# Patient Record
Sex: Female | Born: 1975 | Race: White | Hispanic: No | Smoking: Current every day smoker
Health system: Southern US, Community
[De-identification: ages and names within clinical notes are randomized; demographics above are authoritative.]

## PROBLEM LIST (undated history)

## (undated) DIAGNOSIS — F1910 Other psychoactive substance abuse, uncomplicated: Secondary | ICD-10-CM

## (undated) DIAGNOSIS — F329 Major depressive disorder, single episode, unspecified: Secondary | ICD-10-CM

## (undated) DIAGNOSIS — F909 Attention-deficit hyperactivity disorder, unspecified type: Secondary | ICD-10-CM

## (undated) DIAGNOSIS — F419 Anxiety disorder, unspecified: Secondary | ICD-10-CM

## (undated) DIAGNOSIS — K3184 Gastroparesis: Secondary | ICD-10-CM

## (undated) DIAGNOSIS — F32A Depression, unspecified: Secondary | ICD-10-CM

## (undated) HISTORY — PX: CHOLECYSTECTOMY: SHX55

## (undated) HISTORY — PX: TUBAL LIGATION: SHX77

---

## 1998-04-02 ENCOUNTER — Emergency Department (HOSPITAL_COMMUNITY): Admission: EM | Admit: 1998-04-02 | Discharge: 1998-04-02 | Payer: Self-pay | Admitting: Emergency Medicine

## 1998-04-14 ENCOUNTER — Emergency Department (HOSPITAL_COMMUNITY): Admission: EM | Admit: 1998-04-14 | Discharge: 1998-04-14 | Payer: Self-pay | Admitting: *Deleted

## 1998-04-15 ENCOUNTER — Observation Stay (HOSPITAL_COMMUNITY): Admission: AD | Admit: 1998-04-15 | Discharge: 1998-04-16 | Payer: Self-pay | Admitting: *Deleted

## 1998-11-21 ENCOUNTER — Other Ambulatory Visit: Admission: RE | Admit: 1998-11-21 | Discharge: 1998-11-21 | Payer: Self-pay | Admitting: Obstetrics and Gynecology

## 1999-04-25 ENCOUNTER — Inpatient Hospital Stay (HOSPITAL_COMMUNITY): Admission: AD | Admit: 1999-04-25 | Discharge: 1999-04-25 | Payer: Self-pay | Admitting: Obstetrics and Gynecology

## 1999-05-13 ENCOUNTER — Inpatient Hospital Stay (HOSPITAL_COMMUNITY): Admission: AD | Admit: 1999-05-13 | Discharge: 1999-05-15 | Payer: Self-pay | Admitting: Obstetrics and Gynecology

## 1999-06-19 ENCOUNTER — Other Ambulatory Visit: Admission: RE | Admit: 1999-06-19 | Discharge: 1999-06-19 | Payer: Self-pay | Admitting: Obstetrics and Gynecology

## 2000-06-18 ENCOUNTER — Other Ambulatory Visit: Admission: RE | Admit: 2000-06-18 | Discharge: 2000-06-18 | Payer: Self-pay | Admitting: Obstetrics and Gynecology

## 2000-06-22 ENCOUNTER — Encounter: Payer: Self-pay | Admitting: Internal Medicine

## 2000-06-22 ENCOUNTER — Emergency Department (HOSPITAL_COMMUNITY): Admission: EM | Admit: 2000-06-22 | Discharge: 2000-06-22 | Payer: Self-pay | Admitting: Internal Medicine

## 2001-02-16 ENCOUNTER — Encounter: Payer: Self-pay | Admitting: Emergency Medicine

## 2001-02-16 ENCOUNTER — Emergency Department (HOSPITAL_COMMUNITY): Admission: EM | Admit: 2001-02-16 | Discharge: 2001-02-16 | Payer: Self-pay | Admitting: Emergency Medicine

## 2001-12-09 ENCOUNTER — Emergency Department (HOSPITAL_COMMUNITY): Admission: EM | Admit: 2001-12-09 | Discharge: 2001-12-09 | Payer: Self-pay | Admitting: Emergency Medicine

## 2002-02-21 ENCOUNTER — Emergency Department (HOSPITAL_COMMUNITY): Admission: EM | Admit: 2002-02-21 | Discharge: 2002-02-21 | Payer: Self-pay | Admitting: Emergency Medicine

## 2002-05-10 ENCOUNTER — Emergency Department (HOSPITAL_COMMUNITY): Admission: EM | Admit: 2002-05-10 | Discharge: 2002-05-10 | Payer: Self-pay | Admitting: Emergency Medicine

## 2002-06-21 ENCOUNTER — Emergency Department (HOSPITAL_COMMUNITY): Admission: EM | Admit: 2002-06-21 | Discharge: 2002-06-21 | Payer: Self-pay | Admitting: Emergency Medicine

## 2002-06-21 ENCOUNTER — Encounter: Payer: Self-pay | Admitting: Emergency Medicine

## 2002-11-09 ENCOUNTER — Other Ambulatory Visit: Admission: RE | Admit: 2002-11-09 | Discharge: 2002-11-09 | Payer: Self-pay | Admitting: Obstetrics and Gynecology

## 2003-03-08 ENCOUNTER — Inpatient Hospital Stay (HOSPITAL_COMMUNITY): Admission: AD | Admit: 2003-03-08 | Discharge: 2003-03-08 | Payer: Self-pay | Admitting: Obstetrics and Gynecology

## 2003-05-16 ENCOUNTER — Encounter: Payer: Self-pay | Admitting: *Deleted

## 2003-05-16 ENCOUNTER — Ambulatory Visit (HOSPITAL_COMMUNITY): Admission: RE | Admit: 2003-05-16 | Discharge: 2003-05-16 | Payer: Self-pay | Admitting: *Deleted

## 2003-05-23 ENCOUNTER — Inpatient Hospital Stay (HOSPITAL_COMMUNITY): Admission: AD | Admit: 2003-05-23 | Discharge: 2003-05-23 | Payer: Self-pay | Admitting: *Deleted

## 2003-05-28 ENCOUNTER — Inpatient Hospital Stay (HOSPITAL_COMMUNITY): Admission: AD | Admit: 2003-05-28 | Discharge: 2003-05-28 | Payer: Self-pay | Admitting: Obstetrics and Gynecology

## 2003-06-02 ENCOUNTER — Inpatient Hospital Stay (HOSPITAL_COMMUNITY): Admission: AD | Admit: 2003-06-02 | Discharge: 2003-06-04 | Payer: Self-pay | Admitting: Obstetrics and Gynecology

## 2003-06-03 ENCOUNTER — Encounter (INDEPENDENT_AMBULATORY_CARE_PROVIDER_SITE_OTHER): Payer: Self-pay | Admitting: *Deleted

## 2003-07-17 ENCOUNTER — Other Ambulatory Visit: Admission: RE | Admit: 2003-07-17 | Discharge: 2003-07-17 | Payer: Self-pay | Admitting: Obstetrics and Gynecology

## 2004-08-30 ENCOUNTER — Other Ambulatory Visit: Admission: RE | Admit: 2004-08-30 | Discharge: 2004-08-30 | Payer: Self-pay | Admitting: Obstetrics and Gynecology

## 2006-04-30 ENCOUNTER — Emergency Department (HOSPITAL_COMMUNITY): Admission: EM | Admit: 2006-04-30 | Discharge: 2006-04-30 | Payer: Self-pay | Admitting: Emergency Medicine

## 2009-01-10 ENCOUNTER — Emergency Department (HOSPITAL_COMMUNITY): Admission: EM | Admit: 2009-01-10 | Discharge: 2009-01-10 | Payer: Self-pay | Admitting: Emergency Medicine

## 2009-01-17 ENCOUNTER — Emergency Department (HOSPITAL_COMMUNITY): Admission: EM | Admit: 2009-01-17 | Discharge: 2009-01-17 | Payer: Self-pay | Admitting: Family Medicine

## 2010-01-15 ENCOUNTER — Emergency Department (HOSPITAL_COMMUNITY): Admission: EM | Admit: 2010-01-15 | Discharge: 2010-01-15 | Payer: Self-pay | Admitting: Family Medicine

## 2010-02-22 ENCOUNTER — Emergency Department (HOSPITAL_COMMUNITY): Admission: EM | Admit: 2010-02-22 | Discharge: 2010-02-22 | Payer: Self-pay | Admitting: Emergency Medicine

## 2010-02-24 ENCOUNTER — Emergency Department (HOSPITAL_COMMUNITY): Admission: EM | Admit: 2010-02-24 | Discharge: 2010-02-25 | Payer: Self-pay | Admitting: Emergency Medicine

## 2010-03-24 ENCOUNTER — Emergency Department (HOSPITAL_COMMUNITY): Admission: EM | Admit: 2010-03-24 | Discharge: 2010-03-24 | Payer: Self-pay | Admitting: Emergency Medicine

## 2010-03-30 ENCOUNTER — Emergency Department (HOSPITAL_COMMUNITY): Admission: EM | Admit: 2010-03-30 | Discharge: 2010-03-30 | Payer: Self-pay | Admitting: Emergency Medicine

## 2010-04-04 ENCOUNTER — Emergency Department (HOSPITAL_COMMUNITY): Admission: EM | Admit: 2010-04-04 | Discharge: 2010-04-04 | Payer: Self-pay | Admitting: Emergency Medicine

## 2010-05-03 ENCOUNTER — Emergency Department (HOSPITAL_COMMUNITY): Admission: EM | Admit: 2010-05-03 | Discharge: 2010-05-03 | Payer: Self-pay | Admitting: Emergency Medicine

## 2010-05-04 ENCOUNTER — Ambulatory Visit: Payer: Self-pay | Admitting: Psychiatry

## 2010-05-04 ENCOUNTER — Inpatient Hospital Stay (HOSPITAL_COMMUNITY): Admission: AD | Admit: 2010-05-04 | Discharge: 2010-05-08 | Payer: Self-pay | Admitting: Psychiatry

## 2010-07-04 ENCOUNTER — Inpatient Hospital Stay (HOSPITAL_COMMUNITY): Admission: EM | Admit: 2010-07-04 | Discharge: 2010-07-05 | Payer: Self-pay | Admitting: Emergency Medicine

## 2010-07-05 ENCOUNTER — Encounter (INDEPENDENT_AMBULATORY_CARE_PROVIDER_SITE_OTHER): Payer: Self-pay | Admitting: General Surgery

## 2010-08-06 ENCOUNTER — Emergency Department (HOSPITAL_COMMUNITY)
Admission: EM | Admit: 2010-08-06 | Discharge: 2010-08-06 | Payer: Self-pay | Source: Home / Self Care | Admitting: Emergency Medicine

## 2010-10-28 LAB — URINE MICROSCOPIC-ADD ON

## 2010-10-28 LAB — BASIC METABOLIC PANEL
BUN: 2 mg/dL — ABNORMAL LOW (ref 6–23)
CO2: 26 mEq/L (ref 19–32)
Calcium: 9.3 mg/dL (ref 8.4–10.5)
Creatinine, Ser: 0.84 mg/dL (ref 0.4–1.2)
GFR calc Af Amer: >60 mL/min (ref >60)

## 2010-10-28 LAB — URINALYSIS, ROUTINE W REFLEX MICROSCOPIC
Glucose, UA: NEGATIVE mg/dL
Hgb urine dipstick: NEGATIVE
Ketones, ur: NEGATIVE mg/dL
Nitrite: NEGATIVE
Specific Gravity, Urine: 1.025 (ref 1.005–1.030)

## 2010-10-28 LAB — RAPID URINE DRUG SCREEN, HOSP PERFORMED
Barbiturates: NOT DETECTED
Benzodiazepines: POSITIVE — AB
Tetrahydrocannabinol: POSITIVE — AB

## 2010-10-28 LAB — DIFFERENTIAL
Basophils Relative: 0 % (ref 0–1)
Eosinophils Absolute: 0.1 10*3/uL (ref 0.0–0.7)
Eosinophils Relative: 1 % (ref 0–5)
Lymphs Abs: 3.4 10*3/uL (ref 0.7–4.0)
Monocytes Absolute: 0.6 10*3/uL (ref 0.1–1.0)
Neutro Abs: 6.1 10*3/uL (ref 1.7–7.7)

## 2010-10-28 LAB — CBC
MCH: 30.9 pg (ref 26.0–34.0)
MCHC: 34.7 g/dL (ref 30.0–36.0)
RDW: 12.8 % (ref 11.5–15.5)

## 2010-10-28 LAB — ETHANOL: Alcohol, Ethyl (B): <5 mg/dL (ref 0–10)

## 2010-10-29 LAB — DIFFERENTIAL
Basophils Absolute: 0.1 10*3/uL (ref 0.0–0.1)
Basophils Relative: 1 % (ref 0–1)
Basophils Relative: 1 % (ref 0–1)
Lymphs Abs: 3.1 10*3/uL (ref 0.7–4.0)
Monocytes Absolute: 0.7 10*3/uL (ref 0.1–1.0)
Monocytes Absolute: 0.7 10*3/uL (ref 0.1–1.0)
Monocytes Relative: 6 % (ref 3–12)
Neutro Abs: 5.8 10*3/uL (ref 1.7–7.7)
Neutro Abs: 6.6 10*3/uL (ref 1.7–7.7)
Neutrophils Relative %: 63 % (ref 43–77)

## 2010-10-29 LAB — BASIC METABOLIC PANEL
BUN: 6 mg/dL (ref 6–23)
Calcium: 8.6 mg/dL (ref 8.4–10.5)
Creatinine, Ser: 0.77 mg/dL (ref 0.4–1.2)
GFR calc Af Amer: >60 mL/min (ref >60)
GFR calc non Af Amer: >60 mL/min (ref >60)
Potassium: 4.3 mEq/L (ref 3.5–5.1)

## 2010-10-29 LAB — CBC
HCT: 34.2 % — ABNORMAL LOW (ref 36.0–46.0)
HCT: 38 % (ref 36.0–46.0)
MCH: 31 pg (ref 26.0–34.0)
MCH: 31.2 pg (ref 26.0–34.0)
MCHC: 33 g/dL (ref 30.0–36.0)
MCV: 94 fL (ref 78.0–100.0)
Platelets: 249 10*3/uL (ref 150–400)
RDW: 13.4 % (ref 11.5–15.5)
WBC: 10.6 10*3/uL — ABNORMAL HIGH (ref 4.0–10.5)

## 2010-10-29 LAB — COMPREHENSIVE METABOLIC PANEL
ALT: 33 U/L (ref 0–35)
AST: 24 U/L (ref 0–37)
Alkaline Phosphatase: 65 U/L (ref 39–117)
Chloride: 98 mEq/L (ref 96–112)
GFR calc Af Amer: >60 mL/min (ref >60)
Sodium: 137 mEq/L (ref 135–145)

## 2010-10-29 LAB — URINALYSIS, ROUTINE W REFLEX MICROSCOPIC
Protein, ur: NEGATIVE mg/dL
Specific Gravity, Urine: 1.01 (ref 1.005–1.030)
pH: 5.5 (ref 5.0–8.0)

## 2010-10-29 LAB — LIPASE, BLOOD: Lipase: 25 U/L (ref 11–59)

## 2010-10-29 LAB — URINE MICROSCOPIC-ADD ON

## 2010-10-31 LAB — POCT I-STAT, CHEM 8
BUN: 5 mg/dL — ABNORMAL LOW (ref 6–23)
Calcium, Ion: 1.22 mmol/L (ref 1.12–1.32)
Creatinine, Ser: 0.9 mg/dL (ref 0.4–1.2)
TCO2: 29 mmol/L (ref 0–100)

## 2010-10-31 LAB — RAPID URINE DRUG SCREEN, HOSP PERFORMED
Barbiturates: NOT DETECTED
Benzodiazepines: POSITIVE — AB
Cocaine: POSITIVE — AB

## 2010-11-01 LAB — COMPREHENSIVE METABOLIC PANEL
ALT: 21 U/L (ref 0–35)
AST: 21 U/L (ref 0–37)
Albumin: 3.6 g/dL (ref 3.5–5.2)
Alkaline Phosphatase: 67 U/L (ref 39–117)
Calcium: 8.7 mg/dL (ref 8.4–10.5)
GFR calc Af Amer: >60 mL/min (ref >60)
Glucose, Bld: 101 mg/dL — ABNORMAL HIGH (ref 70–99)
Potassium: 3.9 mEq/L (ref 3.5–5.1)
Sodium: 138 mEq/L (ref 135–145)
Total Protein: 6.5 g/dL (ref 6.0–8.3)

## 2010-11-01 LAB — CBC
HCT: 36.2 % (ref 36.0–46.0)
RDW: 13.4 % (ref 11.5–15.5)
WBC: 7.9 10*3/uL (ref 4.0–10.5)

## 2010-11-01 LAB — URINALYSIS, ROUTINE W REFLEX MICROSCOPIC
Bilirubin Urine: NEGATIVE
Glucose, UA: NEGATIVE mg/dL
Ketones, ur: NEGATIVE mg/dL
Specific Gravity, Urine: 1.016 (ref 1.005–1.030)
pH: 5.5 (ref 5.0–8.0)

## 2010-11-01 LAB — URINE MICROSCOPIC-ADD ON

## 2010-11-01 LAB — DIFFERENTIAL
Basophils Relative: 1 % (ref 0–1)
Eosinophils Absolute: 0.1 10*3/uL (ref 0.0–0.7)
Eosinophils Relative: 2 % (ref 0–5)
Lymphs Abs: 3.2 10*3/uL (ref 0.7–4.0)
Monocytes Absolute: 0.5 10*3/uL (ref 0.1–1.0)
Monocytes Relative: 7 % (ref 3–12)

## 2010-11-03 LAB — DIFFERENTIAL
Basophils Absolute: 0 10*3/uL (ref 0.0–0.1)
Basophils Relative: 0 % (ref 0–1)
Eosinophils Absolute: 0.1 10*3/uL (ref 0.0–0.7)
Eosinophils Relative: 1 % (ref 0–5)
Monocytes Absolute: 0.8 10*3/uL (ref 0.1–1.0)

## 2010-11-03 LAB — URINALYSIS, ROUTINE W REFLEX MICROSCOPIC
Bilirubin Urine: NEGATIVE
Bilirubin Urine: NEGATIVE
Glucose, UA: NEGATIVE mg/dL
Hgb urine dipstick: NEGATIVE
Ketones, ur: NEGATIVE mg/dL
Ketones, ur: NEGATIVE mg/dL
Nitrite: NEGATIVE
Nitrite: NEGATIVE
Specific Gravity, Urine: 1.012 (ref 1.005–1.030)
Specific Gravity, Urine: 1.021 (ref 1.005–1.030)
Urobilinogen, UA: 0.2 mg/dL (ref 0.0–1.0)
pH: 7 (ref 5.0–8.0)
pH: 7 (ref 5.0–8.0)

## 2010-11-03 LAB — URINE MICROSCOPIC-ADD ON

## 2010-11-03 LAB — COMPREHENSIVE METABOLIC PANEL
ALT: 71 U/L — ABNORMAL HIGH (ref 0–35)
AST: 43 U/L — ABNORMAL HIGH (ref 0–37)
Albumin: 3.7 g/dL (ref 3.5–5.2)
Alkaline Phosphatase: 59 U/L (ref 39–117)
CO2: 32 mEq/L (ref 19–32)
Chloride: 99 mEq/L (ref 96–112)
GFR calc Af Amer: >60 mL/min (ref >60)
GFR calc non Af Amer: >60 mL/min (ref >60)
Potassium: 3.1 mEq/L — ABNORMAL LOW (ref 3.5–5.1)
Total Bilirubin: 0.7 mg/dL (ref 0.3–1.2)

## 2010-11-03 LAB — POCT PREGNANCY, URINE
Preg Test, Ur: NEGATIVE
Preg Test, Ur: NEGATIVE

## 2010-11-03 LAB — CBC
Hemoglobin: 14.6 g/dL (ref 12.0–15.0)
Platelets: 269 10*3/uL (ref 150–400)
RBC: 4.57 MIL/uL (ref 3.87–5.11)
WBC: 11.6 10*3/uL — ABNORMAL HIGH (ref 4.0–10.5)

## 2010-11-08 ENCOUNTER — Emergency Department (HOSPITAL_COMMUNITY)
Admission: EM | Admit: 2010-11-08 | Discharge: 2010-11-09 | Disposition: A | Discharge status: Home / Self Care | Payer: Self-pay | Attending: Emergency Medicine | Admitting: Emergency Medicine

## 2010-11-08 DIAGNOSIS — F319 Bipolar disorder, unspecified: Secondary | ICD-10-CM | POA: Insufficient documentation

## 2010-11-08 DIAGNOSIS — L989 Disorder of the skin and subcutaneous tissue, unspecified: Secondary | ICD-10-CM | POA: Insufficient documentation

## 2010-11-08 DIAGNOSIS — L02419 Cutaneous abscess of limb, unspecified: Secondary | ICD-10-CM | POA: Insufficient documentation

## 2010-11-08 DIAGNOSIS — L03119 Cellulitis of unspecified part of limb: Secondary | ICD-10-CM | POA: Insufficient documentation

## 2010-11-08 DIAGNOSIS — F411 Generalized anxiety disorder: Secondary | ICD-10-CM | POA: Insufficient documentation

## 2010-11-08 DIAGNOSIS — E785 Hyperlipidemia, unspecified: Secondary | ICD-10-CM | POA: Insufficient documentation

## 2010-11-11 LAB — WOUND CULTURE

## 2010-11-12 ENCOUNTER — Emergency Department (HOSPITAL_COMMUNITY): Payer: Self-pay

## 2010-11-12 ENCOUNTER — Emergency Department (HOSPITAL_COMMUNITY)
Admission: EM | Admit: 2010-11-12 | Discharge: 2010-11-12 | Disposition: A | Discharge status: Home / Self Care | Payer: Self-pay | Attending: Emergency Medicine | Admitting: Emergency Medicine

## 2010-11-12 DIAGNOSIS — L02419 Cutaneous abscess of limb, unspecified: Secondary | ICD-10-CM | POA: Insufficient documentation

## 2010-11-12 DIAGNOSIS — M79609 Pain in unspecified limb: Secondary | ICD-10-CM | POA: Insufficient documentation

## 2010-11-12 DIAGNOSIS — Z79899 Other long term (current) drug therapy: Secondary | ICD-10-CM | POA: Insufficient documentation

## 2010-11-12 DIAGNOSIS — E785 Hyperlipidemia, unspecified: Secondary | ICD-10-CM | POA: Insufficient documentation

## 2010-11-12 DIAGNOSIS — T6391XA Toxic effect of contact with unspecified venomous animal, accidental (unintentional), initial encounter: Secondary | ICD-10-CM | POA: Insufficient documentation

## 2010-11-12 DIAGNOSIS — T63391A Toxic effect of venom of other spider, accidental (unintentional), initial encounter: Secondary | ICD-10-CM | POA: Insufficient documentation

## 2010-11-12 DIAGNOSIS — F319 Bipolar disorder, unspecified: Secondary | ICD-10-CM | POA: Insufficient documentation

## 2010-11-20 ENCOUNTER — Emergency Department (HOSPITAL_COMMUNITY)
Admission: EM | Admit: 2010-11-20 | Discharge: 2010-11-21 | Disposition: A | Discharge status: Home / Self Care | Payer: No Typology Code available for payment source | Attending: Emergency Medicine | Admitting: Emergency Medicine

## 2010-11-20 DIAGNOSIS — R51 Headache: Secondary | ICD-10-CM | POA: Insufficient documentation

## 2010-11-20 DIAGNOSIS — M549 Dorsalgia, unspecified: Secondary | ICD-10-CM | POA: Insufficient documentation

## 2010-11-20 DIAGNOSIS — F988 Other specified behavioral and emotional disorders with onset usually occurring in childhood and adolescence: Secondary | ICD-10-CM | POA: Insufficient documentation

## 2010-12-18 ENCOUNTER — Emergency Department (HOSPITAL_COMMUNITY)
Admission: EM | Admit: 2010-12-18 | Discharge: 2010-12-18 | Disposition: A | Discharge status: Home / Self Care | Payer: No Typology Code available for payment source | Attending: Emergency Medicine | Admitting: Emergency Medicine

## 2010-12-18 DIAGNOSIS — F988 Other specified behavioral and emotional disorders with onset usually occurring in childhood and adolescence: Secondary | ICD-10-CM | POA: Insufficient documentation

## 2010-12-18 DIAGNOSIS — IMO0002 Reserved for concepts with insufficient information to code with codable children: Secondary | ICD-10-CM | POA: Insufficient documentation

## 2010-12-18 DIAGNOSIS — M7989 Other specified soft tissue disorders: Secondary | ICD-10-CM | POA: Insufficient documentation

## 2010-12-18 DIAGNOSIS — Z79899 Other long term (current) drug therapy: Secondary | ICD-10-CM | POA: Insufficient documentation

## 2010-12-18 DIAGNOSIS — M79609 Pain in unspecified limb: Secondary | ICD-10-CM | POA: Insufficient documentation

## 2011-01-03 NOTE — Op Note (Signed)
   NAME:  Amanda Carrillo, VARANO                     ACCOUNT NO.:  [id number]   MEDICAL RECORD NO.:  [id number]                   PATIENT TYPE:  INP   LOCATION:  9115                                 FACILITY:  [facility]   PHYSICIAN:  Duke Salvia. Marcelle Overlie, M.D.            DATE OF BIRTH:  [birth year]   DATE OF PROCEDURE:  06/03/2003  DATE OF DISCHARGE:                                 OPERATIVE REPORT   PREOPERATIVE DIAGNOSIS:  Requests permanent sterilization.   POSTOPERATIVE DIAGNOSIS:  Requests permanent sterilization.   PROCEDURE:  Modified [place] postpartum tubal ligation.   SURGEON:  Duke Salvia. Marcelle Overlie, M.D.   ANESTHESIA:  Epidural plus local.   COMPLICATIONS:  None.   ESTIMATED BLOOD LOSS:  Less than 5 mL.   PROCEDURE AND FINDINGS:  The patient taken to the operating room.  After an  adequate level of epidural anesthesia was obtained, the periumbilical area  was prepped and draped.  This was supplemented with 0.5% Marcaine plain into  the subumbilical area.  A small incision was made between two Allis clamps,  carried down through fascia and peritoneum individually.  Army[employer]y  retractors were then positioned.  Babcock clamps used to isolate the right  tube, which was traced to the fimbriated end, re-grasped in the midsegment.  Marcaine 0.5% 4-5 mL was then dripped across the tube.  A knuckle of tube  was then resected after tying with 0 plain ties.  Cut ends were cauterized  with the Bovie and a 2-0 silk suture placed around the proximal segment.  This was hemostatic.  The exact same was repeated on the opposite side after  carefully identifying the tube.  These areas were hemostatic.  Prior to  closure, the sponge, needle, and instrument counts were reported as correct  x2.  The fascia was closed with a 2-0 Dexon running suture and a 4-0 Vicryl  Rapide on the subcuticular.  She tolerated this well and went to the  recovery room in good condition.                  Richard M. Marcelle Overlie, M.D.    RMH/MEDQ  D:  06/03/2003  T:  06/03/2003  Job:  [id number]

## 2011-02-21 ENCOUNTER — Emergency Department (HOSPITAL_COMMUNITY)
Admission: EM | Admit: 2011-02-21 | Discharge: 2011-02-22 | Disposition: A | Discharge status: Home / Self Care | Payer: Self-pay | Attending: Emergency Medicine | Admitting: Emergency Medicine

## 2011-02-21 DIAGNOSIS — R109 Unspecified abdominal pain: Secondary | ICD-10-CM | POA: Insufficient documentation

## 2011-02-21 DIAGNOSIS — R112 Nausea with vomiting, unspecified: Secondary | ICD-10-CM | POA: Insufficient documentation

## 2011-02-21 LAB — DIFFERENTIAL
Eosinophils Absolute: 0 10*3/uL (ref 0.0–0.7)
Lymphs Abs: 2.1 10*3/uL (ref 0.7–4.0)
Neutro Abs: 11.6 10*3/uL — ABNORMAL HIGH (ref 1.7–7.7)
Neutrophils Relative %: 80 % — ABNORMAL HIGH (ref 43–77)

## 2011-02-21 LAB — CBC
Hemoglobin: 12.8 g/dL (ref 12.0–15.0)
MCV: 89.7 fL (ref 78.0–100.0)
Platelets: 205 10*3/uL (ref 150–400)
RBC: 4.35 MIL/uL (ref 3.87–5.11)
WBC: 14.5 10*3/uL — ABNORMAL HIGH (ref 4.0–10.5)

## 2011-02-22 ENCOUNTER — Emergency Department (HOSPITAL_COMMUNITY): Payer: Self-pay

## 2011-02-22 ENCOUNTER — Emergency Department (HOSPITAL_COMMUNITY)
Admission: EM | Admit: 2011-02-22 | Discharge: 2011-02-22 | Disposition: A | Discharge status: Home / Self Care | Payer: Self-pay | Attending: Emergency Medicine | Admitting: Emergency Medicine

## 2011-02-22 DIAGNOSIS — F19939 Other psychoactive substance use, unspecified with withdrawal, unspecified: Secondary | ICD-10-CM | POA: Insufficient documentation

## 2011-02-22 DIAGNOSIS — R109 Unspecified abdominal pain: Secondary | ICD-10-CM | POA: Insufficient documentation

## 2011-02-22 DIAGNOSIS — R112 Nausea with vomiting, unspecified: Secondary | ICD-10-CM | POA: Insufficient documentation

## 2011-02-22 DIAGNOSIS — F1920 Other psychoactive substance dependence, uncomplicated: Secondary | ICD-10-CM | POA: Insufficient documentation

## 2011-02-22 LAB — COMPREHENSIVE METABOLIC PANEL
ALT: 30 U/L (ref 0–35)
AST: 14 U/L (ref 0–37)
AST: 19 U/L (ref 0–37)
Albumin: 3.5 g/dL (ref 3.5–5.2)
Albumin: 3.3 g/dL — ABNORMAL LOW (ref 3.5–5.2)
Alkaline Phosphatase: 70 U/L (ref 39–117)
CO2: 27 mEq/L (ref 19–32)
Calcium: 8.1 mg/dL — ABNORMAL LOW (ref 8.4–10.5)
Chloride: 105 mEq/L (ref 96–112)
Creatinine, Ser: 0.7 mg/dL (ref 0.50–1.10)
GFR calc non Af Amer: >60 mL/min (ref >60)
Potassium: 3.1 mEq/L — ABNORMAL LOW (ref 3.5–5.1)
Sodium: 139 mEq/L (ref 135–145)
Sodium: 134 mEq/L — ABNORMAL LOW (ref 135–145)
Total Bilirubin: 0.5 mg/dL (ref 0.3–1.2)
Total Protein: 6.6 g/dL (ref 6.0–8.3)
Total Protein: 6.5 g/dL (ref 6.0–8.3)

## 2011-02-22 LAB — URINALYSIS, ROUTINE W REFLEX MICROSCOPIC
Glucose, UA: NEGATIVE mg/dL
Ketones, ur: 40 mg/dL — AB
Ketones, ur: 15 mg/dL — AB
Leukocytes, UA: NEGATIVE
Leukocytes, UA: NEGATIVE
Nitrite: NEGATIVE
Nitrite: NEGATIVE
Protein, ur: NEGATIVE mg/dL
Specific Gravity, Urine: 1.025 (ref 1.005–1.030)
Urobilinogen, UA: 0.2 mg/dL (ref 0.0–1.0)
pH: 6 (ref 5.0–8.0)

## 2011-02-22 LAB — DIFFERENTIAL
Basophils Absolute: 0 10*3/uL (ref 0.0–0.1)
Basophils Relative: 0 % (ref 0–1)
Eosinophils Absolute: 0 10*3/uL (ref 0.0–0.7)
Monocytes Absolute: 0.8 10*3/uL (ref 0.1–1.0)
Monocytes Relative: 6 % (ref 3–12)
Neutro Abs: 10 10*3/uL — ABNORMAL HIGH (ref 1.7–7.7)
Neutrophils Relative %: 76 % (ref 43–77)

## 2011-02-22 LAB — CBC
Hemoglobin: 13.3 g/dL (ref 12.0–15.0)
MCH: 30.2 pg (ref 26.0–34.0)
MCHC: 33.6 g/dL (ref 30.0–36.0)
Platelets: 189 10*3/uL (ref 150–400)

## 2011-02-22 LAB — WET PREP, GENITAL: Trich, Wet Prep: NONE SEEN

## 2011-02-22 LAB — LIPASE, BLOOD: Lipase: 19 U/L (ref 11–59)

## 2011-02-22 MED ORDER — IOHEXOL 300 MG/ML  SOLN
125.0000 mL | Freq: Once | INTRAMUSCULAR | Status: AC | PRN
  Administered 2011-02-22: 125 mL via INTRAVENOUS

## 2011-02-23 ENCOUNTER — Emergency Department (HOSPITAL_COMMUNITY): Payer: Self-pay

## 2011-02-23 ENCOUNTER — Emergency Department (HOSPITAL_COMMUNITY)
Admission: EM | Admit: 2011-02-23 | Discharge: 2011-02-23 | Disposition: A | Discharge status: Home / Self Care | Payer: Self-pay | Attending: Emergency Medicine | Admitting: Emergency Medicine

## 2011-02-23 DIAGNOSIS — R112 Nausea with vomiting, unspecified: Secondary | ICD-10-CM | POA: Insufficient documentation

## 2011-02-23 DIAGNOSIS — E876 Hypokalemia: Secondary | ICD-10-CM | POA: Insufficient documentation

## 2011-02-23 DIAGNOSIS — R109 Unspecified abdominal pain: Secondary | ICD-10-CM | POA: Insufficient documentation

## 2011-02-23 LAB — DIFFERENTIAL
Basophils Absolute: 0 10*3/uL (ref 0.0–0.1)
Basophils Relative: 0 % (ref 0–1)
Eosinophils Absolute: 0 10*3/uL (ref 0.0–0.7)
Eosinophils Relative: 0 % (ref 0–5)
Lymphocytes Relative: 23 % (ref 12–46)
Lymphs Abs: 2.7 10*3/uL (ref 0.7–4.0)
Monocytes Absolute: 0.8 10*3/uL (ref 0.1–1.0)
Monocytes Relative: 6 % (ref 3–12)
Neutro Abs: 8.2 10*3/uL — ABNORMAL HIGH (ref 1.7–7.7)
Neutrophils Relative %: 70 % (ref 43–77)

## 2011-02-23 LAB — URINALYSIS, ROUTINE W REFLEX MICROSCOPIC
Bilirubin Urine: NEGATIVE
Glucose, UA: NEGATIVE mg/dL
Hgb urine dipstick: NEGATIVE
Ketones, ur: 15 mg/dL — AB
Leukocytes, UA: NEGATIVE
Nitrite: NEGATIVE
Protein, ur: NEGATIVE mg/dL
Specific Gravity, Urine: 1.013 (ref 1.005–1.030)
Urobilinogen, UA: 0.2 mg/dL (ref 0.0–1.0)
pH: 6.5 (ref 5.0–8.0)

## 2011-02-23 LAB — CBC
Hemoglobin: 14.4 g/dL (ref 12.0–15.0)
MCV: 87.6 fL (ref 78.0–100.0)
Platelets: 227 10*3/uL (ref 150–400)
RBC: 4.74 MIL/uL (ref 3.87–5.11)
WBC: 11.7 10*3/uL — ABNORMAL HIGH (ref 4.0–10.5)

## 2011-02-23 LAB — COMPREHENSIVE METABOLIC PANEL
ALT: 85 U/L — ABNORMAL HIGH (ref 0–35)
AST: 62 U/L — ABNORMAL HIGH (ref 0–37)
CO2: 26 mEq/L (ref 19–32)
Chloride: 97 mEq/L (ref 96–112)
GFR calc non Af Amer: >60 mL/min (ref >60)
Sodium: 135 mEq/L (ref 135–145)
Total Bilirubin: 0.8 mg/dL (ref 0.3–1.2)

## 2011-02-24 ENCOUNTER — Inpatient Hospital Stay (HOSPITAL_COMMUNITY)
Admission: EM | Admit: 2011-02-24 | Discharge: 2011-02-26 | DRG: 392 | Disposition: A | Discharge status: Home / Self Care | Payer: Self-pay | Attending: Internal Medicine | Admitting: Internal Medicine

## 2011-02-24 DIAGNOSIS — F988 Other specified behavioral and emotional disorders with onset usually occurring in childhood and adolescence: Secondary | ICD-10-CM | POA: Diagnosis present

## 2011-02-24 DIAGNOSIS — F1411 Cocaine abuse, in remission: Secondary | ICD-10-CM | POA: Diagnosis present

## 2011-02-24 DIAGNOSIS — K5289 Other specified noninfective gastroenteritis and colitis: Principal | ICD-10-CM | POA: Diagnosis present

## 2011-02-24 DIAGNOSIS — R109 Unspecified abdominal pain: Secondary | ICD-10-CM | POA: Diagnosis present

## 2011-02-24 DIAGNOSIS — F17200 Nicotine dependence, unspecified, uncomplicated: Secondary | ICD-10-CM | POA: Diagnosis present

## 2011-02-25 ENCOUNTER — Emergency Department (HOSPITAL_COMMUNITY): Payer: Self-pay

## 2011-02-25 LAB — COMPREHENSIVE METABOLIC PANEL
AST: 50 U/L — ABNORMAL HIGH (ref 0–37)
Albumin: 3.6 g/dL (ref 3.5–5.2)
Calcium: 8.8 mg/dL (ref 8.4–10.5)
Chloride: 99 mEq/L (ref 96–112)
Creatinine, Ser: 0.74 mg/dL (ref 0.50–1.10)
Total Bilirubin: 0.6 mg/dL (ref 0.3–1.2)
Total Protein: 6.9 g/dL (ref 6.0–8.3)

## 2011-02-25 LAB — CBC
MCV: 86.9 fL (ref 78.0–100.0)
Platelets: 235 10*3/uL (ref 150–400)
RDW: 13.3 % (ref 11.5–15.5)
WBC: 13.3 10*3/uL — ABNORMAL HIGH (ref 4.0–10.5)

## 2011-02-25 LAB — URINE MICROSCOPIC-ADD ON

## 2011-02-25 LAB — URINALYSIS, ROUTINE W REFLEX MICROSCOPIC
Glucose, UA: NEGATIVE mg/dL
Specific Gravity, Urine: 1.017 (ref 1.005–1.030)
pH: 6.5 (ref 5.0–8.0)

## 2011-02-25 LAB — TSH: TSH: 5.9 u[IU]/mL — ABNORMAL HIGH (ref 0.350–4.500)

## 2011-02-25 LAB — ETHANOL: Alcohol, Ethyl (B): <11 mg/dL (ref 0–11)

## 2011-02-25 LAB — DIFFERENTIAL
Eosinophils Absolute: 0.1 10*3/uL (ref 0.0–0.7)
Eosinophils Relative: 0 % (ref 0–5)
Lymphs Abs: 2.9 10*3/uL (ref 0.7–4.0)

## 2011-02-26 LAB — CBC
MCV: 87.3 fL (ref 78.0–100.0)
Platelets: 251 10*3/uL (ref 150–400)
RBC: 4.65 MIL/uL (ref 3.87–5.11)
RDW: 13.2 % (ref 11.5–15.5)
WBC: 9.6 10*3/uL (ref 4.0–10.5)

## 2011-02-26 LAB — URINE CULTURE
Colony Count: 75000
Culture  Setup Time: 201207100920

## 2011-02-28 ENCOUNTER — Emergency Department (HOSPITAL_COMMUNITY)
Admission: EM | Admit: 2011-02-28 | Discharge: 2011-02-28 | Disposition: A | Discharge status: Home / Self Care | Payer: Self-pay | Attending: Emergency Medicine | Admitting: Emergency Medicine

## 2011-02-28 DIAGNOSIS — F319 Bipolar disorder, unspecified: Secondary | ICD-10-CM | POA: Insufficient documentation

## 2011-02-28 DIAGNOSIS — F988 Other specified behavioral and emotional disorders with onset usually occurring in childhood and adolescence: Secondary | ICD-10-CM | POA: Insufficient documentation

## 2011-02-28 DIAGNOSIS — Z79899 Other long term (current) drug therapy: Secondary | ICD-10-CM | POA: Insufficient documentation

## 2011-02-28 DIAGNOSIS — R109 Unspecified abdominal pain: Secondary | ICD-10-CM | POA: Insufficient documentation

## 2011-02-28 LAB — COMPREHENSIVE METABOLIC PANEL
ALT: 48 U/L — ABNORMAL HIGH (ref 0–35)
AST: 16 U/L (ref 0–37)
Albumin: 3.6 g/dL (ref 3.5–5.2)
Calcium: 8.8 mg/dL (ref 8.4–10.5)
Creatinine, Ser: 0.56 mg/dL (ref 0.50–1.10)
GFR calc non Af Amer: >60 mL/min (ref >60)
Sodium: 137 mEq/L (ref 135–145)
Total Protein: 6.7 g/dL (ref 6.0–8.3)

## 2011-02-28 LAB — CBC
MCH: 31 pg (ref 26.0–34.0)
MCHC: 35.4 g/dL (ref 30.0–36.0)
MCV: 87.6 fL (ref 78.0–100.0)
Platelets: 244 10*3/uL (ref 150–400)
RBC: 4.45 MIL/uL (ref 3.87–5.11)
RDW: 13.3 % (ref 11.5–15.5)

## 2011-02-28 LAB — DIFFERENTIAL
Basophils Relative: 0 % (ref 0–1)
Eosinophils Absolute: 0.1 10*3/uL (ref 0.0–0.7)
Eosinophils Relative: 1 % (ref 0–5)
Lymphs Abs: 2.5 10*3/uL (ref 0.7–4.0)
Monocytes Absolute: 0.9 10*3/uL (ref 0.1–1.0)
Monocytes Relative: 9 % (ref 3–12)
Neutrophils Relative %: 62 % (ref 43–77)

## 2011-02-28 LAB — POCT PREGNANCY, URINE: Preg Test, Ur: NEGATIVE

## 2011-03-20 NOTE — H&P (Signed)
Amanda Carrillo, ESHAM NO.:  [id number]  MEDICAL RECORD NO.:  [id number]  LOCATION:  [facility]                         FACILITY:  [facility]  PHYSICIAN:  Zannie Cove, MD     DATE OF BIRTH:  29-Mar-1976  DATE OF ADMISSION:  02/24/2011 DATE OF DISCHARGE:                             HISTORY & PHYSICAL   PRIMARY CARE PHYSICIAN:  None.  She is unassigned.  CHIEF COMPLAINT:  Nausea and vomiting.  HISTORY OF PRESENT ILLNESS:  Amanda Carrillo is a 35 year old white female with prior history of heroin abuse who used to be on methadone until 2 weeks ago was here with the above-mentioned complaint.  The patient reports that she used to be on methadone for heroin dependence and then towards the end of June, she stopped taking the methadone because she could not afford it any more and has had intermittent symptoms of nausea and vomiting since stopping this, however, she did okay for about a week after being off methadone after initial 2-3 days of symptoms and this Friday started having severe nausea and vomiting which she describes as pretty much constant with 10-15 times a day without any blood in vomitus.  Her vomitus contains food or clear liquids or greenish bile sometimes.  She also reports constant epigastric pain which radiates down to the rest of her abdomen.  She denies any aggravating or relieving factors.  She does describe the severity as 10/10.  She denies any fevers.  She reports chills.  She denies diarrhea.  She went to the emergency room Friday, [facility] where she had a CT abdomen, pelvis done which was found to be normal.  Subsequently discharged home on Phenergan and Carafate.  She went home, slept, then woke up the next Saturday morning, had recurrence of her symptoms of vomiting and abdominal pain, went back to the ER again, evaluated and sent back home and that on Sunday she started hurting even more, went back to [facility] where she was given some Prilosec  and discharged home and then since she has had persistent symptoms, she came to [facility] Emergency Room today.  Of note she has also had an ultrasound in the ER which is unremarkable for any abnormalities and [facility] were consulted for further evaluation and management.  The patient denies any other illicit drug use in the last 2 weeks.  She denies any alcohol use ever. However, there is a psychiatric admission note from September 2011 which reported that she drinks still, she passes out.  PAST MEDICAL HISTORY:  Significant for: 1. Polysubstance abuse. 2. Depression. 3. History of cholecystectomy. 4. History of tubal ligation.  MEDICATIONS:  Currently taking Phenergan p.r.n.  ALLERGIES:  No known drug allergies.  SOCIAL HISTORY:  Is divorced, lives alone at home.  She has her kids every other weekend.  She denies any history of alcohol use.  Reports smoking 1 pack every 2 days and has been doing so for over 20 years, used to smoke heroin once a week, however, says that she has been sober for a couple of months.  REVIEW OF SYSTEMS:  Negative except per HPI.  PHYSICAL EXAMINATION:  VITAL SIGNS:  Temperature is 97.6, pulse 59, blood pressure 123/81, respirations 18, satting 94% room air. GENERAL:  This is an averagely built white female lying in the stretcher in no acute distress. HEENT:  Pupils round, reactive to light.  Extraocular movements intact. Oral mucosa is dry. NECK:  No JVD or lymphadenopathy. CARDIOVASCULAR SYSTEM:  S1, S2.  Regular rate and rhythm. LUNGS:  Clear to auscultation bilaterally. ABDOMEN:  Soft, nondistended with no rigidity or rebound.  No organomegaly.  Mild right flank tenderness. EXTREMITIES:  No edema, clubbing, or cyanosis.  Review of laboratory data shows white count of 13.3, hemoglobin 13.9, platelets at 235,000.  Chemistries:  Sodium 136, potassium 3.1, chloride 99, bicarb 27, BUN 5, creatinine 0.7, glucose 108, AST is 50, ALT is 116,  lipase is 43.  Urine pregnancy is negative.  Urine is cloudy with moderate leukocytes, only 3-6 WBC and few bacteria.  ASSESSMENT/PLAN: 1. This is a 35 year old female with nausea, vomiting, abdominal pain.     She potentially could have gastritis.  Her abdominal exam is     completely unremarkable and her CT and ultrasound of her belly are     normal as well.  It is unlikely that she would be withdrawing from     methadone 2 weeks after stopping it.  We will also check her tox     screen and alcohol level since I am not sure she is being     forthcoming about her abuse and her alcohol problems in the past.     We will admit her for a 23-hour observation, start her on clear     liquids to advance as tolerated, IV proton pump inhibitor, IV     Zofran, and Phenergan on p.r.n. basis. 2. Leukocytosis.  Suspect this is reactive.  It is possible that she     has urinary tract infection with mild flank tenderness and possible     pyelo but we will check urine cultures.  We will give her 1 gram of     Rocephin. 3. History of heroin abuse, off methadone for 2 weeks. 4. I suspect lot of her symptoms have a psychosocial component to it.    All of this could potentially be symptoms of conversion disorder     too if nothing else organic is detected.  Further management as     condition evolves.     Zannie Cove, MD     PJ/MEDQ  D:  02/25/2011  T:  02/25/2011  Job:  [id number]  Electronically Signed by Zannie Cove  on 03/20/2011 03:58:48 PM

## 2011-03-27 NOTE — Discharge Summary (Signed)
  NAMEMEGIN, CONSALVO                 ACCOUNT NO.:  [id number]  MEDICAL RECORD NO.:  [id number]  LOCATION:                                 FACILITY:  PHYSICIAN:  Calvert Cantor, M.D.     DATE OF BIRTH:  1976/05/19  DATE OF ADMISSION:  02/24/2011 DATE OF DISCHARGE:                              DISCHARGE SUMMARY   PRIMARY CARE PHYSICIAN:  None.  The patient states that she will be finding a PCP soon.  CHIEF COMPLAINT:  Nausea and vomiting.  DISCHARGE DIAGNOSIS:  Nausea, vomiting, and epigastric pain, possibly gastritis.  PAST MEDICAL HISTORY: 1. Polysubstance abuse. 2. Depression.  DISCHARGE MEDICATIONS: 1. Prilosec OTC 20 mg daily as needed. 2. Mupirocin 2% ointment one application nasally twice a day. 3. Phenergan 25 mg two tablets every 4 hours as needed.  PROCEDURES:  Abdominal ultrasound complete performed February 25, 2011, impression was surgical absence of the gallbladder, no bile duct dilatation.  HOSPITAL COURSE:  This is a 35 year old female with a prior history of heroin abuse who was also on methadone up until 2 weeks ago.  The patient presented to the ER with a complaint of nausea, vomiting, and epigastric pain which radiated throughout her abdomen.  The patient did not complain of any hematemesis.  She presented to the ER at [facility] and was given Prilosec and discharged from the ER, but her symptoms persisted and therefore she presented to [facility] ER.  In the ER, she had above-mentioned ultrasound which was unremarkable.  The patient was admitted and started on IV proton pump inhibitor and IV Zofran and Phenergan p.r.n.  She improved quickly and symptoms resolved by the next day.  She is advised to continue taking Prilosec for now.  CONDITION ON DISCHARGE:  Stable.  PHYSICAL EXAM:  ABDOMEN:  Soft, slightly tender in the epigastrium. Bowel sounds positive.  Nondistended. LUNGS:  Clear bilaterally. HEART:  Regular rate and rhythm.  No murmurs.  FOLLOWUP  INSTRUCTIONS:  She is advised to find a PCP to follow up with.  Time on patient care today was 40 minutes     Calvert Cantor, M.D.     SR/MEDQ  D:  03/22/2011  T:  03/22/2011  Job:  [id number]  Electronically Signed by Calvert Cantor M.D. on 03/27/2011 02:04:07 PM

## 2011-07-09 ENCOUNTER — Encounter: Payer: Self-pay | Admitting: *Deleted

## 2011-07-09 ENCOUNTER — Emergency Department (HOSPITAL_COMMUNITY)
Admission: EM | Admit: 2011-07-09 | Discharge: 2011-07-09 | Disposition: A | Discharge status: Home / Self Care | Payer: Self-pay | Attending: Emergency Medicine | Admitting: Emergency Medicine

## 2011-07-09 ENCOUNTER — Emergency Department (HOSPITAL_COMMUNITY): Payer: Self-pay

## 2011-07-09 DIAGNOSIS — M2550 Pain in unspecified joint: Secondary | ICD-10-CM | POA: Insufficient documentation

## 2011-07-09 DIAGNOSIS — M545 Low back pain, unspecified: Secondary | ICD-10-CM | POA: Insufficient documentation

## 2011-07-09 DIAGNOSIS — W108XXA Fall (on) (from) other stairs and steps, initial encounter: Secondary | ICD-10-CM | POA: Insufficient documentation

## 2011-07-09 DIAGNOSIS — F341 Dysthymic disorder: Secondary | ICD-10-CM | POA: Insufficient documentation

## 2011-07-09 DIAGNOSIS — N898 Other specified noninflammatory disorders of vagina: Secondary | ICD-10-CM | POA: Insufficient documentation

## 2011-07-09 DIAGNOSIS — IMO0001 Reserved for inherently not codable concepts without codable children: Secondary | ICD-10-CM | POA: Insufficient documentation

## 2011-07-09 DIAGNOSIS — F17200 Nicotine dependence, unspecified, uncomplicated: Secondary | ICD-10-CM | POA: Insufficient documentation

## 2011-07-09 DIAGNOSIS — Z79899 Other long term (current) drug therapy: Secondary | ICD-10-CM | POA: Insufficient documentation

## 2011-07-09 HISTORY — DX: Depression, unspecified: F32.A

## 2011-07-09 HISTORY — DX: Anxiety disorder, unspecified: F41.9

## 2011-07-09 HISTORY — DX: Major depressive disorder, single episode, unspecified: F32.9

## 2011-07-09 MED ORDER — METHOCARBAMOL 500 MG PO TABS: 500.0000 mg | ORAL_TABLET | Freq: Two times a day (BID) | ORAL | Status: AC

## 2011-07-09 MED ORDER — OXYCODONE-ACETAMINOPHEN 5-325 MG PO TABS
2.0000 | ORAL_TABLET | Freq: Once | ORAL | Status: AC
  Administered 2011-07-09: 2 via ORAL
  Filled 2011-07-09: qty 2

## 2011-07-09 MED ORDER — HYDROCODONE-ACETAMINOPHEN 5-325 MG PO TABS: 2.0000 | ORAL_TABLET | ORAL | Status: AC | PRN

## 2011-07-09 NOTE — ED Notes (Signed)
md in with patient

## 2011-07-09 NOTE — ED Notes (Signed)
Patient reports she fell down 7 steps 1 week ago.  She has tried otc meds and treatment w/o relief.  Patient complains of lower back pain.

## 2011-07-09 NOTE — ED Provider Notes (Signed)
History     CSN: [id number] Arrival date & time: 07/09/2011  8:52 AM   First MD Initiated Contact with Patient 07/09/11 [phone]      Chief Complaint  Patient presents with  . Fall  . Back Pain    (Consider location/radiation/quality/duration/timing/severity/associated sxs/prior treatment) HPI Comments: Patient presents with one week of low back pain that occurred after she fell down steps week ago. She landed on her low back and buttocks and said she bounced and 7 when the steps. She's had persistent pain for the past week unrelieved by ibuprofen. The pain is worse with bending and twisting. She denies any weakness, numbness or tingling, fever or vomiting. She's had no bowel or bladder incontinence.  She is currently on her menstrual period but denies any other vaginal urinary symptoms. She denies abdominal pain, chest pain, upper back pain head or neck pain.  Patient is a 35 y.o. female presenting with fall. The history is provided by the patient.  Fall The accident occurred more than 1 week ago. The fall occurred while standing. She fell from a height of 3 to 5 ft. She landed on a hard floor. Pertinent negatives include no abdominal pain, no vomiting, no hematuria and no headaches.    Past Medical History  Diagnosis Date  . Anxiety   . Depression     Past Surgical History  Procedure Date  . Cholecystectomy   . Tubal ligation     No family history on file.  History  Substance Use Topics  . Smoking status: Current Everyday Smoker  . Smokeless tobacco: Not on file  . Alcohol Use: Yes    OB History    Grav Para Term Preterm Abortions TAB SAB Ect Mult Living                  Review of Systems  Constitutional: Negative for activity change and appetite change.  HENT: Negative for congestion and rhinorrhea.   Respiratory: Negative for shortness of breath.   Cardiovascular: Negative for chest pain.  Gastrointestinal: Negative for vomiting and abdominal pain.    Genitourinary: Positive for vaginal bleeding. Negative for dysuria, hematuria and vaginal discharge.  Musculoskeletal: Positive for myalgias, back pain and arthralgias.  Skin: Negative for rash.  Neurological: Negative for headaches.    Allergies  Review of patient's allergies indicates no known allergies.  Home Medications   Current Outpatient Rx  Name Route Sig Dispense Refill  . ALPRAZOLAM 1 MG PO TABS Oral Take 1 mg by mouth 4 (four) times daily. scheduled     . OLANZAPINE-FLUOXETINE HCL 6-25 MG PO CAPS Oral Take 1 capsule by mouth every evening.      Marland Kitchen HYDROCODONE-ACETAMINOPHEN 5-325 MG PO TABS Oral Take 2 tablets by mouth every 4 (four) hours as needed for pain. 10 tablet 0  . METHOCARBAMOL 500 MG PO TABS Oral Take 1 tablet (500 mg total) by mouth 2 (two) times daily. 20 tablet 0    BP 121/81  Pulse 78  Temp(Src) 98.2 F (36.8 C) (Oral)  Resp 20  SpO2 95%  LMP 07/09/2011  Physical Exam  Constitutional: She is oriented to person, place, and time. She appears well-developed and well-nourished. No distress.  HENT:  Head: Normocephalic and atraumatic.  Mouth/Throat: Oropharynx is clear and moist. No oropharyngeal exudate.  Eyes: Conjunctivae are normal. Pupils are equal, round, and reactive to light.  Neck: Normal range of motion.  Cardiovascular: Normal rate, regular rhythm and normal heart sounds.   Pulmonary/Chest: Effort  normal and breath sounds normal. No respiratory distress.  Abdominal: Soft. There is no tenderness. There is no rebound and no guarding.  Musculoskeletal: Normal range of motion. She exhibits tenderness. She exhibits no edema.       Diffuse low back pain with paraspinal and midline tenderness no step-off or deformity. Negative straight leg raise bilaterally  Neurological: She is alert and oriented to person, place, and time. No cranial nerve deficit.       5 Out of 5 strength in bilateral lower extremities, +2 patellar reflexes, +2 DP and PT pulses   Skin: Skin is warm.    ED Course  Procedures (including critical care time)  Labs Reviewed - No data to display Dg Lumbar Spine Complete  07/09/2011  *RADIOLOGY REPORT*  Clinical Data: Low back pain, fell down stairs  LUMBAR SPINE - COMPLETE 4+ VIEW  Comparison: None  Findings: Five non-rib bearing lumbar vertebrae. Osseous mineralization normal. Vertebral body and disc space heights maintained. No acute fracture, subluxation or bone destruction. No spondylolysis. SI joints symmetric.  IMPRESSION: No acute bony abnormalities.  Original Report Authenticated By: Lollie Marrow, M.D.     1. Low back pain       MDM  Low back pain after mechanical fall with no neurological deficits. Will check Xray, pain control, follow up PCP.        Glynn Octave, MD 07/09/11 2012

## 2011-07-09 NOTE — ED Notes (Signed)
Pt needs rx sent to [facility] on [address].  Not transportation to [facility]

## 2011-07-26 ENCOUNTER — Encounter (HOSPITAL_COMMUNITY): Payer: Self-pay | Admitting: *Deleted

## 2011-07-26 ENCOUNTER — Emergency Department (HOSPITAL_COMMUNITY)
Admission: EM | Admit: 2011-07-26 | Discharge: 2011-07-26 | Discharge status: Against Medical Advice | Payer: Self-pay | Attending: Emergency Medicine | Admitting: Emergency Medicine

## 2011-07-26 DIAGNOSIS — J3489 Other specified disorders of nose and nasal sinuses: Secondary | ICD-10-CM | POA: Insufficient documentation

## 2011-07-26 DIAGNOSIS — Z79899 Other long term (current) drug therapy: Secondary | ICD-10-CM | POA: Insufficient documentation

## 2011-07-26 DIAGNOSIS — R059 Cough, unspecified: Secondary | ICD-10-CM | POA: Insufficient documentation

## 2011-07-26 DIAGNOSIS — R6883 Chills (without fever): Secondary | ICD-10-CM | POA: Insufficient documentation

## 2011-07-26 DIAGNOSIS — F341 Dysthymic disorder: Secondary | ICD-10-CM | POA: Insufficient documentation

## 2011-07-26 DIAGNOSIS — J069 Acute upper respiratory infection, unspecified: Secondary | ICD-10-CM | POA: Insufficient documentation

## 2011-07-26 DIAGNOSIS — R05 Cough: Secondary | ICD-10-CM | POA: Insufficient documentation

## 2011-07-26 NOTE — ED Notes (Signed)
Radiology attempted to take pt for xray, pt is not in rm and gown is on bed.

## 2011-07-26 NOTE — ED Provider Notes (Signed)
History     CSN: [id number] Arrival date & time: 07/26/2011  8:02 AM   First MD Initiated Contact with Patient 07/26/11 0910     10:10 AM HPI Patient is a 35 y.o. female presenting with flu symptoms.  Influenza The problem has been gradually worsening. Associated symptoms include chills, congestion and coughing. Pertinent negatives include no abdominal pain, chest pain, fever, headaches, myalgias, nausea, neck pain, numbness, rash, sore throat or vomiting. She has tried acetaminophen for the symptoms. The treatment provided no relief.    Past Medical History  Diagnosis Date  . Anxiety   . Depression     Past Surgical History  Procedure Date  . Cholecystectomy   . Tubal ligation     History reviewed. No pertinent family history.  History  Substance Use Topics  . Smoking status: Current Everyday Smoker  . Smokeless tobacco: Not on file  . Alcohol Use: Yes    OB History    Grav Para Term Preterm Abortions TAB SAB Ect Mult Living                  Review of Systems  Constitutional: Positive for chills. Negative for fever.  HENT: Positive for congestion. Negative for sore throat and neck pain.   Respiratory: Positive for cough.   Cardiovascular: Negative for chest pain.  Gastrointestinal: Negative for nausea, vomiting and abdominal pain.  Musculoskeletal: Negative for myalgias.  Skin: Negative for rash.  Neurological: Negative for numbness and headaches.  All other systems reviewed and are negative.    Allergies  Review of patient's allergies indicates no known allergies.  Home Medications   Current Outpatient Rx  Name Route Sig Dispense Refill  . ALPRAZOLAM 1 MG PO TABS Oral Take 1 mg by mouth 4 (four) times daily. scheduled     . OLANZAPINE-FLUOXETINE HCL 6-25 MG PO CAPS Oral Take 1 capsule by mouth every evening.        BP 124/80  Pulse 92  Temp(Src) 98.7 F (37.1 C) (Oral)  Resp 16  Ht 5\' 9"  (1.753 m)  Wt 160 lb (72.576 kg)  BMI 23.63 kg/m2  SpO2  99%  LMP 07/09/2011  Physical Exam  Vitals reviewed. Constitutional: She is oriented to person, place, and time. Vital signs are normal. She appears well-developed and well-nourished.  HENT:  Head: Normocephalic and atraumatic.  Right Ear: External ear normal.  Left Ear: External ear normal.  Nose: Nose normal.  Mouth/Throat: Oropharynx is clear and moist. No oropharyngeal exudate.  Eyes: Conjunctivae are normal. Pupils are equal, round, and reactive to light.  Neck: Normal range of motion. Neck supple. No spinous process tenderness and no muscular tenderness present. No rigidity.  Cardiovascular: Normal rate, regular rhythm and normal heart sounds.  Exam reveals no friction rub.   No murmur heard. Pulmonary/Chest: Effort normal and breath sounds normal. She has no wheezes. She has no rhonchi. She has no rales. She exhibits no tenderness.  Abdominal: Soft. Bowel sounds are normal. She exhibits no distension. There is no tenderness.  Musculoskeletal: Normal range of motion.  Neurological: She is alert and oriented to person, place, and time. Coordination normal.  Skin: Skin is warm and dry. No rash noted. No erythema. No pallor.    ED Course  Procedures  MDM   Pt was pending Xray and has left without informing staff s/p provider exam.       Thomasene Lot, PA 07/27/11 [phone]

## 2011-07-26 NOTE — ED Notes (Signed)
Congestion, runny nose, and ears seemed clogged up, weak and light headedness, chills, and cold sweats. Coughing up real light mucous.

## 2011-07-27 NOTE — ED Provider Notes (Signed)
Medical screening examination/treatment/procedure(s) were performed by non-physician practitioner and as supervising physician I was immediately available for consultation/collaboration.  Ethelda Chick, MD 07/27/11 1020

## 2011-07-29 ENCOUNTER — Encounter (HOSPITAL_COMMUNITY): Payer: Self-pay | Admitting: *Deleted

## 2011-07-29 ENCOUNTER — Emergency Department (HOSPITAL_COMMUNITY)
Admission: EM | Admit: 2011-07-29 | Discharge: 2011-07-29 | Discharge status: Against Medical Advice | Payer: Self-pay | Attending: Emergency Medicine | Admitting: Emergency Medicine

## 2011-07-29 DIAGNOSIS — Z0389 Encounter for observation for other suspected diseases and conditions ruled out: Secondary | ICD-10-CM | POA: Insufficient documentation

## 2011-07-29 DIAGNOSIS — B9789 Other viral agents as the cause of diseases classified elsewhere: Secondary | ICD-10-CM | POA: Insufficient documentation

## 2011-07-29 DIAGNOSIS — F17200 Nicotine dependence, unspecified, uncomplicated: Secondary | ICD-10-CM | POA: Insufficient documentation

## 2011-07-29 DIAGNOSIS — R059 Cough, unspecified: Secondary | ICD-10-CM | POA: Insufficient documentation

## 2011-07-29 DIAGNOSIS — R05 Cough: Secondary | ICD-10-CM | POA: Insufficient documentation

## 2011-07-29 DIAGNOSIS — R112 Nausea with vomiting, unspecified: Secondary | ICD-10-CM | POA: Insufficient documentation

## 2011-07-29 NOTE — ED Notes (Signed)
Called Pt. 3x no answer.

## 2011-07-29 NOTE — ED Notes (Signed)
To ed for eval of flu symptoms for the past month. Unable to see pmd. Last fever was a week ago. Masked at triage

## 2011-07-30 ENCOUNTER — Emergency Department (HOSPITAL_COMMUNITY)
Admission: EM | Admit: 2011-07-30 | Discharge: 2011-07-30 | Disposition: A | Discharge status: Home / Self Care | Payer: Self-pay | Attending: Emergency Medicine | Admitting: Emergency Medicine

## 2011-07-30 ENCOUNTER — Encounter (HOSPITAL_COMMUNITY): Payer: Self-pay | Admitting: Emergency Medicine

## 2011-07-30 ENCOUNTER — Emergency Department (HOSPITAL_COMMUNITY): Payer: Self-pay

## 2011-07-30 DIAGNOSIS — B349 Viral infection, unspecified: Secondary | ICD-10-CM

## 2011-07-30 LAB — POCT I-STAT, CHEM 8
BUN: 8 mg/dL (ref 6–23)
Calcium, Ion: 1.16 mmol/L (ref 1.12–1.32)
Chloride: 100 mEq/L (ref 96–112)
HCT: 39 % (ref 36.0–46.0)
Sodium: 138 mEq/L (ref 135–145)
TCO2: 28 mmol/L (ref 0–100)

## 2011-07-30 LAB — URINALYSIS, ROUTINE W REFLEX MICROSCOPIC
Bilirubin Urine: NEGATIVE
Glucose, UA: NEGATIVE mg/dL
Hgb urine dipstick: NEGATIVE
Ketones, ur: NEGATIVE mg/dL
Leukocytes, UA: NEGATIVE
Nitrite: NEGATIVE
Protein, ur: NEGATIVE mg/dL
Specific Gravity, Urine: 1.006 (ref 1.005–1.030)
Urobilinogen, UA: 0.2 mg/dL (ref 0.0–1.0)
pH: 6 (ref 5.0–8.0)

## 2011-07-30 MED ORDER — MORPHINE SULFATE 4 MG/ML IJ SOLN: 4.0000 mg | Freq: Once | INTRAMUSCULAR | Status: DC

## 2011-07-30 MED ORDER — MORPHINE SULFATE 2 MG/ML IJ SOLN
INTRAMUSCULAR | Status: AC
  Administered 2011-07-30: 4 mg via INTRAVENOUS
  Filled 2011-07-30: qty 2

## 2011-07-30 MED ORDER — SODIUM CHLORIDE 0.9 % IV BOLUS (SEPSIS)
1000.0000 mL | Freq: Once | INTRAVENOUS | Status: AC
  Administered 2011-07-30: 1000 mL via INTRAVENOUS

## 2011-07-30 MED ORDER — ONDANSETRON HCL 4 MG/2ML IJ SOLN
4.0000 mg | Freq: Once | INTRAMUSCULAR | Status: AC
  Administered 2011-07-30: 4 mg via INTRAVENOUS
  Filled 2011-07-30: qty 2

## 2011-07-30 MED ORDER — HYDROCOD POLST-CHLORPHEN POLST 10-8 MG/5ML PO LQCR: 5.0000 mL | Freq: Two times a day (BID) | ORAL | Status: DC | PRN

## 2011-07-30 MED ORDER — PROMETHAZINE HCL 25 MG PO TABS: 25.0000 mg | ORAL_TABLET | Freq: Four times a day (QID) | ORAL | Status: DC | PRN

## 2011-07-30 NOTE — ED Provider Notes (Signed)
History     CSN: [id number] Arrival date & time: 07/30/2011  1:11 AM   First MD Initiated Contact with Patient 07/30/11 [phone]      Chief Complaint  Patient presents with  . Influenza     Patient is a 35 y.o. female presenting with flu symptoms.  Influenza This is a new problem. The current episode started 1 to 4 weeks ago. The problem occurs constantly. The problem has been gradually worsening. Associated symptoms include chills, congestion, coughing, a fever, nausea, a sore throat and vomiting.  Reports an approximate one month history of upper respiratory congestion,  productive cough and intermittent fever. Also reports approximately 1 week history of persistent nausea vomiting and diarrhea. States approximately 25 episodes of vomiting and 3 episodes of diarrhea in the last 24 hours. Also complaining of sore throat body aches and generalized abdominal pain from frequent vomiting. Symptoms have not responded to over-the-counter cold remedies.  Past Medical History  Diagnosis Date  . Anxiety   . Depression     Past Surgical History  Procedure Date  . Cholecystectomy   . Tubal ligation     Family History  Problem Relation Age of Onset  . Pneumonia Other     History  Substance Use Topics  . Smoking status: Current Everyday Smoker -- 1.0 packs/day  . Smokeless tobacco: Not on file  . Alcohol Use: Yes     rare    OB History    Grav Para Term Preterm Abortions TAB SAB Ect Mult Living                  Review of Systems  Constitutional: Positive for fever and chills.  HENT: Positive for congestion and sore throat.   Eyes: Negative.   Respiratory: Positive for cough.   Cardiovascular: Negative.   Gastrointestinal: Positive for nausea and vomiting.  Genitourinary: Negative.   Musculoskeletal: Negative.   Skin: Negative.   Neurological: Negative.   Hematological: Negative.   Psychiatric/Behavioral: Negative.     Allergies  Review of patient's allergies  indicates no known allergies.  Home Medications   Current Outpatient Rx  Name Route Sig Dispense Refill  . ALPRAZOLAM 1 MG PO TABS Oral Take 1 mg by mouth 4 (four) times daily. scheduled     . OLANZAPINE-FLUOXETINE HCL 6-25 MG PO CAPS Oral Take 1 capsule by mouth every evening.        BP 112/86  Pulse 95  Temp 98.1 F (36.7 C)  Resp 20  SpO2 100%  LMP 07/03/2011  Physical Exam  Constitutional: She is oriented to person, place, and time. She appears well-developed and well-nourished.  HENT:  Head: Normocephalic and atraumatic.  Eyes: Conjunctivae are normal.  Neck: Neck supple.  Cardiovascular: Normal rate and regular rhythm.   Pulmonary/Chest: Effort normal and breath sounds normal.  Abdominal: Soft. Bowel sounds are normal. She exhibits no distension. There is no guarding.  Musculoskeletal: Normal range of motion.  Neurological: She is alert and oriented to person, place, and time.  Skin: Skin is warm and dry. No erythema.  Psychiatric: She has a normal mood and affect.    ED Course  Procedures patient with multiple complaints. Productive cough and intermittent fevers times one month and GI symptoms x3 weeks. Apparently presented at [facility] emergency department yesterday but left without being seen. Patient requesting pain medicine. We'll initiate IV hydration, obtain appropriate labs and chest x-ray and reassess.  Patient states feeling much better after IV fluids and  medications. Discussed findings and clinical impression with patient. Will plan for discharge home and encourage followup with her primary care physician if symptoms persist. Patient is agreeable with plan.    Labs Reviewed  POCT I-STAT, CHEM 8  I-STAT, CHEM 8  URINALYSIS, ROUTINE W REFLEX MICROSCOPIC  POCT PREGNANCY, URINE   Dg Chest 2 View  07/30/2011  *RADIOLOGY REPORT*  Clinical Data: Fever for 1 week.  Cough for 1 month.  Vomiting today.  CHEST - 2 VIEW  Comparison: 05/03/2010   Findings: The heart size and pulmonary vascularity are normal. The lungs appear clear and expanded without focal air space disease or consolidation. No blunting of the costophrenic angles. Peribronchial thickening suggesting chronic bronchitis. Degenerative changes in the thoracic spine.  No significant change since prior study.  IMPRESSION: No evidence of active pulmonary disease.  Original Report Authenticated By: Marlon Pel, M.D.     No diagnosis found.    MDM  HPI, PE and findings most c/w viral syndrome.        Leanne Chang, NP 07/30/11 [phone]

## 2011-07-30 NOTE — ED Provider Notes (Signed)
Medical screening examination/treatment/procedure(s) were performed by non-physician practitioner and as supervising physician I was immediately available for consultation/collaboration.  Marilyn Wing, MD 07/30/11 1558 

## 2011-07-30 NOTE — ED Notes (Signed)
Patient states that she is unable to give urine sample at this time. 

## 2011-07-30 NOTE — ED Notes (Signed)
Pt states she has been sick for about a month  Pt states she has chest pain, sore throat, shortness of breath, productive cough with green sputum, ears hurt, and has been vomiting for the past 3 days  Pt has hx of MRSA

## 2011-08-03 ENCOUNTER — Emergency Department (HOSPITAL_COMMUNITY)
Admission: EM | Admit: 2011-08-03 | Discharge: 2011-08-04 | Disposition: A | Discharge status: Home / Self Care | Payer: Self-pay | Attending: Emergency Medicine | Admitting: Emergency Medicine

## 2011-08-03 ENCOUNTER — Encounter (HOSPITAL_COMMUNITY): Payer: Self-pay

## 2011-08-03 DIAGNOSIS — R059 Cough, unspecified: Secondary | ICD-10-CM | POA: Insufficient documentation

## 2011-08-03 DIAGNOSIS — F329 Major depressive disorder, single episode, unspecified: Secondary | ICD-10-CM | POA: Insufficient documentation

## 2011-08-03 DIAGNOSIS — J40 Bronchitis, not specified as acute or chronic: Secondary | ICD-10-CM | POA: Insufficient documentation

## 2011-08-03 DIAGNOSIS — R509 Fever, unspecified: Secondary | ICD-10-CM | POA: Insufficient documentation

## 2011-08-03 DIAGNOSIS — R0789 Other chest pain: Secondary | ICD-10-CM | POA: Insufficient documentation

## 2011-08-03 DIAGNOSIS — F3289 Other specified depressive episodes: Secondary | ICD-10-CM | POA: Insufficient documentation

## 2011-08-03 DIAGNOSIS — F411 Generalized anxiety disorder: Secondary | ICD-10-CM | POA: Insufficient documentation

## 2011-08-03 DIAGNOSIS — IMO0001 Reserved for inherently not codable concepts without codable children: Secondary | ICD-10-CM | POA: Insufficient documentation

## 2011-08-03 DIAGNOSIS — R05 Cough: Secondary | ICD-10-CM | POA: Insufficient documentation

## 2011-08-03 MED ORDER — KETOROLAC TROMETHAMINE 30 MG/ML IJ SOLN
30.0000 mg | Freq: Once | INTRAMUSCULAR | Status: AC
  Administered 2011-08-03: 30 mg via INTRAVENOUS
  Filled 2011-08-03: qty 1

## 2011-08-03 MED ORDER — SODIUM CHLORIDE 0.9 % IV BOLUS (SEPSIS)
1000.0000 mL | Freq: Once | INTRAVENOUS | Status: AC
  Administered 2011-08-03: 1000 mL via INTRAVENOUS

## 2011-08-03 MED ORDER — ACETAMINOPHEN-CODEINE #3 300-30 MG PO TABS
1.0000 | ORAL_TABLET | Freq: Once | ORAL | Status: AC
  Administered 2011-08-03: 1 via ORAL
  Filled 2011-08-03: qty 1

## 2011-08-03 MED ORDER — AZITHROMYCIN 250 MG PO TABS: 250.0000 mg | ORAL_TABLET | Freq: Every day | ORAL | Status: DC

## 2011-08-03 MED ORDER — ALBUTEROL SULFATE HFA 108 (90 BASE) MCG/ACT IN AERS
2.0000 | INHALATION_SPRAY | Freq: Once | RESPIRATORY_TRACT | Status: AC
  Administered 2011-08-03: 2 via RESPIRATORY_TRACT
  Filled 2011-08-03 (×2): qty 6.7

## 2011-08-03 MED ORDER — IPRATROPIUM BROMIDE 0.02 % IN SOLN: 0.5000 mg | RESPIRATORY_TRACT | Status: DC

## 2011-08-03 MED ORDER — BENZONATATE 100 MG PO CAPS: 100.0000 mg | ORAL_CAPSULE | Freq: Three times a day (TID) | ORAL | Status: DC | PRN

## 2011-08-03 MED ORDER — ALBUTEROL SULFATE (5 MG/ML) 0.5% IN NEBU: 2.5000 mg | INHALATION_SOLUTION | RESPIRATORY_TRACT | Status: DC

## 2011-08-03 MED ORDER — ACETAMINOPHEN-CODEINE #3 300-30 MG PO TABS: 1.0000 | ORAL_TABLET | Freq: Four times a day (QID) | ORAL | Status: AC | PRN

## 2011-08-03 NOTE — ED Notes (Signed)
Per [facility], pt c/o non cardiac chest pain d/t ongoing complications w/Bronchitis x2 wks, pt dx approx 10 days ago at [facility] ED w/Bronchitis and prescribed Tussinex w/no relief. Pt also reports productive cough w/yellow colored sputum and increase pain w/coughing.

## 2011-08-03 NOTE — ED Provider Notes (Signed)
History     CSN: [id number] Arrival date & time: 08/03/2011  6:54 PM   First MD Initiated Contact with Patient 08/03/11 2037      Chief Complaint  Patient presents with  . Bronchitis     HPI  35yoF ho anxiety, depression pw cough. Pt c/o coughing spells x 3-4 weeks. Worse now. Fevers to 102 at home, last yesterday. Has been taking cough medication without relief. Recent negative CXR at [facility]. C/O body aches. No URI sx, no sore throat or shortness of breath. Denies sick contacts. Feels dehydrated. Denies hematuria/dysuria/freq/urgency. No abdominal pain, nausea, vomiting.  ED Notes, ED Provider Notes from 08/03/11 0000 to 08/03/11 19:02:22       Gerarda Fraction, RN 08/03/2011 19:01      Per [facility], pt c/o non cardiac chest pain d/t ongoing complications w/Bronchitis x2 wks, pt dx approx 10 days ago at [facility] ED w/Bronchitis and prescribed Tussinex w/no relief. Pt also reports productive cough w/yellow colored sputum and increase pain w/coughing.    Past Medical History  Diagnosis Date  . Anxiety   . Depression     Past Surgical History  Procedure Date  . Cholecystectomy   . Tubal ligation     Family History  Problem Relation Age of Onset  . Pneumonia Other     History  Substance Use Topics  . Smoking status: Current Everyday Smoker -- 1.0 packs/day  . Smokeless tobacco: Not on file  . Alcohol Use: Yes     rare    OB History    Grav Para Term Preterm Abortions TAB SAB Ect Mult Living                 Review of Systems  All other systems reviewed and are negative.  except as noted HPI   Allergies  Review of patient's allergies indicates no known allergies.  Home Medications   Current Outpatient Rx  Name Route Sig Dispense Refill  . ALPRAZOLAM 1 MG PO TABS Oral Take 1 mg by mouth 4 (four) times daily. scheduled     . OLANZAPINE-FLUOXETINE HCL 6-25 MG PO CAPS Oral Take 1 capsule by mouth every evening.      . ACETAMINOPHEN-CODEINE #3 300-30 MG PO TABS Oral  Take 1-2 tablets by mouth every 6 (six) hours as needed for pain. 15 tablet 0  . AZITHROMYCIN 250 MG PO TABS Oral Take 1 tablet (250 mg total) by mouth daily. Take first 2 tablets together, then 1 every day until finished. 6 tablet 0    BP 121/86  Pulse 102  Temp(Src) 97.2 F (36.2 C) (Oral)  Resp 20  SpO2 98%  LMP 07/03/2011  Physical Exam  Nursing note and vitals reviewed. Constitutional: She is oriented to person, place, and time. She appears well-developed.  HENT:  Head: Atraumatic.  Mouth/Throat: Oropharynx is clear and moist.  Eyes: Conjunctivae and EOM are normal. Pupils are equal, round, and reactive to light.  Neck: Normal range of motion. Neck supple.  Cardiovascular: Normal rate, regular rhythm, normal heart sounds and intact distal pulses.   Pulmonary/Chest: Effort normal. No respiratory distress. She has wheezes. She has no rales.       Mild exp wheeze  Abdominal: Soft. She exhibits no distension. There is no tenderness. There is no rebound and no guarding.  Musculoskeletal: Normal range of motion.  Neurological: She is alert and oriented to person, place, and time.  Skin: Skin is warm and dry. No rash noted.  Psychiatric: She  has a normal mood and affect.    ED Course  Procedures (including critical care time)  Labs Reviewed - No data to display No results found.   1. Bronchitis    MDM  Bronchitis. Got IVF, toradol, duoneb here. Will not repeat CXR. Zpack, albuterol inhaler for home, Tylenol #3. PMD f/u        Forbes Cellar, MD 08/03/11 2307

## 2011-08-04 ENCOUNTER — Emergency Department (HOSPITAL_COMMUNITY)
Admission: EM | Admit: 2011-08-04 | Discharge: 2011-08-04 | Disposition: A | Discharge status: Home / Self Care | Payer: Self-pay | Attending: Emergency Medicine | Admitting: Emergency Medicine

## 2011-08-04 ENCOUNTER — Encounter (HOSPITAL_COMMUNITY): Payer: Self-pay | Admitting: *Deleted

## 2011-08-04 DIAGNOSIS — J3489 Other specified disorders of nose and nasal sinuses: Secondary | ICD-10-CM | POA: Insufficient documentation

## 2011-08-04 DIAGNOSIS — R509 Fever, unspecified: Secondary | ICD-10-CM | POA: Insufficient documentation

## 2011-08-04 DIAGNOSIS — F341 Dysthymic disorder: Secondary | ICD-10-CM | POA: Insufficient documentation

## 2011-08-04 DIAGNOSIS — J40 Bronchitis, not specified as acute or chronic: Secondary | ICD-10-CM | POA: Insufficient documentation

## 2011-08-04 DIAGNOSIS — R05 Cough: Secondary | ICD-10-CM | POA: Insufficient documentation

## 2011-08-04 DIAGNOSIS — R059 Cough, unspecified: Secondary | ICD-10-CM | POA: Insufficient documentation

## 2011-08-04 DIAGNOSIS — Z9889 Other specified postprocedural states: Secondary | ICD-10-CM | POA: Insufficient documentation

## 2011-08-04 DIAGNOSIS — F17200 Nicotine dependence, unspecified, uncomplicated: Secondary | ICD-10-CM | POA: Insufficient documentation

## 2011-08-04 DIAGNOSIS — Z5900 Homelessness unspecified: Secondary | ICD-10-CM | POA: Insufficient documentation

## 2011-08-04 DIAGNOSIS — IMO0001 Reserved for inherently not codable concepts without codable children: Secondary | ICD-10-CM | POA: Insufficient documentation

## 2011-08-04 DIAGNOSIS — Z79899 Other long term (current) drug therapy: Secondary | ICD-10-CM | POA: Insufficient documentation

## 2011-08-04 DIAGNOSIS — R5381 Other malaise: Secondary | ICD-10-CM | POA: Insufficient documentation

## 2011-08-04 MED ORDER — DOXYCYCLINE HYCLATE 100 MG PO CAPS: 100.0000 mg | ORAL_CAPSULE | Freq: Two times a day (BID) | ORAL | Status: AC

## 2011-08-04 MED ORDER — HYDROCODONE-ACETAMINOPHEN 5-325 MG PO TABS
2.0000 | ORAL_TABLET | Freq: Once | ORAL | Status: AC
  Administered 2011-08-04: 2 via ORAL
  Filled 2011-08-04: qty 2

## 2011-08-04 MED ORDER — DEXAMETHASONE SODIUM PHOSPHATE 10 MG/ML IJ SOLN
10.0000 mg | Freq: Once | INTRAMUSCULAR | Status: AC
  Administered 2011-08-04: 10 mg via INTRAMUSCULAR
  Filled 2011-08-04: qty 1

## 2011-08-04 NOTE — ED Notes (Signed)
Pt is taking the bus home.  

## 2011-08-04 NOTE — ED Provider Notes (Signed)
History     CSN: [id number] Arrival date & time: 08/04/2011 12:01 PM   First MD Initiated Contact with Patient 08/04/11 1259      Chief Complaint  Patient presents with  . Influenza    (Consider location/radiation/quality/duration/timing/severity/associated sxs/prior treatment) HPI Comments: Pt has had cough for about 5 weeks.  Has been seen at [facility] several times in recent past with persistent cough.  Had CXR 5 days ago which was negative.  Seen yesterday, prescribed z-pack which she says is too expensive.  Also given albuterol MDI which she is using.  Still c/o cough, subjective fever (is currently homeless), myalgias.  Cough not worse, just not getting better.  No SOB.  No pleuritic pain.  Sore across chest from coughing  Patient is a 35 y.o. female presenting with flu symptoms. The history is provided by the patient.  Influenza This is a recurrent problem. Episode onset: 5 weeks ago. Pertinent negatives include no chest pain, no abdominal pain, no headaches and no shortness of breath.    Past Medical History  Diagnosis Date  . Anxiety   . Depression     Past Surgical History  Procedure Date  . Cholecystectomy   . Tubal ligation   . Tubal ligation     Family History  Problem Relation Age of Onset  . Pneumonia Other     History  Substance Use Topics  . Smoking status: Current Everyday Smoker -- 1.0 packs/day  . Smokeless tobacco: Not on file  . Alcohol Use: No     rare    OB History    Grav Para Term Preterm Abortions TAB SAB Ect Mult Living                  Review of Systems  Constitutional: Positive for fever and fatigue. Negative for chills and diaphoresis.  HENT: Positive for congestion. Negative for rhinorrhea and sneezing.   Eyes: Negative.   Respiratory: Positive for cough and wheezing. Negative for chest tightness and shortness of breath.   Cardiovascular: Negative for chest pain and leg swelling.  Gastrointestinal: Negative for  nausea, vomiting, abdominal pain, diarrhea and blood in stool.  Genitourinary: Negative for frequency, hematuria, flank pain and difficulty urinating.  Musculoskeletal: Negative for back pain and arthralgias.  Skin: Negative for rash.  Neurological: Negative for dizziness, speech difficulty, weakness, numbness and headaches.    Allergies  Review of patient's allergies indicates no known allergies.  Home Medications   Current Outpatient Rx  Name Route Sig Dispense Refill  . ACETAMINOPHEN-CODEINE #3 300-30 MG PO TABS Oral Take 1-2 tablets by mouth every 6 (six) hours as needed for pain. 15 tablet 0  . ALPRAZOLAM 1 MG PO TABS Oral Take 1 mg by mouth 4 (four) times daily. scheduled     . BENZONATATE 100 MG PO CAPS Oral Take 100 mg by mouth 3 (three) times daily as needed. Pt could not fill this because of cost     . OLANZAPINE-FLUOXETINE HCL 6-25 MG PO CAPS Oral Take 1 capsule by mouth every evening.      . AZITHROMYCIN 250 MG PO TABS Oral Take 250 mg by mouth daily. Take first 2 tablets together, then 1 every day until finished. Pt states didn't start this beacause of cost     . DOXYCYCLINE HYCLATE 100 MG PO CAPS Oral Take 1 capsule (100 mg total) by mouth 2 (two) times daily. 20 capsule 0    BP 134/88  Pulse 103  Temp(Src)  98 F (36.7 C) (Oral)  Resp 20  Ht 5\' 9"  (1.753 m)  Wt 170 lb (77.111 kg)  BMI 25.10 kg/m2  SpO2 99%  LMP 07/03/2011  Physical Exam  Constitutional: She is oriented to person, place, and time. She appears well-developed and well-nourished.  HENT:  Head: Normocephalic and atraumatic.  Eyes: Pupils are equal, round, and reactive to light.  Neck: Normal range of motion. Neck supple.  Cardiovascular: Normal rate, regular rhythm and normal heart sounds.   Pulmonary/Chest: Effort normal. No respiratory distress. She has wheezes. She has no rales. She exhibits no tenderness.       Minimal wheezes to bases  Abdominal: Soft. Bowel sounds are normal. There is no  tenderness. There is no rebound and no guarding.  Musculoskeletal: Normal range of motion. She exhibits no edema and no tenderness.  Lymphadenopathy:    She has no cervical adenopathy.  Neurological: She is alert and oriented to person, place, and time.  Skin: Skin is warm and dry. No rash noted.  Psychiatric: She has a normal mood and affect.    ED Course  Procedures (including critical care time)  Labs Reviewed - No data to display No results found.   1. Bronchitis       MDM  Pt well appearing.  Recent CXR neg for pneumonia.  Afebrile here.  sats normal.  Exam not consistent with PE.  Will prescribe cheaper abx, give shot of decadron, still use MDI, tylenol with codeine for cough        Rolan Bucco, MD 08/04/11 1326

## 2011-08-04 NOTE — ED Notes (Signed)
Pt reports more ease with breathing as a result of nebulizer treatments.

## 2011-08-04 NOTE — ED Notes (Signed)
Pt states she was at [facility] yesterday and was prescribed a z-pack. Pt states this morning her symptoms have gotten worst. Pt c/o body aches, chills and fever. Pt has productive green cough

## 2011-09-01 ENCOUNTER — Emergency Department (HOSPITAL_COMMUNITY)
Admission: EM | Admit: 2011-09-01 | Discharge: 2011-09-02 | Disposition: A | Discharge status: Home / Self Care | Payer: Self-pay | Attending: Emergency Medicine | Admitting: Emergency Medicine

## 2011-09-01 DIAGNOSIS — R630 Anorexia: Secondary | ICD-10-CM | POA: Insufficient documentation

## 2011-09-01 DIAGNOSIS — Z79899 Other long term (current) drug therapy: Secondary | ICD-10-CM | POA: Insufficient documentation

## 2011-09-01 DIAGNOSIS — R5381 Other malaise: Secondary | ICD-10-CM | POA: Insufficient documentation

## 2011-09-01 DIAGNOSIS — R112 Nausea with vomiting, unspecified: Secondary | ICD-10-CM | POA: Insufficient documentation

## 2011-09-01 DIAGNOSIS — R5383 Other fatigue: Secondary | ICD-10-CM | POA: Insufficient documentation

## 2011-09-01 DIAGNOSIS — F341 Dysthymic disorder: Secondary | ICD-10-CM | POA: Insufficient documentation

## 2011-09-01 DIAGNOSIS — R51 Headache: Secondary | ICD-10-CM | POA: Insufficient documentation

## 2011-09-01 NOTE — ED Notes (Signed)
Pt complains of anxiety, chest wall pain upon deep breath, pt under a lot of stress lately, has been dx but doesn't like taking her medications

## 2011-09-02 ENCOUNTER — Encounter (HOSPITAL_COMMUNITY): Payer: Self-pay | Admitting: *Deleted

## 2011-09-02 MED ORDER — DEXAMETHASONE SODIUM PHOSPHATE 10 MG/ML IJ SOLN
10.0000 mg | Freq: Once | INTRAMUSCULAR | Status: AC
  Administered 2011-09-02: 10 mg via INTRAVENOUS
  Filled 2011-09-02: qty 1

## 2011-09-02 MED ORDER — METOCLOPRAMIDE HCL 5 MG/ML IJ SOLN
10.0000 mg | Freq: Once | INTRAMUSCULAR | Status: AC
  Administered 2011-09-02: 10 mg via INTRAVENOUS
  Filled 2011-09-02: qty 2

## 2011-09-02 MED ORDER — KETOROLAC TROMETHAMINE 30 MG/ML IJ SOLN
30.0000 mg | Freq: Once | INTRAMUSCULAR | Status: AC
  Administered 2011-09-02: 30 mg via INTRAMUSCULAR
  Filled 2011-09-02: qty 1

## 2011-09-02 MED ORDER — PROMETHAZINE HCL 12.5 MG PO TABS: 12.5000 mg | ORAL_TABLET | Freq: Four times a day (QID) | ORAL | Status: AC | PRN

## 2011-09-02 MED ORDER — DIPHENHYDRAMINE HCL 50 MG/ML IJ SOLN
12.5000 mg | Freq: Once | INTRAMUSCULAR | Status: AC
  Administered 2011-09-02: 12.5 mg via INTRAMUSCULAR
  Filled 2011-09-02: qty 1

## 2011-09-02 NOTE — ED Notes (Signed)
Pt states she feels dehydrated. States she hasn't been able to keep down "anything" for 14 hrs. States she is under a lot of stress. Pt also states she has a migraine headache for past 2 days. Pt has taken Goody's powders and Advil, Aleve for headache but pain still won't go away.

## 2011-09-02 NOTE — ED Provider Notes (Signed)
History     CSN: [id number]  Arrival date & time 09/01/11  2332   First MD Initiated Contact with Patient 09/02/11 [phone]      Chief Complaint  Patient presents with  . Anxiety    pt c/o migraine that began saturday with chest pain and vomiting.     (Consider location/radiation/quality/duration/timing/severity/associated sxs/prior treatment) Patient is a 36 y.o. female presenting with headaches. The history is provided by the patient.  Headache  This is a new problem. The current episode started more than 2 days ago. The problem occurs constantly. The problem has not changed since onset.The headache is associated with bright light, activity and loud noise. The pain is located in the temporal and frontal region. The quality of the pain is described as dull. The pain is at a severity of 6/10. The pain is mild. Associated symptoms include anorexia, malaise/fatigue, nausea and vomiting. Pertinent negatives include no fever, no orthopnea, no palpitations and no shortness of breath.  Pt states she is having a migraine. Nausea vomiting, unable to keep anything down for two days. States took advil with no relief. Denies fever, chills, nasal congestion, sore throat, shortness of breath, cough.   Past Medical History  Diagnosis Date  . Anxiety   . Depression     Past Surgical History  Procedure Date  . Cholecystectomy   . Tubal ligation   . Tubal ligation     Family History  Problem Relation Age of Onset  . Pneumonia Other     History  Substance Use Topics  . Smoking status: Current Everyday Smoker -- 1.0 packs/day    Types: Cigarettes  . Smokeless tobacco: Not on file  . Alcohol Use: No     rare    OB History    Grav Para Term Preterm Abortions TAB SAB Ect Mult Living                  Review of Systems  Constitutional: Positive for malaise/fatigue. Negative for fever and chills.  HENT: Negative for ear pain, congestion, sore throat, neck pain and neck stiffness.   Eyes:  Negative for pain and redness.  Respiratory: Negative for shortness of breath.   Cardiovascular: Negative for palpitations and orthopnea.  Gastrointestinal: Positive for nausea, vomiting and anorexia. Negative for abdominal pain.  Genitourinary: Negative.   Musculoskeletal: Negative.   Skin: Negative.   Neurological: Positive for headaches.  Psychiatric/Behavioral: Negative.     Allergies  Review of patient's allergies indicates no known allergies.  Home Medications   Current Outpatient Rx  Name Route Sig Dispense Refill  . ALPRAZOLAM 1 MG PO TABS Oral Take 1 mg by mouth 4 (four) times daily. scheduled     . NAPROXEN SODIUM 220 MG PO TABS Oral Take 440 mg by mouth 2 (two) times daily with a meal.    . OLANZAPINE-FLUOXETINE HCL 6-25 MG PO CAPS Oral Take 1 capsule by mouth every evening.       BP 139/93  Pulse 73  Temp(Src) 98 F (36.7 C) (Oral)  Resp 17  SpO2 100%  LMP 08/19/2011  Physical Exam  Nursing note and vitals reviewed. Constitutional: She is oriented to person, place, and time. She appears well-developed and well-nourished.  HENT:  Head: Normocephalic and atraumatic.  Right Ear: External ear normal.  Left Ear: External ear normal.  Nose: Nose normal.  Mouth/Throat: Oropharynx is clear and moist.  Eyes: Conjunctivae are normal. Pupils are equal, round, and reactive to light.  Neck: Normal  range of motion. Neck supple.  Cardiovascular: Normal rate, regular rhythm and normal heart sounds.   Pulmonary/Chest: Breath sounds normal. No respiratory distress. She has no wheezes.  Abdominal: Soft. Bowel sounds are normal. There is no tenderness.  Neurological: She is alert and oriented to person, place, and time. She has normal reflexes. No cranial nerve deficit. Coordination normal.  Skin: Skin is warm and dry.    ED Course  Procedures (including critical care time)  Pt with headache. Will try medications. Normal neuro exam, no head injury. VS normal.  8:18  AM Pt reassessed, continues to have headache. No n/v. Pt sleeping each time I walk into the room. Will d/c home.   MDM         Lottie Mussel, PA 09/02/11 [phone]

## 2011-09-02 NOTE — ED Provider Notes (Signed)
Medical screening examination/treatment/procedure(s) were performed by non-physician practitioner and as supervising physician I was immediately available for consultation/collaboration.   Alvenia Treese A. Grace Haggart, MD 09/02/11 2258 

## 2011-09-02 NOTE — ED Notes (Signed)
Continues to sleep. Had to be awakened to get VS.

## 2011-09-15 ENCOUNTER — Emergency Department (HOSPITAL_COMMUNITY): Payer: Self-pay

## 2011-09-15 ENCOUNTER — Emergency Department (HOSPITAL_COMMUNITY)
Admission: EM | Admit: 2011-09-15 | Discharge: 2011-09-15 | Disposition: A | Discharge status: Home / Self Care | Payer: Self-pay | Attending: Emergency Medicine | Admitting: Emergency Medicine

## 2011-09-15 ENCOUNTER — Encounter (HOSPITAL_COMMUNITY): Payer: Self-pay | Admitting: *Deleted

## 2011-09-15 DIAGNOSIS — M7989 Other specified soft tissue disorders: Secondary | ICD-10-CM | POA: Insufficient documentation

## 2011-09-15 DIAGNOSIS — R269 Unspecified abnormalities of gait and mobility: Secondary | ICD-10-CM | POA: Insufficient documentation

## 2011-09-15 DIAGNOSIS — S93409A Sprain of unspecified ligament of unspecified ankle, initial encounter: Secondary | ICD-10-CM | POA: Insufficient documentation

## 2011-09-15 DIAGNOSIS — S8990XA Unspecified injury of unspecified lower leg, initial encounter: Secondary | ICD-10-CM | POA: Insufficient documentation

## 2011-09-15 DIAGNOSIS — M79609 Pain in unspecified limb: Secondary | ICD-10-CM | POA: Insufficient documentation

## 2011-09-15 DIAGNOSIS — S93401A Sprain of unspecified ligament of right ankle, initial encounter: Secondary | ICD-10-CM

## 2011-09-15 DIAGNOSIS — F341 Dysthymic disorder: Secondary | ICD-10-CM | POA: Insufficient documentation

## 2011-09-15 DIAGNOSIS — Z79899 Other long term (current) drug therapy: Secondary | ICD-10-CM | POA: Insufficient documentation

## 2011-09-15 DIAGNOSIS — M79671 Pain in right foot: Secondary | ICD-10-CM

## 2011-09-15 DIAGNOSIS — M25473 Effusion, unspecified ankle: Secondary | ICD-10-CM | POA: Insufficient documentation

## 2011-09-15 DIAGNOSIS — M25476 Effusion, unspecified foot: Secondary | ICD-10-CM | POA: Insufficient documentation

## 2011-09-15 DIAGNOSIS — X500XXA Overexertion from strenuous movement or load, initial encounter: Secondary | ICD-10-CM | POA: Insufficient documentation

## 2011-09-15 MED ORDER — OXYCODONE-ACETAMINOPHEN 5-325 MG PO TABS
1.0000 | ORAL_TABLET | Freq: Once | ORAL | Status: AC
  Administered 2011-09-15: 1 via ORAL
  Filled 2011-09-15: qty 1

## 2011-09-15 MED ORDER — HYDROCODONE-ACETAMINOPHEN 5-325 MG PO TABS: 1.0000 | ORAL_TABLET | ORAL | Status: AC | PRN

## 2011-09-15 NOTE — ED Notes (Signed)
Pt c/o rt foot pain since Saturday when she  Twisted it.

## 2011-09-15 NOTE — ED Provider Notes (Signed)
History     CSN: [id number]  Arrival date & time 09/15/11  2135   First MD Initiated Contact with Patient 09/15/11 2237      Chief Complaint  Patient presents with  . Foot Injury    (Consider location/radiation/quality/duration/timing/severity/associated sxs/prior treatment) Patient is a 36 y.o. female presenting with lower extremity pain. The history is provided by the patient.  Foot Pain This is a new problem. The current episode started in the past 7 days. The problem occurs constantly. The problem has been unchanged. Associated symptoms include joint swelling. Pertinent negatives include no numbness or weakness. The symptoms are aggravated by walking. She has tried NSAIDs, immobilization, ice and acetaminophen for the symptoms. The treatment provided mild relief.   Pt states she suffered an inversion injury to her ankle when she stepped in a hole on Saturday. Since that time she has had pain to the lateral aspect of her foot. She has noted mild swelling to the area. Has tried RICE and NSAIDs with no relief. Pain worsens with wt bearing.  Past Medical History  Diagnosis Date  . Anxiety   . Depression     Past Surgical History  Procedure Date  . Cholecystectomy   . Tubal ligation   . Tubal ligation     Family History  Problem Relation Age of Onset  . Pneumonia Other     History  Substance Use Topics  . Smoking status: Current Everyday Smoker -- 1.0 packs/day    Types: Cigarettes  . Smokeless tobacco: Not on file  . Alcohol Use: No     rare    OB History    Grav Para Term Preterm Abortions TAB SAB Ect Mult Living                  Review of Systems  Constitutional: Negative.   Musculoskeletal: Positive for joint swelling and gait problem.  Skin: Negative.   Neurological: Negative for weakness and numbness.    Allergies  Review of patient's allergies indicates no known allergies.  Home Medications   Current Outpatient Rx  Name Route Sig Dispense  Refill  . ACETAMINOPHEN 500 MG PO TABS Oral Take 1,000 mg by mouth every 6 (six) hours as needed. For pain    . ALPRAZOLAM 1 MG PO TABS Oral Take 1 mg by mouth 4 (four) times daily. scheduled    . AMPHETAMINE-DEXTROAMPHETAMINE 20 MG PO TABS Oral Take 20 mg by mouth 4 (four) times daily.    Marland Kitchen OLANZAPINE-FLUOXETINE HCL 6-25 MG PO CAPS Oral Take 1 capsule by mouth every evening.       BP 125/80  Pulse 105  Temp(Src) 98.7 F (37.1 C) (Oral)  Resp 19  SpO2 96%  LMP 08/19/2011  Physical Exam  Nursing note and vitals reviewed. Constitutional: She is oriented to person, place, and time. She appears well-developed and well-nourished. No distress.  HENT:  Head: Normocephalic and atraumatic.  Musculoskeletal: Normal range of motion.       Right ankle: Normal.       Right foot: She exhibits tenderness and swelling. She exhibits normal range of motion and no deformity.       Mild tenderness over 3rd-5th MTs. Very subtle swelling of this area. No tenderness at 5th MT head. FROM of toes. Foot neurovasc intact with sensory intact to lt touch; good DP/PT pulses. Cap refill <3.  Neurological: She is alert and oriented to person, place, and time.  Skin: Skin is warm and dry. She is  not diaphoretic.  Psychiatric: She has a normal mood and affect.    ED Course  Procedures (including critical care time)  Labs Reviewed - No data to display Dg Foot Complete Right  09/15/2011  *RADIOLOGY REPORT*  Clinical Data: Diffuse and foot pain after twisting injury.  RIGHT FOOT COMPLETE - 3+ VIEW  Comparison: 01/10/2009  Findings: The right foot appears intact. No evidence of acute fracture or subluxation.  No focal bone lesions.  Bone matrix and cortex appear intact.  No abnormal radiopaque densities in the soft tissues.  Mild soft tissue swelling over the dorsum.  Small accessory ossicle over the cuboidal bone.  No significant change since prior study.  IMPRESSION: No acute bony abnormalities demonstrated.  Original  Report Authenticated By: Marlon Pel, M.D.  I personally reviewed the pt's plain films.   1. Right ankle sprain   2. Foot pain, right       MDM  Pt with foot pain s/p spraining ankle. Plain films with no evidence of fx. Suspect likely sprain. Will keep pt non wt bearing on crutches and give ASO. Instructed to cont RICE, NSAIDs and f/u with PCP if not improving. Return precautions discussed.        Grant Fontana, [place] 09/16/11 [phone]

## 2011-09-17 NOTE — ED Provider Notes (Signed)
Medical screening examination/treatment/procedure(s) were performed by non-physician practitioner and as supervising physician I was immediately available for consultation/collaboration.   Farris Blash L Jadie Allington, MD 09/17/11 2306 

## 2011-10-08 ENCOUNTER — Emergency Department (HOSPITAL_COMMUNITY)
Admission: EM | Admit: 2011-10-08 | Discharge: 2011-10-08 | Discharge status: Against Medical Advice | Payer: Self-pay | Attending: Emergency Medicine | Admitting: Emergency Medicine

## 2011-10-08 ENCOUNTER — Encounter (HOSPITAL_COMMUNITY): Payer: Self-pay

## 2011-10-08 DIAGNOSIS — M79609 Pain in unspecified limb: Secondary | ICD-10-CM | POA: Insufficient documentation
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
## 2011-10-08 NOTE — ED Notes (Signed)
Unable to locate pt in waiting room, triage, or lobby. x2

## 2011-10-08 NOTE — ED Notes (Signed)
Unable to locate pt in waiting room or lobby

## 2011-10-15 ENCOUNTER — Emergency Department (HOSPITAL_COMMUNITY): Payer: Self-pay

## 2011-10-15 ENCOUNTER — Emergency Department (HOSPITAL_COMMUNITY)
Admission: EM | Admit: 2011-10-15 | Discharge: 2011-10-15 | Disposition: A | Discharge status: Home / Self Care | Payer: Self-pay | Attending: Emergency Medicine | Admitting: Emergency Medicine

## 2011-10-15 ENCOUNTER — Encounter (HOSPITAL_COMMUNITY): Payer: Self-pay | Admitting: *Deleted

## 2011-10-15 DIAGNOSIS — M542 Cervicalgia: Secondary | ICD-10-CM | POA: Insufficient documentation

## 2011-10-15 DIAGNOSIS — F3289 Other specified depressive episodes: Secondary | ICD-10-CM | POA: Insufficient documentation

## 2011-10-15 DIAGNOSIS — F17200 Nicotine dependence, unspecified, uncomplicated: Secondary | ICD-10-CM | POA: Insufficient documentation

## 2011-10-15 DIAGNOSIS — F329 Major depressive disorder, single episode, unspecified: Secondary | ICD-10-CM | POA: Insufficient documentation

## 2011-10-15 DIAGNOSIS — R000 Tachycardia, unspecified: Secondary | ICD-10-CM | POA: Insufficient documentation

## 2011-10-15 DIAGNOSIS — T07XXXA Unspecified multiple injuries, initial encounter: Secondary | ICD-10-CM | POA: Insufficient documentation

## 2011-10-15 DIAGNOSIS — R079 Chest pain, unspecified: Secondary | ICD-10-CM | POA: Insufficient documentation

## 2011-10-15 DIAGNOSIS — F411 Generalized anxiety disorder: Secondary | ICD-10-CM | POA: Insufficient documentation

## 2011-10-15 MED ORDER — OXYCODONE-ACETAMINOPHEN 5-325 MG PO TABS
2.0000 | ORAL_TABLET | Freq: Once | ORAL | Status: AC
  Administered 2011-10-15: 2 via ORAL
  Filled 2011-10-15: qty 2

## 2011-10-15 MED ORDER — IBUPROFEN 800 MG PO TABS
800.0000 mg | ORAL_TABLET | Freq: Once | ORAL | Status: AC
  Administered 2011-10-15: 800 mg via ORAL
  Filled 2011-10-15: qty 1

## 2011-10-15 MED ORDER — DIAZEPAM 5 MG PO TABS
5.0000 mg | ORAL_TABLET | Freq: Once | ORAL | Status: AC
  Administered 2011-10-15: 5 mg via ORAL
  Filled 2011-10-15: qty 1

## 2011-10-15 MED ORDER — OXYCODONE-ACETAMINOPHEN 5-325 MG PO TABS: 2.0000 | ORAL_TABLET | ORAL | Status: AC | PRN
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
## 2011-10-15 NOTE — Discharge Instructions (Signed)
Contusion A bruise (contusion) or hematoma is a collection of blood under skin causing an area of discoloration. It is caused by an injury to blood vessels beneath the injured area with a release of blood into that area. As blood accumulates it is known as a hematoma. This collection of blood causes a blue to dark blue color. As the injury improves over days to weeks it turns to a yellowish color and then usually disappears completely over the same period of time. These generally resolve completely without problems. The hematoma rarely requires drainage. HOME CARE INSTRUCTIONS   Apply ice to the injured area for 15 to 20 minutes 3 to 4 times per day for the first 1 or 2 days.   Put the ice in a plastic bag and place a towel between the bag of ice and your skin. Discontinue the ice if it causes pain.   If bleeding is more than just a little, apply pressure to the area for at least thirty minutes to decrease the amount of bruising. Apply pressure and ice as your caregiver suggests.   If the injury is on an extremity, elevation of that part may help to decrease pain and swelling. Wrapping with an ace or supportive wrap may also be helpful. If the bruise is on a lower extremity and is painful, crutches may be helpful for a couple days.   If you have been given a tetanus shot because the skin was broken, your arm may get swollen, red and warm to touch at the shot site. This is a normal response to the medicine in the shot. If you did not receive a tetanus shot today because you did not recall when your last one was given, make sure to check with your caregiver's office and determine if one is needed. Generally for a "dirty" wound, you should receive a tetanus booster if you have not had one in the last five years. If you have a "clean" wound, you should receive a tetanus booster if you have not had one within the last ten years.  SEEK MEDICAL CARE IF:   You have pain not controlled with over the counter  medications. Only take over-the-counter or prescription medicines for pain, discomfort, or fever as directed by your caregiver. Do not use aspirin as it may cause bleeding.   You develop increasing pain or swelling in the area of injury.   You develop any problems which seem worse than the problems which brought you in.  SEEK IMMEDIATE MEDICAL CARE IF:   You have a fever.   You develop severe pain in the area of the bruise out of proportion to the initial injury.   The bruised area becomes red, tender, and swollen.  MAKE SURE YOU:   Understand these instructions.   Will watch your condition.   Will get help right away if you are not doing well or get worse.  Document Released: 05/14/2005 Document Revised: 04/16/2011 Document Reviewed: 03/22/2008 [employer] Patient Information 2012 [employer]. 
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
## 2011-11-02 ENCOUNTER — Emergency Department (HOSPITAL_COMMUNITY): Payer: Self-pay

## 2011-11-02 ENCOUNTER — Encounter (HOSPITAL_COMMUNITY): Payer: Self-pay | Admitting: Emergency Medicine

## 2011-11-02 ENCOUNTER — Emergency Department (HOSPITAL_COMMUNITY)
Admission: EM | Admit: 2011-11-02 | Discharge: 2011-11-02 | Disposition: A | Discharge status: Home / Self Care | Payer: Self-pay | Attending: Emergency Medicine | Admitting: Emergency Medicine

## 2011-11-02 DIAGNOSIS — F17200 Nicotine dependence, unspecified, uncomplicated: Secondary | ICD-10-CM | POA: Insufficient documentation

## 2011-11-02 DIAGNOSIS — Z760 Encounter for issue of repeat prescription: Secondary | ICD-10-CM | POA: Insufficient documentation

## 2011-11-02 DIAGNOSIS — S9030XA Contusion of unspecified foot, initial encounter: Secondary | ICD-10-CM | POA: Insufficient documentation

## 2011-11-02 DIAGNOSIS — Z79899 Other long term (current) drug therapy: Secondary | ICD-10-CM | POA: Insufficient documentation

## 2011-11-02 DIAGNOSIS — F411 Generalized anxiety disorder: Secondary | ICD-10-CM | POA: Insufficient documentation

## 2011-11-02 DIAGNOSIS — M79609 Pain in unspecified limb: Secondary | ICD-10-CM | POA: Insufficient documentation

## 2011-11-02 DIAGNOSIS — S93401A Sprain of unspecified ligament of right ankle, initial encounter: Secondary | ICD-10-CM

## 2011-11-02 DIAGNOSIS — F3289 Other specified depressive episodes: Secondary | ICD-10-CM | POA: Insufficient documentation

## 2011-11-02 DIAGNOSIS — M25579 Pain in unspecified ankle and joints of unspecified foot: Secondary | ICD-10-CM | POA: Insufficient documentation

## 2011-11-02 DIAGNOSIS — S93409A Sprain of unspecified ligament of unspecified ankle, initial encounter: Secondary | ICD-10-CM | POA: Insufficient documentation

## 2011-11-02 DIAGNOSIS — S9031XA Contusion of right foot, initial encounter: Secondary | ICD-10-CM

## 2011-11-02 DIAGNOSIS — F329 Major depressive disorder, single episode, unspecified: Secondary | ICD-10-CM | POA: Insufficient documentation

## 2011-11-02 MED ORDER — MELOXICAM 7.5 MG PO TABS: 7.5000 mg | ORAL_TABLET | Freq: Every day | ORAL | Status: DC

## 2011-11-02 MED ORDER — ALPRAZOLAM 1 MG PO TABS: 1.0000 mg | ORAL_TABLET | Freq: Every evening | ORAL | Status: AC | PRN
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
## 2011-11-02 NOTE — ED Notes (Signed)
Pt a/ox4. Resp even and unlabored. NAD at this time. D/ C instructions reviewed with pt. Pt verbalized understanding. Pt ambulated to d/c desk with steady gate.  

## 2011-11-02 NOTE — ED Notes (Signed)
ICE applied to right foot.

## 2011-11-02 NOTE — ED Provider Notes (Signed)
Medical screening examination/treatment/procedure(s) were performed by non-physician practitioner and as supervising physician I was immediately available for consultation/collaboration.   Yechezkel Fertig L Darrill Vreeland, MD 11/02/11 1513 

## 2011-11-02 NOTE — Discharge Instructions (Signed)
Ankle Sprain An ankle sprain is an injury to the strong, fibrous tissues (ligaments) that hold the bones of your ankle joint together.  CAUSES Ankle sprain usually is caused by a fall or by twisting your ankle. People who participate in sports are more prone to these types of injuries.  SYMPTOMS  Symptoms of ankle sprain include:  Pain in your ankle. The pain may be present at rest or only when you are trying to stand or walk.   Swelling.   Bruising. Bruising may develop immediately or within 1 to 2 days after your injury.   Difficulty standing or walking.  DIAGNOSIS  Your caregiver will ask you details about your injury and perform a physical exam of your ankle to determine if you have an ankle sprain. During the physical exam, your caregiver will press and squeeze specific areas of your foot and ankle. Your caregiver will try to move your ankle in certain ways. An X-ray exam may be done to be sure a bone was not broken or a ligament did not separate from one of the bones in your ankle (avulsion).  TREATMENT  Certain types of braces can help stabilize your ankle. Your caregiver can make a recommendation for this. Your caregiver may recommend the use of medication for pain. If your sprain is severe, your caregiver may refer you to a surgeon who helps to restore function to parts of your skeletal system (orthopedist) or a physical therapist. HOME CARE INSTRUCTIONS  Apply ice to your injury for 1 to 2 days or as directed by your caregiver. Applying ice helps to reduce inflammation and pain.  Put ice in a plastic bag.   Place a towel between your skin and the bag.   Leave the ice on for 15 to 20 minutes at a time, every 2 hours while you are awake.   Take over-the-counter or prescription medicines for pain, discomfort, or fever only as directed by your caregiver.   Keep your injured leg elevated, when possible, to lessen swelling.   If your caregiver recommends crutches, use them as  instructed. Gradually, put weight on the affected ankle. Continue to use crutches or a cane until you can walk without feeling pain in your ankle.   If you have a plaster splint, wear the splint as directed by your caregiver. Do not rest it on anything harder than a pillow the first 24 hours. Do not put weight on it. Do not get it wet. You may take it off to take a shower or bath.   You may have been given an elastic bandage to wear around your ankle to provide support. If the elastic bandage is too tight (you have numbness or tingling in your foot or your foot becomes cold and blue), adjust the bandage to make it comfortable.   If you have an air splint, you may blow more air into it or let air out to make it more comfortable. You may take your splint off at night and before taking a shower or bath.   Wiggle your toes in the splint several times per day if you are able.  SEEK MEDICAL CARE IF:   You have an increase in bruising, swelling, or pain.   Your toes feel cold.   Pain relief is not achieved with medication.  SEEK IMMEDIATE MEDICAL CARE IF: Your toes are numb or blue or you have severe pain. MAKE SURE YOU:   Understand these instructions.   Will watch your condition.     Will get help right away if you are not doing well or get worse.  Document Released: 08/04/2005 Document Revised: 07/24/2011 Document Reviewed: 03/08/2008 [facility] Patient Information 2012 [place], [place].Contusion A contusion is a deep bruise. Contusions are the result of an injury that caused bleeding under the skin. The contusion may turn blue, purple, or yellow. Minor injuries will give you a painless contusion, but more severe contusions may stay painful and swollen for a few weeks.  CAUSES  A contusion is usually caused by a blow, trauma, or direct force to an area of the body. SYMPTOMS   Swelling and redness of the injured area.   Bruising of the injured area.   Tenderness and soreness of the injured  area.   Pain.  DIAGNOSIS  The diagnosis can be made by taking a history and physical exam. An X-ray, CT scan, or MRI may be needed to determine if there were any associated injuries, such as fractures. TREATMENT  Specific treatment will depend on what area of the body was injured. In general, the best treatment for a contusion is resting, icing, elevating, and applying cold compresses to the injured area. Over-the-counter medicines may also be recommended for pain control. Ask your caregiver what the best treatment is for your contusion. HOME CARE INSTRUCTIONS   Put ice on the injured area.   Put ice in a plastic bag.   Place a towel between your skin and the bag.   Leave the ice on for 15 to 20 minutes, 3 to 4 times a day.   Only take over-the-counter or prescription medicines for pain, discomfort, or fever as directed by your caregiver. Your caregiver may recommend avoiding anti-inflammatory medicines (aspirin, ibuprofen, and naproxen) for 48 hours because these medicines may increase bruising.   Rest the injured area.   If possible, elevate the injured area to reduce swelling.  SEEK IMMEDIATE MEDICAL CARE IF:   You have increased bruising or swelling.   You have pain that is getting worse.   Your swelling or pain is not relieved with medicines.  MAKE SURE YOU:   Understand these instructions.   Will watch your condition.   Will get help right away if you are not doing well or get worse.  Document Released: 05/14/2005 Document Revised: 07/24/2011 Document Reviewed: 06/09/2011 [facility] Patient Information 2012 [place], [place].    Take the meds as directed.  Follow up  With one of the orthopedic referrals and with your psychiatrist.

## 2011-11-02 NOTE — ED Notes (Signed)
ASO splint placed to right ankle per verbal order from [facility] PA. CSM intact. Pulse palp. Cap refill under 3 seconds.
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
## 2012-03-18 ENCOUNTER — Emergency Department (HOSPITAL_COMMUNITY): Payer: Self-pay

## 2012-03-18 ENCOUNTER — Encounter (HOSPITAL_COMMUNITY): Payer: Self-pay | Admitting: *Deleted

## 2012-03-18 ENCOUNTER — Emergency Department (HOSPITAL_COMMUNITY)
Admission: EM | Admit: 2012-03-18 | Discharge: 2012-03-18 | Disposition: A | Discharge status: Home / Self Care | Payer: Self-pay | Attending: Emergency Medicine | Admitting: Emergency Medicine

## 2012-03-18 DIAGNOSIS — W07XXXA Fall from chair, initial encounter: Secondary | ICD-10-CM | POA: Insufficient documentation

## 2012-03-18 DIAGNOSIS — M545 Low back pain, unspecified: Secondary | ICD-10-CM | POA: Insufficient documentation

## 2012-03-18 DIAGNOSIS — M25529 Pain in unspecified elbow: Secondary | ICD-10-CM | POA: Insufficient documentation

## 2012-03-18 DIAGNOSIS — M79609 Pain in unspecified limb: Secondary | ICD-10-CM | POA: Insufficient documentation

## 2012-03-18 DIAGNOSIS — T07XXXA Unspecified multiple injuries, initial encounter: Secondary | ICD-10-CM | POA: Insufficient documentation

## 2012-03-18 DIAGNOSIS — R609 Edema, unspecified: Secondary | ICD-10-CM | POA: Insufficient documentation

## 2012-03-18 LAB — URINE MICROSCOPIC-ADD ON

## 2012-03-18 LAB — POCT PREGNANCY, URINE: Preg Test, Ur: NEGATIVE

## 2012-03-18 LAB — URINALYSIS, ROUTINE W REFLEX MICROSCOPIC
Glucose, UA: NEGATIVE mg/dL
Hgb urine dipstick: NEGATIVE
Protein, ur: NEGATIVE mg/dL

## 2012-03-18 MED ORDER — HYDROMORPHONE HCL PF 1 MG/ML IJ SOLN
1.0000 mg | Freq: Once | INTRAMUSCULAR | Status: AC
  Administered 2012-03-18: 1 mg via INTRAVENOUS
  Filled 2012-03-18: qty 1

## 2012-03-18 MED ORDER — HYDROCODONE-ACETAMINOPHEN 5-325 MG PO TABS: 2.0000 | ORAL_TABLET | ORAL | Status: AC | PRN

## 2012-03-18 MED ORDER — METHOCARBAMOL 500 MG PO TABS: 500.0000 mg | ORAL_TABLET | Freq: Two times a day (BID) | ORAL | Status: AC

## 2012-03-18 MED ORDER — SODIUM CHLORIDE 0.9 % IV BOLUS (SEPSIS)
1000.0000 mL | Freq: Once | INTRAVENOUS | Status: AC
  Administered 2012-03-18: 1000 mL via INTRAVENOUS

## 2012-03-18 MED ORDER — ONDANSETRON HCL 4 MG/2ML IJ SOLN
4.0000 mg | Freq: Once | INTRAMUSCULAR | Status: AC
  Administered 2012-03-18: 4 mg via INTRAVENOUS
  Filled 2012-03-18: qty 2

## 2012-03-18 NOTE — ED Notes (Signed)
Pt reports for the past week has had difficulty urinating, requested urine to be checked for UTI.

## 2012-03-18 NOTE — ED Notes (Signed)
Pt reports was standing on a chair that broke yesterday.  Reports pain in left elbow, left thigh, lower back, and swelling and pain in r leg.  Strong pedal pulse present.  Bruising noted to left elbow and left thigh.

## 2012-03-18 NOTE — ED Notes (Signed)
Fell 1 week ago , was standing on a chair when fell.  Contusions to lt,  leg, says unable to stand on rt leg  No loc, Lt elbow hurts.

## 2012-03-18 NOTE — ED Provider Notes (Signed)
History  This chart was scribed for Toy Baker, MD by Ladona Ridgel Day. This patient was seen in room APA05/APA05 and the patient's care was started at 1445.   CSN: [id number]  Arrival date & time 03/18/12  1445   First MD Initiated Contact with Patient 03/18/12 1508      Chief Complaint  Patient presents with  . Fall    The history is provided by the patient. No language interpreter was used.   Amanda Carrillo is a 36 y.o. female who presents to the Emergency Department complaining of a fall yesterday off of a chair falling to her left side and bruising her left leg/left elbow. She states the chair gave out causing her to fall 3 feet and she states pain to her left leg/knee and left elbow; she denies any LOC. Her associated symptoms are lower back pain and swelling of her right leg/right groin since this AM when she woke up. She took good powders for pain without any relief from pain. She denies any other injuries or illnesses.   Past Medical History  Diagnosis Date  . Anxiety   . Depression     Past Surgical History  Procedure Date  . Cholecystectomy   . Tubal ligation   . Tubal ligation     Family History  Problem Relation Age of Onset  . Pneumonia Other     History  Substance Use Topics  . Smoking status: Current Everyday Smoker -- 1.0 packs/day    Types: Cigarettes  . Smokeless tobacco: Not on file  . Alcohol Use: Yes     wine monthly    OB History    Grav Para Term Preterm Abortions TAB SAB Ect Mult Living                  Review of Systems   A complete 10 system review of systems was obtained and all systems are negative except as noted in the HPI and PMH.   Allergies  Review of patient's allergies indicates no known allergies.  Home Medications   Current Outpatient Rx  Name Route Sig Dispense Refill  . ALPRAZOLAM 1 MG PO TABS Oral Take 1 mg by mouth 4 (four) times daily. scheduled    . GOODY HEADACHE PO Oral Take 1 packet by mouth as needed. FOR PAIN     . OLANZAPINE-FLUOXETINE HCL 6-25 MG PO CAPS Oral Take 1 capsule by mouth every evening.     . OXYCODONE-ACETAMINOPHEN 5-325 MG PO TABS Oral Take 1 tablet by mouth every 4 (four) hours as needed.      Triage Vitals: BP 108/69  Pulse 123  Temp 99.2 F (37.3 C) (Oral)  Resp 20  Ht 5\' 9"  (1.753 m)  Wt 217 lb (98.431 kg)  BMI 32.05 kg/m2  SpO2 96%  LMP 03/14/2012  Physical Exam  Nursing note and vitals reviewed. Constitutional: She is oriented to person, place, and time. She appears well-developed and well-nourished. No distress.  HENT:  Head: Normocephalic and atraumatic.  Eyes: EOM are normal.  Neck: Normal range of motion. Neck supple. No tracheal deviation present.  Cardiovascular: Normal rate, regular rhythm and normal heart sounds.        Right leg 1+ pitting edema and right ankle Left radial pulse 2+  Pulmonary/Chest: Effort normal and breath sounds normal. No respiratory distress. She exhibits no tenderness.  Abdominal: Soft. Bowel sounds are normal. She exhibits no distension. There is no tenderness. There is no rebound and no  guarding.  Musculoskeletal: Normal range of motion.       Bilateral lower lumbar spinal tenderness to palpation.  Left lateral thigh/left knee tenderness to palpation  Neurological: She is alert and oriented to person, place, and time.       RLE with subjective numbness but sensation and movement are normal.   Skin: Skin is warm and dry.       Left elbow contusion with full ROM  Left lateral distal thigh with ecchymosis    Psychiatric: She has a normal mood and affect. Her behavior is normal.    ED Course  Procedures (including critical care time) DIAGNOSTIC STUDIES: Oxygen Saturation is 96% on room air, adequate by my interpretation.    COORDINATION OF CARE: At 324 PM Discussed treatment plan with patient which includes pain medicine, lower back and pelvis X-ray. Patient agrees.    Labs Reviewed  POCT PREGNANCY, URINE  URINALYSIS,  ROUTINE W REFLEX MICROSCOPIC   Dg Lumbar Spine Complete  03/18/2012  *RADIOLOGY REPORT*  Clinical Data: Fall, back pain  LUMBAR SPINE - COMPLETE 4+ VIEW  Comparison: 07/09/2011  Findings: Prior cholecystectomy noted.  Normal alignment.  No compression fracture, wedge shaped deformity or focal kyphosis. Facets aligned.  No pars defects.  Normal pedicles and SI joints. Retained stool throughout the colon.  IMPRESSION: No acute osseous finding.  Original Report Authenticated By: Judie Petit. Ruel Favors, M.D.   Dg Pelvis 1-2 Views  03/18/2012  *RADIOLOGY REPORT*  Clinical Data: Fall, pain  PELVIS - 1-2 VIEW  Comparison: 02/23/2011  Findings: Pelvis and hips appear intact.  No fracture or malalignment.  Normal SI joints.  No diastasis.  Nonobstructive bowel gas pattern.  Retained stool throughout the colon.  IMPRESSION: No acute osseous finding or fracture  Original Report Authenticated By: Judie Petit. Ruel Favors, M.D.   Dg Elbow Complete Left  03/18/2012  *RADIOLOGY REPORT*  Clinical Data: Pain after fall  LEFT ELBOW - COMPLETE 3+ VIEW  Comparison:  None  Findings: There is no evidence of fracture or dislocation.  No joint effusion is identified.  IMPRESSION: Negative left elbow.  Original Report Authenticated By: Brandon Melnick, M.D.     No diagnosis found.    MDM  Pt given pain meds here for her mutiple contusions, right le swelling with neg doppler for dvt--repeat exam at time of d/c stable  I personally performed the services described in this documentation, which was scribed in my presence. The recorded information has been reviewed and considered.            Toy Baker, MD 03/18/12 [phone]

## 2012-04-09 ENCOUNTER — Other Ambulatory Visit: Payer: Self-pay | Admitting: Nephrology

## 2012-04-16 ENCOUNTER — Emergency Department (HOSPITAL_COMMUNITY)
Admission: EM | Admit: 2012-04-16 | Discharge: 2012-04-16 | Disposition: A | Discharge status: Home / Self Care | Payer: Self-pay | Attending: Emergency Medicine | Admitting: Emergency Medicine

## 2012-04-16 ENCOUNTER — Encounter (HOSPITAL_COMMUNITY): Payer: Self-pay | Admitting: *Deleted

## 2012-04-16 DIAGNOSIS — K029 Dental caries, unspecified: Secondary | ICD-10-CM | POA: Insufficient documentation

## 2012-04-16 DIAGNOSIS — F329 Major depressive disorder, single episode, unspecified: Secondary | ICD-10-CM | POA: Insufficient documentation

## 2012-04-16 DIAGNOSIS — F3289 Other specified depressive episodes: Secondary | ICD-10-CM | POA: Insufficient documentation

## 2012-04-16 DIAGNOSIS — F411 Generalized anxiety disorder: Secondary | ICD-10-CM | POA: Insufficient documentation

## 2012-04-16 DIAGNOSIS — K120 Recurrent oral aphthae: Secondary | ICD-10-CM | POA: Insufficient documentation

## 2012-04-16 DIAGNOSIS — F17200 Nicotine dependence, unspecified, uncomplicated: Secondary | ICD-10-CM | POA: Insufficient documentation

## 2012-04-16 MED ORDER — AMOXICILLIN 500 MG PO CAPS: 500.0000 mg | ORAL_CAPSULE | Freq: Three times a day (TID) | ORAL | Status: DC

## 2012-04-16 MED ORDER — AMOXICILLIN 250 MG PO CAPS
500.0000 mg | ORAL_CAPSULE | Freq: Once | ORAL | Status: AC
  Administered 2012-04-16: 500 mg via ORAL
  Filled 2012-04-16: qty 2

## 2012-04-16 MED ORDER — HYDROCODONE-ACETAMINOPHEN 5-325 MG PO TABS: 1.0000 | ORAL_TABLET | ORAL | Status: DC | PRN

## 2012-04-16 MED ORDER — HYDROCODONE-ACETAMINOPHEN 5-325 MG PO TABS
1.0000 | ORAL_TABLET | Freq: Once | ORAL | Status: AC
  Administered 2012-04-16: 1 via ORAL
  Filled 2012-04-16: qty 1

## 2012-04-16 NOTE — ED Provider Notes (Signed)
History     CSN: [id number]  Arrival date & time 04/16/12  4098   First MD Initiated Contact with Patient 04/16/12 1950      Chief Complaint  Patient presents with  . Dental Pain    (Consider location/radiation/quality/duration/timing/severity/associated sxs/prior treatment) HPI Comments: Amanda Carrillo  presents with a 3 day history of dental pain and gingival swelling.   The patient has a history of injury to the tooth involved which has recently started to cause pain.  There has been no fevers,  Chills, nausea or vomiting, also no complaint of difficulty swallowing,  Although chewing makes pain worse.  The patient has tried goody powders and has also been swishing her mouth with biotene as recommended by the dentist she will be seeing next week.  She has had no relief of symptoms.  She also describes a sore on the left edge of her tongue which has been painful.  The history is provided by the patient and the spouse.    Past Medical History  Diagnosis Date  . Anxiety   . Depression     Past Surgical History  Procedure Date  . Cholecystectomy   . Tubal ligation   . Tubal ligation     Family History  Problem Relation Age of Onset  . Pneumonia Other     History  Substance Use Topics  . Smoking status: Current Everyday Smoker -- 1.0 packs/day    Types: Cigarettes  . Smokeless tobacco: Not on file  . Alcohol Use: Yes     wine monthly    OB History    Grav Para Term Preterm Abortions TAB SAB Ect Mult Living                  Review of Systems  Constitutional: Negative for fever.  HENT: Positive for dental problem. Negative for congestion, sore throat, neck pain and neck stiffness.   Eyes: Negative.   Respiratory: Negative for shortness of breath.   Gastrointestinal: Negative for nausea.  Musculoskeletal: Negative for arthralgias.  Skin: Negative.  Negative for rash.  Neurological: Negative for headaches.    Allergies  Review of patient's allergies indicates  no known allergies.  Home Medications   Current Outpatient Rx  Name Route Sig Dispense Refill  . ALPRAZOLAM 1 MG PO TABS Oral Take 1 mg by mouth 4 (four) times daily. scheduled    . OLANZAPINE-FLUOXETINE HCL 6-25 MG PO CAPS Oral Take 1 capsule by mouth every evening.     Marlin Canary HEADACHE PO Oral Take 1 packet by mouth as needed. FOR PAIN      BP 130/77  Pulse 104  Temp 98.6 F (37 C) (Oral)  Resp 20  Ht 5\' 9"  (1.753 m)  Wt 234 lb (106.142 kg)  BMI 34.56 kg/m2  SpO2 96%  LMP 04/15/2012  Physical Exam  Constitutional: She is oriented to person, place, and time. She appears well-developed and well-nourished. No distress.  HENT:  Head: Normocephalic and atraumatic. No trismus in the jaw.  Right Ear: Tympanic membrane and external ear normal.  Left Ear: Tympanic membrane and external ear normal.  Mouth/Throat: Oropharynx is clear and moist and mucous membranes are normal. No oral lesions. Dental caries present.         Right upper second molar tooth with significant decay with surrounding gingival erythema and swelling without fluctuance.  Eyes: Conjunctivae are normal.  Neck: Normal range of motion. Neck supple.  Cardiovascular: Normal rate and normal heart sounds.  Pulmonary/Chest: Effort normal.  Abdominal: She exhibits no distension.  Musculoskeletal: Normal range of motion.  Lymphadenopathy:    She has no cervical adenopathy.  Neurological: She is alert and oriented to person, place, and time.  Skin: Skin is warm and dry. No erythema.  Psychiatric: She has a normal mood and affect.    ED Course  Procedures (including critical care time)  Labs Reviewed - No data to display No results found.   1. Dental decay   2. Aphthous ulcer of tongue       MDM  Patient was prescribed amoxicillin and hydrocodone.  She is encouraged to continue using her by obtaining as recommended by her dentist.  She will see him for further management of her dental pain and probable  early abscess in 1 week.        Burgess Amor, [place] 04/16/12 2041

## 2012-04-16 NOTE — ED Notes (Signed)
Pt c/o toothache, upper right molar x 2-3 days

## 2012-04-16 NOTE — ED Provider Notes (Signed)
Medical screening examination/treatment/procedure(s) were performed by non-physician practitioner and as supervising physician I was immediately available for consultation/collaboration.   Benny Lennert, MD 04/16/12 2121

## 2012-04-27 ENCOUNTER — Emergency Department (HOSPITAL_COMMUNITY)
Admission: EM | Admit: 2012-04-27 | Discharge: 2012-04-27 | Disposition: A | Discharge status: Home / Self Care | Payer: Self-pay | Attending: Emergency Medicine | Admitting: Emergency Medicine

## 2012-04-27 ENCOUNTER — Encounter (HOSPITAL_COMMUNITY): Payer: Self-pay | Admitting: *Deleted

## 2012-04-27 DIAGNOSIS — K0889 Other specified disorders of teeth and supporting structures: Secondary | ICD-10-CM

## 2012-04-27 DIAGNOSIS — F17200 Nicotine dependence, unspecified, uncomplicated: Secondary | ICD-10-CM | POA: Insufficient documentation

## 2012-04-27 DIAGNOSIS — K029 Dental caries, unspecified: Secondary | ICD-10-CM | POA: Insufficient documentation

## 2012-04-27 MED ORDER — HYDROCODONE-ACETAMINOPHEN 5-325 MG PO TABS: 1.0000 | ORAL_TABLET | Freq: Four times a day (QID) | ORAL | Status: AC | PRN

## 2012-04-27 MED ORDER — HYDROCODONE-ACETAMINOPHEN 5-325 MG PO TABS
1.0000 | ORAL_TABLET | Freq: Once | ORAL | Status: AC
  Administered 2012-04-27: 1 via ORAL
  Filled 2012-04-27: qty 1

## 2012-04-27 MED ORDER — PENICILLIN V POTASSIUM 250 MG PO TABS
500.0000 mg | ORAL_TABLET | Freq: Once | ORAL | Status: AC
  Administered 2012-04-27: 500 mg via ORAL
  Filled 2012-04-27: qty 2

## 2012-04-27 MED ORDER — IBUPROFEN 800 MG PO TABS
800.0000 mg | ORAL_TABLET | Freq: Once | ORAL | Status: AC
  Administered 2012-04-27: 800 mg via ORAL
  Filled 2012-04-27: qty 1

## 2012-04-27 MED ORDER — PENICILLIN V POTASSIUM 500 MG PO TABS: 500.0000 mg | ORAL_TABLET | Freq: Four times a day (QID) | ORAL | Status: AC
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
## 2012-04-27 NOTE — ED Provider Notes (Signed)
History     CSN: [id number]  Arrival date & time 04/27/12  1141   First MD Initiated Contact with Patient 04/27/12 1316      Chief Complaint  Patient presents with  . Dental Pain    (Consider location/radiation/quality/duration/timing/severity/associated sxs/prior treatment) HPI Comments: Pt has a significantly decayed tooth which broke while eating last PM and has been painful since.  Saw her dentist last week and was referred to an oral surgeon.  He does not accept medicaid.  Patient is a 36 y.o. female presenting with tooth pain. The history is provided by the patient. No language interpreter was used.  Dental PainThe primary symptoms include mouth pain. Primary symptoms do not include fever. The symptoms began yesterday. The symptoms are worsening. The symptoms are new. The symptoms occur constantly.  Additional symptoms do not include: facial swelling.    Past Medical History  Diagnosis Date  . Anxiety   . Depression     Past Surgical History  Procedure Date  . Cholecystectomy   . Tubal ligation   . Tubal ligation     Family History  Problem Relation Age of Onset  . Pneumonia Other     History  Substance Use Topics  . Smoking status: Current Everyday Smoker -- 1.0 packs/day    Types: Cigarettes  . Smokeless tobacco: Not on file  . Alcohol Use: Yes     wine monthly    OB History    Grav Para Term Preterm Abortions TAB SAB Ect Mult Living                  Review of Systems  Constitutional: Negative for fever and chills.  HENT: Negative for facial swelling.   All other systems reviewed and are negative.    Allergies  Review of patient's allergies indicates no known allergies.  Home Medications   Current Outpatient Rx  Name Route Sig Dispense Refill  . ACETAMINOPHEN 325 MG PO TABS Oral Take 1,300 mg by mouth every 6 (six) hours as needed. Pain    . ALPRAZOLAM 1 MG PO TABS Oral Take 1 mg by mouth 4 (four) times daily. scheduled    . IBUPROFEN 200  MG PO TABS Oral Take 800 mg by mouth every 6 (six) hours as needed. Pain    . OLANZAPINE-FLUOXETINE HCL 6-25 MG PO CAPS Oral Take 1 capsule by mouth every evening.     Marland Kitchen HYDROCODONE-ACETAMINOPHEN 5-325 MG PO TABS Oral Take 1 tablet by mouth every 6 (six) hours as needed for pain. 20 tablet 0  . PENICILLIN V POTASSIUM 500 MG PO TABS Oral Take 1 tablet (500 mg total) by mouth 4 (four) times daily. 40 tablet 0    BP 116/75  Pulse 102  Temp 98.2 F (36.8 C)  Resp 18  SpO2 98%  LMP 04/09/2012  Physical Exam  Nursing note and vitals reviewed. Constitutional: She is oriented to person, place, and time. She appears well-developed and well-nourished. No distress.  HENT:  Head: Normocephalic and atraumatic.  Mouth/Throat: Uvula is midline. Normal dentition. Dental caries present. No dental abscesses or uvula swelling.    Eyes: EOM are normal.  Neck: Normal range of motion.  Cardiovascular: Normal rate, regular rhythm and normal heart sounds.   Pulmonary/Chest: Effort normal and breath sounds normal.  Abdominal: Soft. She exhibits no distension. There is no tenderness.  Musculoskeletal: Normal range of motion.  Neurological: She is alert and oriented to person, place, and time.  Skin: Skin is warm  and dry.  Psychiatric: She has a normal mood and affect. Judgment normal.    ED Course  Procedures (including critical care time)  Labs Reviewed - No data to display No results found.   1. Pain, dental       MDM  No visible abscess Ibuprofen rx-hydrocodone, 22 rx-pen VK 500 mg, 40 f/u with dentist ASAP        Evalina Field, PA 04/27/12 1333

## 2012-04-27 NOTE — ED Notes (Signed)
R. Miller, PA at bedside  

## 2012-04-30 NOTE — ED Provider Notes (Signed)
Medical screening examination/treatment/procedure(s) were performed by non-physician practitioner and as supervising physician I was immediately available for consultation/collaboration.  Braysen Cloward, MD 04/30/12 0736 

## 2012-05-14 ENCOUNTER — Encounter (HOSPITAL_COMMUNITY): Payer: Self-pay | Admitting: *Deleted

## 2012-05-14 ENCOUNTER — Emergency Department (HOSPITAL_COMMUNITY)
Admission: EM | Admit: 2012-05-14 | Discharge: 2012-05-14 | Disposition: A | Discharge status: Home / Self Care | Payer: Self-pay | Attending: Emergency Medicine | Admitting: Emergency Medicine

## 2012-05-14 DIAGNOSIS — T23159A Burn of first degree of unspecified palm, initial encounter: Secondary | ICD-10-CM | POA: Insufficient documentation

## 2012-05-14 DIAGNOSIS — X131XXA Other contact with steam and other hot vapors, initial encounter: Secondary | ICD-10-CM | POA: Insufficient documentation

## 2012-05-14 DIAGNOSIS — X12XXXA Contact with other hot fluids, initial encounter: Secondary | ICD-10-CM | POA: Insufficient documentation

## 2012-05-14 DIAGNOSIS — T23239A Burn of second degree of unspecified multiple fingers (nail), not including thumb, initial encounter: Secondary | ICD-10-CM | POA: Insufficient documentation

## 2012-05-14 DIAGNOSIS — Y92009 Unspecified place in unspecified non-institutional (private) residence as the place of occurrence of the external cause: Secondary | ICD-10-CM | POA: Insufficient documentation

## 2012-05-14 DIAGNOSIS — T23209A Burn of second degree of unspecified hand, unspecified site, initial encounter: Secondary | ICD-10-CM

## 2012-05-14 DIAGNOSIS — Z23 Encounter for immunization: Secondary | ICD-10-CM | POA: Insufficient documentation

## 2012-05-14 MED ORDER — HYDROMORPHONE HCL PF 1 MG/ML IJ SOLN
1.0000 mg | Freq: Once | INTRAMUSCULAR | Status: AC
  Administered 2012-05-14: 1 mg via INTRAMUSCULAR
  Filled 2012-05-14: qty 1

## 2012-05-14 MED ORDER — SILVER SULFADIAZINE 1 % EX CREA
TOPICAL_CREAM | Freq: Once | CUTANEOUS | Status: AC
  Administered 2012-05-14: 14:00:00 via TOPICAL
  Filled 2012-05-14: qty 50

## 2012-05-14 MED ORDER — TETANUS-DIPHTH-ACELL PERTUSSIS 5-2.5-18.5 LF-MCG/0.5 IM SUSP
0.5000 mL | Freq: Once | INTRAMUSCULAR | Status: AC
  Administered 2012-05-14: 0.5 mL via INTRAMUSCULAR
  Filled 2012-05-14: qty 0.5

## 2012-05-14 MED ORDER — HYDROCODONE-ACETAMINOPHEN 10-325 MG PO TABS: 1.0000 | ORAL_TABLET | Freq: Four times a day (QID) | ORAL | Status: DC | PRN

## 2012-05-14 MED ORDER — ONDANSETRON 8 MG PO TBDP
8.0000 mg | ORAL_TABLET | Freq: Once | ORAL | Status: AC
  Administered 2012-05-14: 8 mg via ORAL
  Filled 2012-05-14: qty 1

## 2012-05-14 NOTE — ED Notes (Signed)
Patient's hands soaked in sterile water per EDPA's instruction.
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
## 2012-05-15 ENCOUNTER — Encounter (HOSPITAL_COMMUNITY): Payer: Self-pay | Admitting: *Deleted

## 2012-05-15 ENCOUNTER — Emergency Department (HOSPITAL_COMMUNITY)
Admission: EM | Admit: 2012-05-15 | Discharge: 2012-05-15 | Disposition: A | Discharge status: Home / Self Care | Payer: Self-pay | Attending: Emergency Medicine | Admitting: Emergency Medicine

## 2012-05-15 DIAGNOSIS — F17200 Nicotine dependence, unspecified, uncomplicated: Secondary | ICD-10-CM | POA: Insufficient documentation

## 2012-05-15 DIAGNOSIS — R000 Tachycardia, unspecified: Secondary | ICD-10-CM | POA: Insufficient documentation

## 2012-05-15 DIAGNOSIS — F3289 Other specified depressive episodes: Secondary | ICD-10-CM | POA: Insufficient documentation

## 2012-05-15 DIAGNOSIS — Z4800 Encounter for change or removal of nonsurgical wound dressing: Secondary | ICD-10-CM | POA: Insufficient documentation

## 2012-05-15 DIAGNOSIS — M79609 Pain in unspecified limb: Secondary | ICD-10-CM | POA: Insufficient documentation

## 2012-05-15 DIAGNOSIS — F329 Major depressive disorder, single episode, unspecified: Secondary | ICD-10-CM | POA: Insufficient documentation

## 2012-05-15 DIAGNOSIS — T23209A Burn of second degree of unspecified hand, unspecified site, initial encounter: Secondary | ICD-10-CM

## 2012-05-15 DIAGNOSIS — F411 Generalized anxiety disorder: Secondary | ICD-10-CM | POA: Insufficient documentation

## 2012-05-15 MED ORDER — HYDROCODONE-ACETAMINOPHEN 5-325 MG PO TABS: 1.0000 | ORAL_TABLET | ORAL | Status: DC | PRN

## 2012-05-15 MED ORDER — HYDROMORPHONE HCL PF 1 MG/ML IJ SOLN
2.0000 mg | Freq: Once | INTRAMUSCULAR | Status: AC
  Administered 2012-05-15: 2 mg via INTRAMUSCULAR
  Filled 2012-05-15: qty 2
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
## 2012-05-17 ENCOUNTER — Encounter (HOSPITAL_COMMUNITY): Payer: Self-pay | Admitting: *Deleted

## 2012-05-17 ENCOUNTER — Emergency Department (HOSPITAL_COMMUNITY)
Admission: EM | Admit: 2012-05-17 | Discharge: 2012-05-17 | Disposition: A | Discharge status: Home / Self Care | Payer: Self-pay | Attending: Emergency Medicine | Admitting: Emergency Medicine

## 2012-05-17 DIAGNOSIS — F411 Generalized anxiety disorder: Secondary | ICD-10-CM | POA: Insufficient documentation

## 2012-05-17 DIAGNOSIS — Z4801 Encounter for change or removal of surgical wound dressing: Secondary | ICD-10-CM | POA: Insufficient documentation

## 2012-05-17 DIAGNOSIS — T23209A Burn of second degree of unspecified hand, unspecified site, initial encounter: Secondary | ICD-10-CM

## 2012-05-17 DIAGNOSIS — F17200 Nicotine dependence, unspecified, uncomplicated: Secondary | ICD-10-CM | POA: Insufficient documentation

## 2012-05-17 MED ORDER — HYDROMORPHONE HCL PF 1 MG/ML IJ SOLN
2.0000 mg | Freq: Once | INTRAMUSCULAR | Status: AC
  Administered 2012-05-17: 2 mg via INTRAMUSCULAR
  Filled 2012-05-17: qty 2

## 2012-05-17 MED ORDER — OXYCODONE-ACETAMINOPHEN 5-325 MG PO TABS: 1.0000 | ORAL_TABLET | ORAL | Status: AC | PRN

## 2012-05-17 MED ORDER — SILVER SULFADIAZINE 1 % EX CREA
TOPICAL_CREAM | Freq: Once | CUTANEOUS | Status: AC
  Administered 2012-05-17: 18:00:00 via TOPICAL
  Filled 2012-05-17: qty 50
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
## 2012-05-17 NOTE — ED Notes (Signed)
Dressing done by T. Aubery Lapping, RN
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
## 2012-05-18 ENCOUNTER — Ambulatory Visit (HOSPITAL_COMMUNITY): Payer: Self-pay | Admitting: Physical Therapy

## 2012-05-18 NOTE — ED Provider Notes (Signed)
Medical screening examination/treatment/procedure(s) were performed by non-physician practitioner and as supervising physician I was immediately available for consultation/collaboration.  Donterius Filley, MD 05/18/12 1740 

## 2012-05-18 NOTE — ED Provider Notes (Signed)
Medical screening examination/treatment/procedure(s) were performed by non-physician practitioner and as supervising physician I was immediately available for consultation/collaboration.   Joey Hudock L Philippe Gang, MD 05/18/12 0955 
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
## 2012-05-18 NOTE — ED Provider Notes (Signed)
Medical screening examination/treatment/procedure(s) were performed by non-physician practitioner and as supervising physician I was immediately available for consultation/collaboration.   Shelda Jakes, MD 05/18/12 2105

## 2012-05-25 ENCOUNTER — Emergency Department (HOSPITAL_COMMUNITY)
Admission: EM | Admit: 2012-05-25 | Discharge: 2012-05-25 | Disposition: A | Discharge status: Home / Self Care | Payer: Self-pay | Attending: Emergency Medicine | Admitting: Emergency Medicine

## 2012-05-25 ENCOUNTER — Encounter (HOSPITAL_COMMUNITY): Payer: Self-pay | Admitting: *Deleted

## 2012-05-25 DIAGNOSIS — Z5189 Encounter for other specified aftercare: Secondary | ICD-10-CM

## 2012-05-25 DIAGNOSIS — F17200 Nicotine dependence, unspecified, uncomplicated: Secondary | ICD-10-CM | POA: Insufficient documentation

## 2012-05-25 DIAGNOSIS — F411 Generalized anxiety disorder: Secondary | ICD-10-CM | POA: Insufficient documentation

## 2012-05-25 DIAGNOSIS — Z4801 Encounter for change or removal of surgical wound dressing: Secondary | ICD-10-CM | POA: Insufficient documentation

## 2012-05-25 MED ORDER — HYDROMORPHONE HCL PF 1 MG/ML IJ SOLN
INTRAMUSCULAR | Status: AC
  Filled 2012-05-25: qty 1

## 2012-05-25 MED ORDER — NAPROXEN 375 MG PO TABS: 375.0000 mg | ORAL_TABLET | Freq: Two times a day (BID) | ORAL | Status: DC | PRN

## 2012-05-25 MED ORDER — HYDROMORPHONE HCL PF 1 MG/ML IJ SOLN
1.0000 mg | Freq: Once | INTRAMUSCULAR | Status: AC
  Administered 2012-05-25: 1 mg via INTRAMUSCULAR

## 2012-05-25 NOTE — ED Notes (Signed)
Please see Dr. Marylen Ponto assessment, seen prior to RN
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
## 2012-05-28 ENCOUNTER — Inpatient Hospital Stay (HOSPITAL_COMMUNITY): Admission: RE | Admit: 2012-05-28 | Payer: Self-pay | Source: Ambulatory Visit | Admitting: Specialist

## 2012-07-08 ENCOUNTER — Emergency Department (HOSPITAL_COMMUNITY)
Admission: EM | Admit: 2012-07-08 | Discharge: 2012-07-08 | Disposition: A | Discharge status: Home / Self Care | Payer: Self-pay | Attending: Emergency Medicine | Admitting: Emergency Medicine

## 2012-07-08 ENCOUNTER — Encounter (HOSPITAL_COMMUNITY): Payer: Self-pay | Admitting: *Deleted

## 2012-07-08 DIAGNOSIS — Z79899 Other long term (current) drug therapy: Secondary | ICD-10-CM | POA: Insufficient documentation

## 2012-07-08 DIAGNOSIS — K089 Disorder of teeth and supporting structures, unspecified: Secondary | ICD-10-CM | POA: Insufficient documentation

## 2012-07-08 DIAGNOSIS — F17200 Nicotine dependence, unspecified, uncomplicated: Secondary | ICD-10-CM | POA: Insufficient documentation

## 2012-07-08 DIAGNOSIS — K0889 Other specified disorders of teeth and supporting structures: Secondary | ICD-10-CM

## 2012-07-08 DIAGNOSIS — F329 Major depressive disorder, single episode, unspecified: Secondary | ICD-10-CM | POA: Insufficient documentation

## 2012-07-08 DIAGNOSIS — F411 Generalized anxiety disorder: Secondary | ICD-10-CM | POA: Insufficient documentation

## 2012-07-08 DIAGNOSIS — R51 Headache: Secondary | ICD-10-CM | POA: Insufficient documentation

## 2012-07-08 DIAGNOSIS — F3289 Other specified depressive episodes: Secondary | ICD-10-CM | POA: Insufficient documentation

## 2012-07-08 MED ORDER — MELOXICAM 7.5 MG PO TABS: ORAL_TABLET | ORAL | Status: DC

## 2012-07-08 MED ORDER — PROMETHAZINE HCL 12.5 MG PO TABS
12.5000 mg | ORAL_TABLET | Freq: Once | ORAL | Status: AC
  Administered 2012-07-08: 12.5 mg via ORAL
  Filled 2012-07-08: qty 1

## 2012-07-08 MED ORDER — PENICILLIN V POTASSIUM 250 MG PO TABS
500.0000 mg | ORAL_TABLET | Freq: Once | ORAL | Status: AC
  Administered 2012-07-08: 500 mg via ORAL
  Filled 2012-07-08: qty 2

## 2012-07-08 MED ORDER — HYDROCODONE-ACETAMINOPHEN 5-325 MG PO TABS
1.0000 | ORAL_TABLET | ORAL | Status: AC | PRN
Start: 2012-07-08 — End: 2012-07-18

## 2012-07-08 MED ORDER — HYDROCODONE-ACETAMINOPHEN 5-325 MG PO TABS
2.0000 | ORAL_TABLET | Freq: Once | ORAL | Status: AC
  Administered 2012-07-08: 2 via ORAL
  Filled 2012-07-08: qty 2

## 2012-07-08 MED ORDER — AMOXICILLIN 500 MG PO CAPS: 500.0000 mg | ORAL_CAPSULE | Freq: Three times a day (TID) | ORAL | Status: DC

## 2012-07-08 NOTE — ED Provider Notes (Signed)
History     CSN: [id number]  Arrival date & time 07/08/12  1349   First MD Initiated Contact with Patient 07/08/12 1419      Chief Complaint  Patient presents with  . Dental Pain    (Consider location/radiation/quality/duration/timing/severity/associated sxs/prior treatment) Patient is a 36 y.o. female presenting with tooth pain. The history is provided by the patient.  Dental PainThe primary symptoms include mouth pain and headaches. Primary symptoms do not include fever, shortness of breath or cough. The symptoms began more than 1 month ago. The symptoms are worsening. The symptoms are recurrent.  The headache is not associated with photophobia.  Additional symptoms include: dental sensitivity to temperature, gum swelling and jaw pain. Additional symptoms do not include: nosebleeds. Medical issues include: alcohol problem and smoking.    Past Medical History  Diagnosis Date  . Anxiety   . Depression     Past Surgical History  Procedure Date  . Cholecystectomy   . Tubal ligation   . Tubal ligation     Family History  Problem Relation Age of Onset  . Pneumonia Other     History  Substance Use Topics  . Smoking status: Current Every Day Smoker -- 1.0 packs/day    Types: Cigarettes  . Smokeless tobacco: Not on file  . Alcohol Use: Yes     Comment: wine monthly    OB History    Grav Para Term Preterm Abortions TAB SAB Ect Mult Living                  Review of Systems  Constitutional: Negative for fever and activity change.       All ROS Neg except as noted in HPI  HENT: Negative for nosebleeds and neck pain.   Eyes: Negative for photophobia and discharge.  Respiratory: Negative for cough, shortness of breath and wheezing.   Cardiovascular: Negative for chest pain and palpitations.  Gastrointestinal: Negative for abdominal pain and blood in stool.  Genitourinary: Negative for dysuria, frequency and hematuria.  Musculoskeletal: Negative for back pain and  arthralgias.  Skin: Negative.   Neurological: Positive for headaches. Negative for dizziness, seizures and speech difficulty.  Psychiatric/Behavioral: Negative for hallucinations and confusion.    Allergies  Review of patient's allergies indicates no known allergies.  Home Medications   Current Outpatient Rx  Name  Route  Sig  Dispense  Refill  . ALPRAZOLAM 1 MG PO TABS   Oral   Take 1 mg by mouth 4 (four) times daily. scheduled         . CYCLOBENZAPRINE HCL 10 MG PO TABS   Oral   Take 10 mg by mouth 3 (three) times daily.         . IBUPROFEN 200 MG PO TABS   Oral   Take 800 mg by mouth every 6 (six) hours as needed. Pain         . OLANZAPINE-FLUOXETINE HCL 6-25 MG PO CAPS   Oral   Take 1 capsule by mouth every evening.          Marland Kitchen SILVER SULFADIAZINE 1 % EX CREA   Topical   Apply 1 application topically 2 (two) times daily as needed. Burn on fingers.           BP 124/97  Pulse 108  Temp 98.5 F (36.9 C)  Resp 20  SpO2 100%  LMP 07/01/2012  Physical Exam  Nursing note and vitals reviewed. Constitutional: She is oriented to person, place, and  time. She appears well-developed and well-nourished.  Non-toxic appearance.  HENT:  Head: Normocephalic.  Right Ear: Tympanic membrane and external ear normal.  Left Ear: Tympanic membrane and external ear normal.       Right upper second molar is decayed to the gum line. Cavity of the right lower premolar. No visible abscess.  Eyes: EOM and lids are normal. Pupils are equal, round, and reactive to light.  Neck: Normal range of motion. Neck supple. Carotid bruit is not present.  Cardiovascular: Normal rate, regular rhythm, normal heart sounds, intact distal pulses and normal pulses.   Pulmonary/Chest: Breath sounds normal. No respiratory distress.  Abdominal: Soft. Bowel sounds are normal. There is no tenderness. There is no guarding.  Musculoskeletal: Normal range of motion.  Lymphadenopathy:       Head (right  side): No submandibular adenopathy present.       Head (left side): No submandibular adenopathy present.    She has no cervical adenopathy.  Neurological: She is alert and oriented to person, place, and time. She has normal strength. No cranial nerve deficit or sensory deficit.  Skin: Skin is warm and dry.  Psychiatric: She has a normal mood and affect. Her speech is normal.    ED Course  Procedures (including critical care time)  Labs Reviewed - No data to display No results found.   No diagnosis found.    MDM  I have reviewed nursing notes, vital signs, and all appropriate lab and imaging results for this patient. Pt to see a dentist next week. She has a decaying tooth that is crumbling and causing pain. She called dentist today but could not get through.  Rx for amoxil, norco, and mobic given to the patient.       Kathie Dike, [place] 07/08/12 1434

## 2012-07-08 NOTE — ED Provider Notes (Signed)
Medical screening examination/treatment/procedure(s) were performed by non-physician practitioner and as supervising physician I was immediately available for consultation/collaboration.   Joya Gaskins, MD 07/08/12 [phone]

## 2012-07-08 NOTE — ED Notes (Addendum)
Pt c/o upper dental pain from broken tooth, is suppose to have tooth "cut out" 08/09/2012 but that pain has become worse 3 days ago states that she has "pus" coming from it,

## 2012-07-21 IMAGING — CT CT ABD-PELV W/ CM
2 of 4 series · 17 of 46 positions shown, 19 images · IV contrast (APPLIED)
Comparison: None.

CLINICAL DATA: Diffuse abdominal pain, nausea vomiting.

CT ABDOMEN AND PELVIS WITH CONTRAST
TECHNIQUE: Multidetector CT imaging of the abdomen and pelvis was
performed following the standard protocol during bolus
administration of intravenous contrast.
Contrast: 125 ml 6mnipaque-0WW intravenous contrast.

[Series 2: abd_pel 5.0 b40f st · axial · 0.71mm/px · z∈[-510,-90]mm · 14 of 92 slices shown, 16 images]
[im 4/92  soft-tissue]
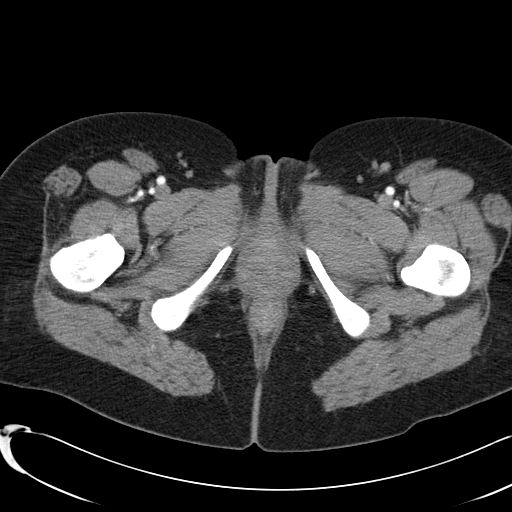
[im 4/92  bone]
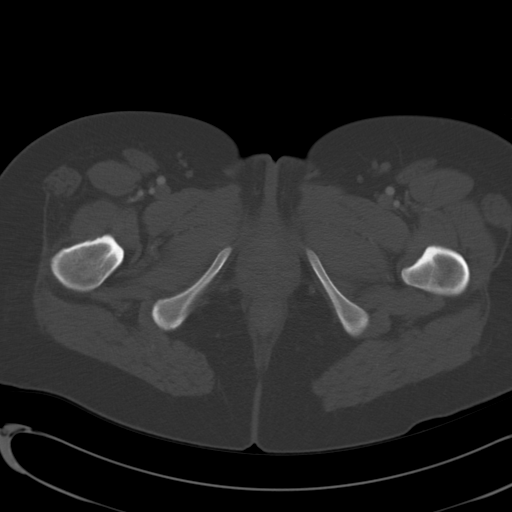
[im 11/92  soft-tissue]
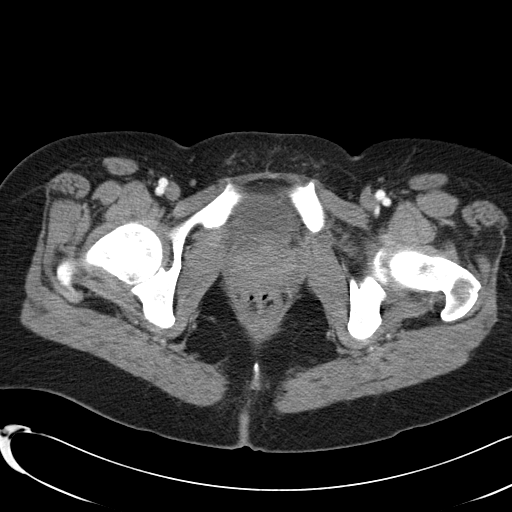
[im 19/92  soft-tissue]
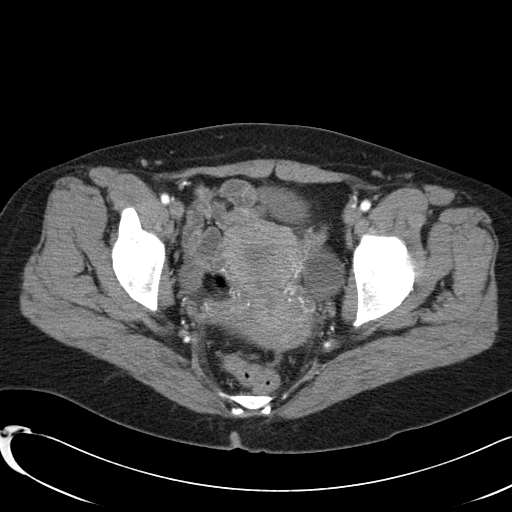
[im 26/92  soft-tissue]
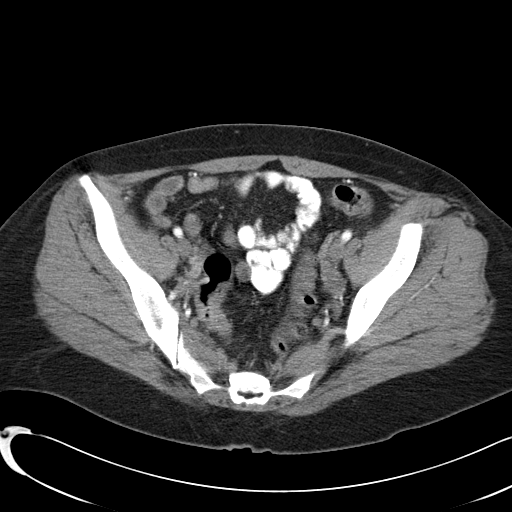
[im 30/92  soft-tissue]
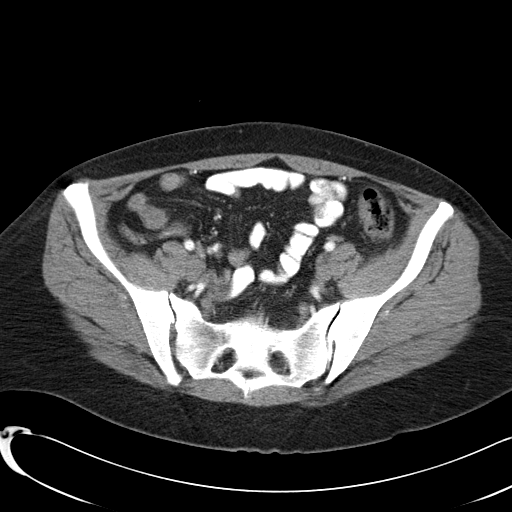
[im 37/92  soft-tissue]
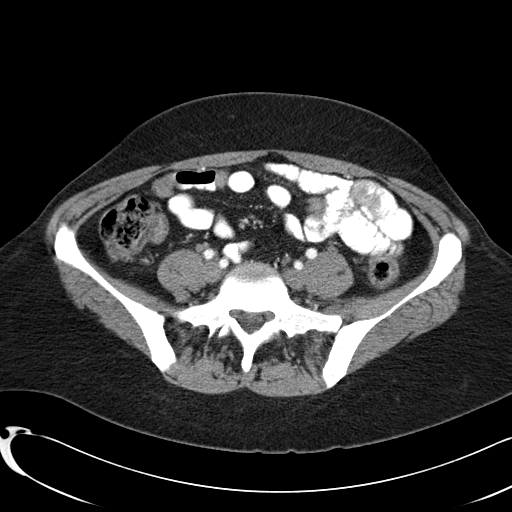
[im 44/92  soft-tissue]
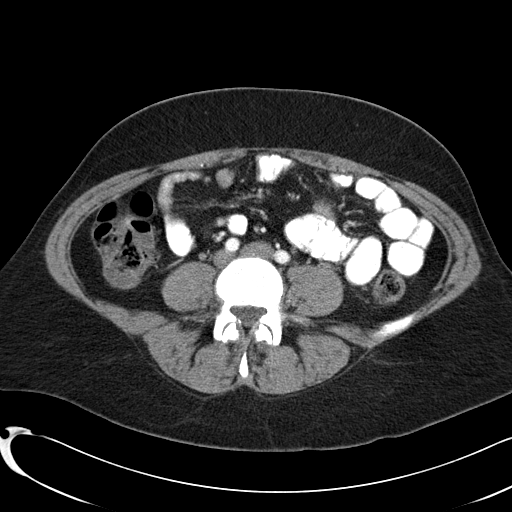
[im 48/92  soft-tissue]
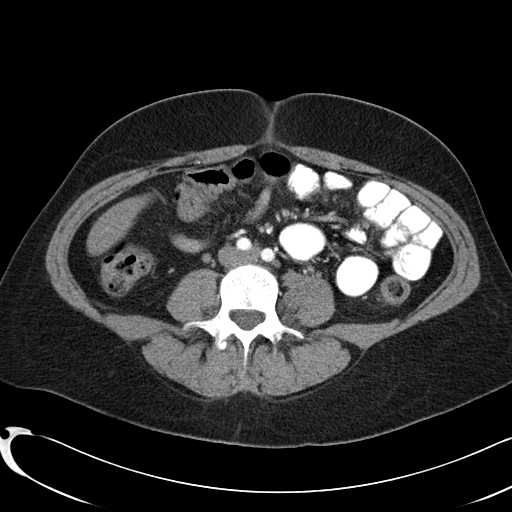
[im 55/92  soft-tissue]
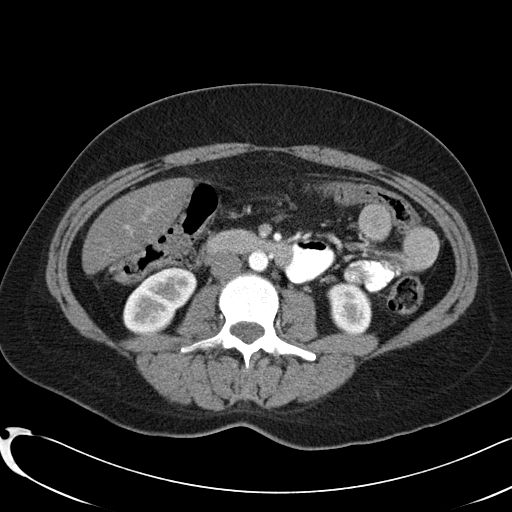
[im 55/92  bone]
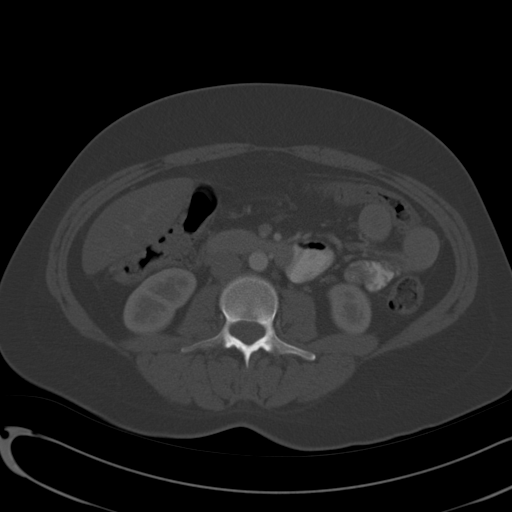
[im 62/92  soft-tissue]
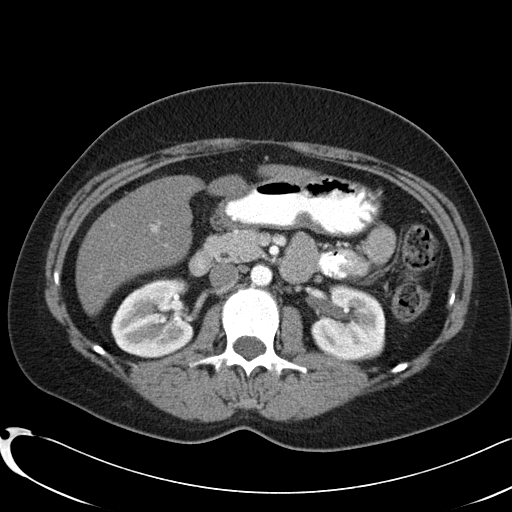
[im 70/92  soft-tissue]
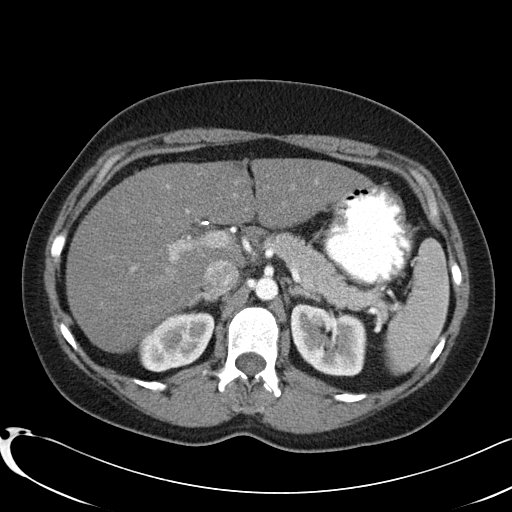
[im 73/92  soft-tissue]
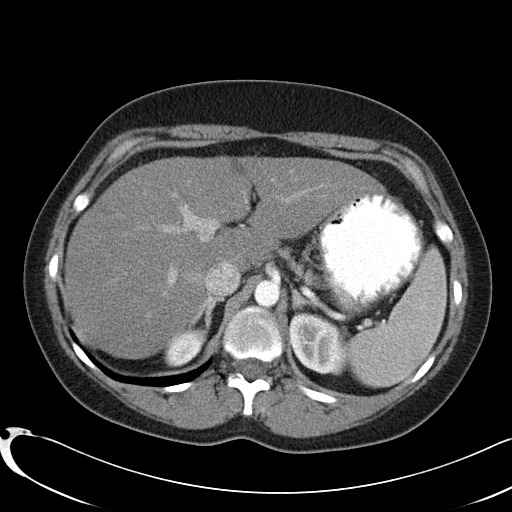
[im 81/92  soft-tissue]
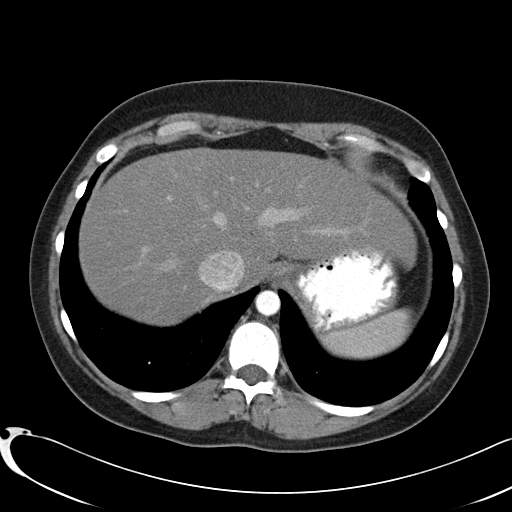
[im 88/92  soft-tissue]
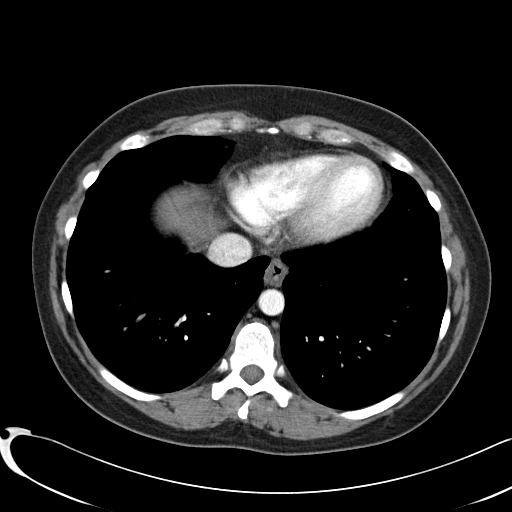

[Series 602: coronal abdomen · coronal · 0.93mm/px · 3 of 121 slices shown]
[im 41/121  soft-tissue]
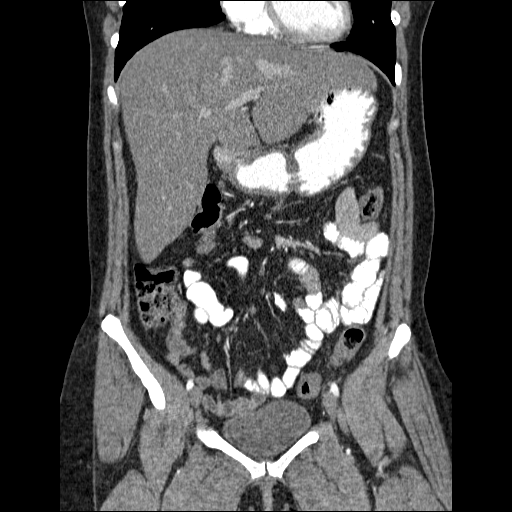
[im 54/121  soft-tissue]
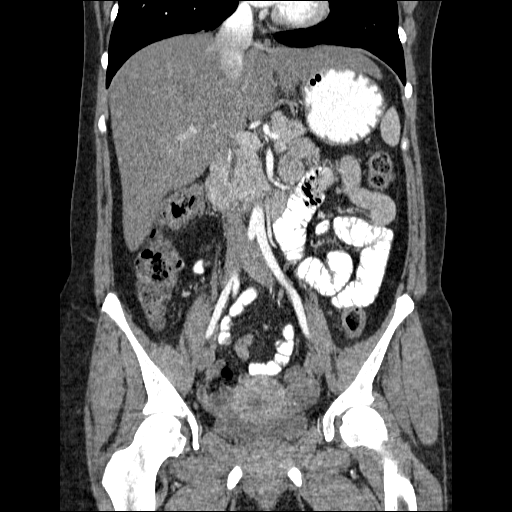
[im 67/121  soft-tissue]
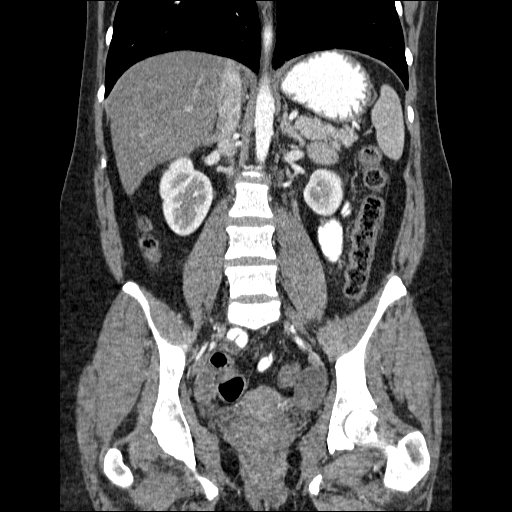

[17 of 46 positions shown; findings below may reference images not displayed]

FINDINGS: Lung bases are clear.  Heart size is normal.  No pleural
or pericardial effusion.

Diffuse low attenuation of the liver is in keeping with fatty
infiltration. Focal low attenuation adjacent to the falciform
ligament is also in keeping with focal fat.  Status post
cholecystectomy.  No biliary ductal dilatation.  Unremarkable
pancreas and spleen.  Unremarkable adrenal glands.  Symmetric renal
enhancement.  No hydronephrosis.

Small bowel loops are normal course and caliber.  The appendix is
normal.

No mesenteric or retroperitoneal lymphadenopathy.  No free
intraperitoneal air or fluid.  The aorta is normal in course and
caliber.

Thin-walled bladder.  Bilateral ovarian cysts, measuring up to
cm on the left. In a premenopausal patient, likely physiologic.  No
free fluid within the pelvis.

No aggressive osseous lesions.
IMPRESSION: No acute CT abnormality to explain the patient's abdominal pain.

Hepatic steatosis.

## 2012-07-22 IMAGING — CR DG ABDOMEN ACUTE W/ 1V CHEST
3 series · 3 of 3 positions shown · non-contrast
Comparison: CT abdomen and pelvis 02/22/2011.

CLINICAL DATA: Nausea and vomiting.

ACUTE ABDOMEN SERIES (ABDOMEN 2 VIEW & CHEST 1 VIEW)

[w chest pa]
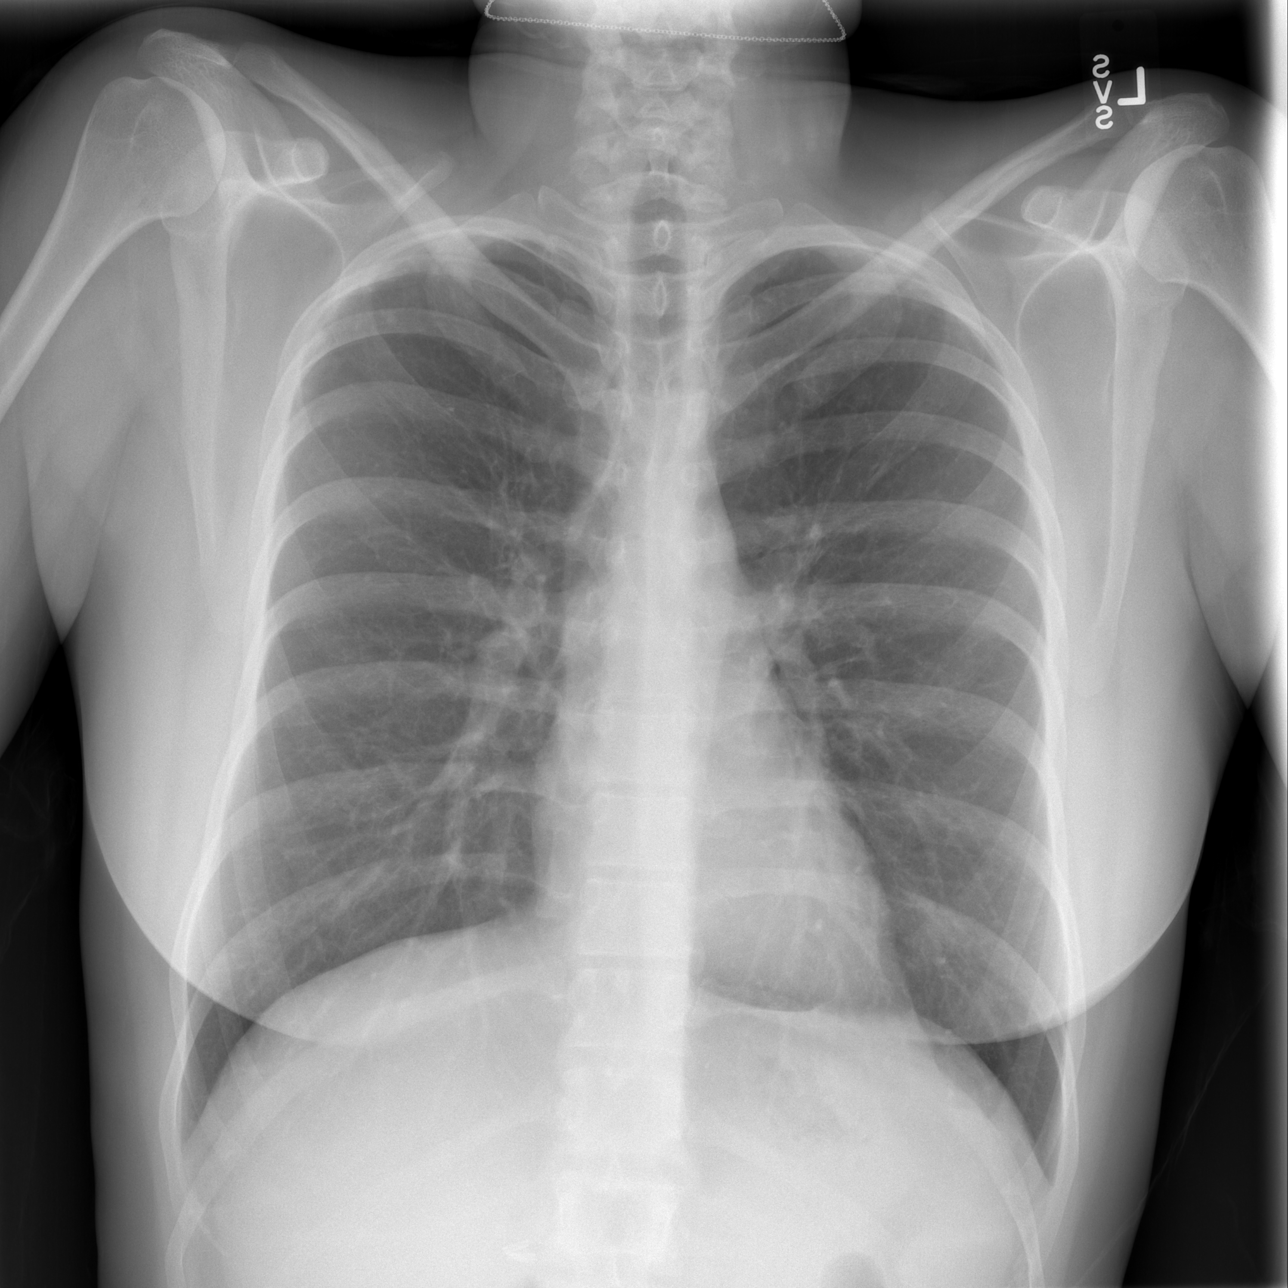

[w abdomen upright *]
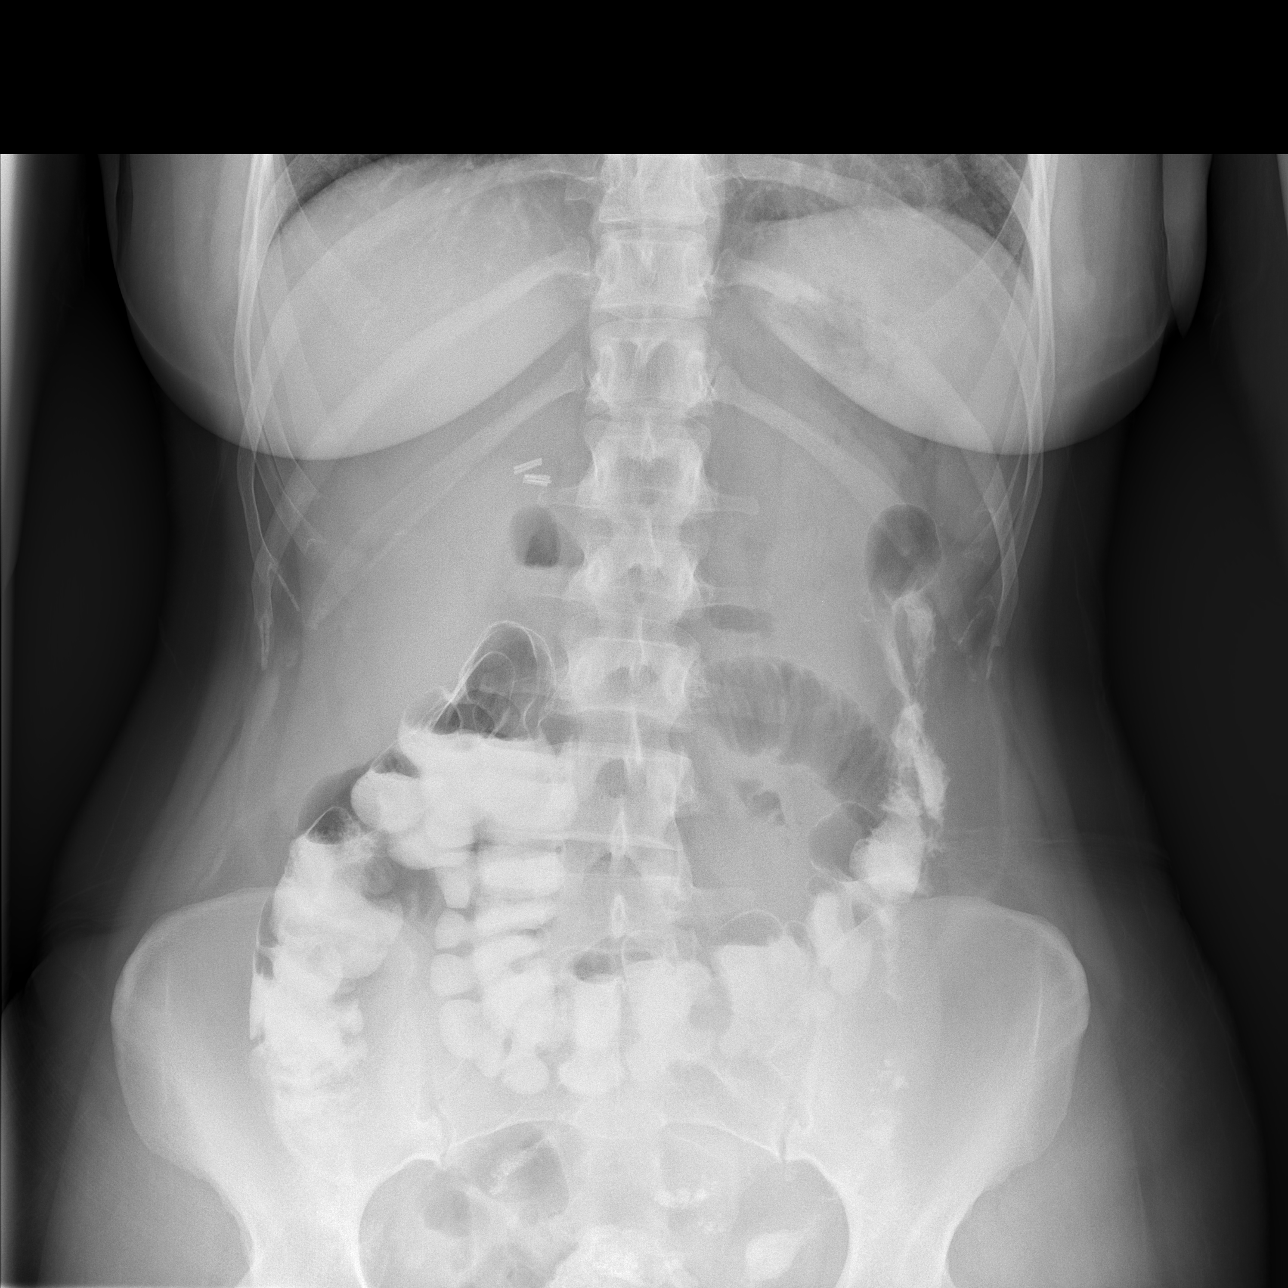

[t abdomen supine]
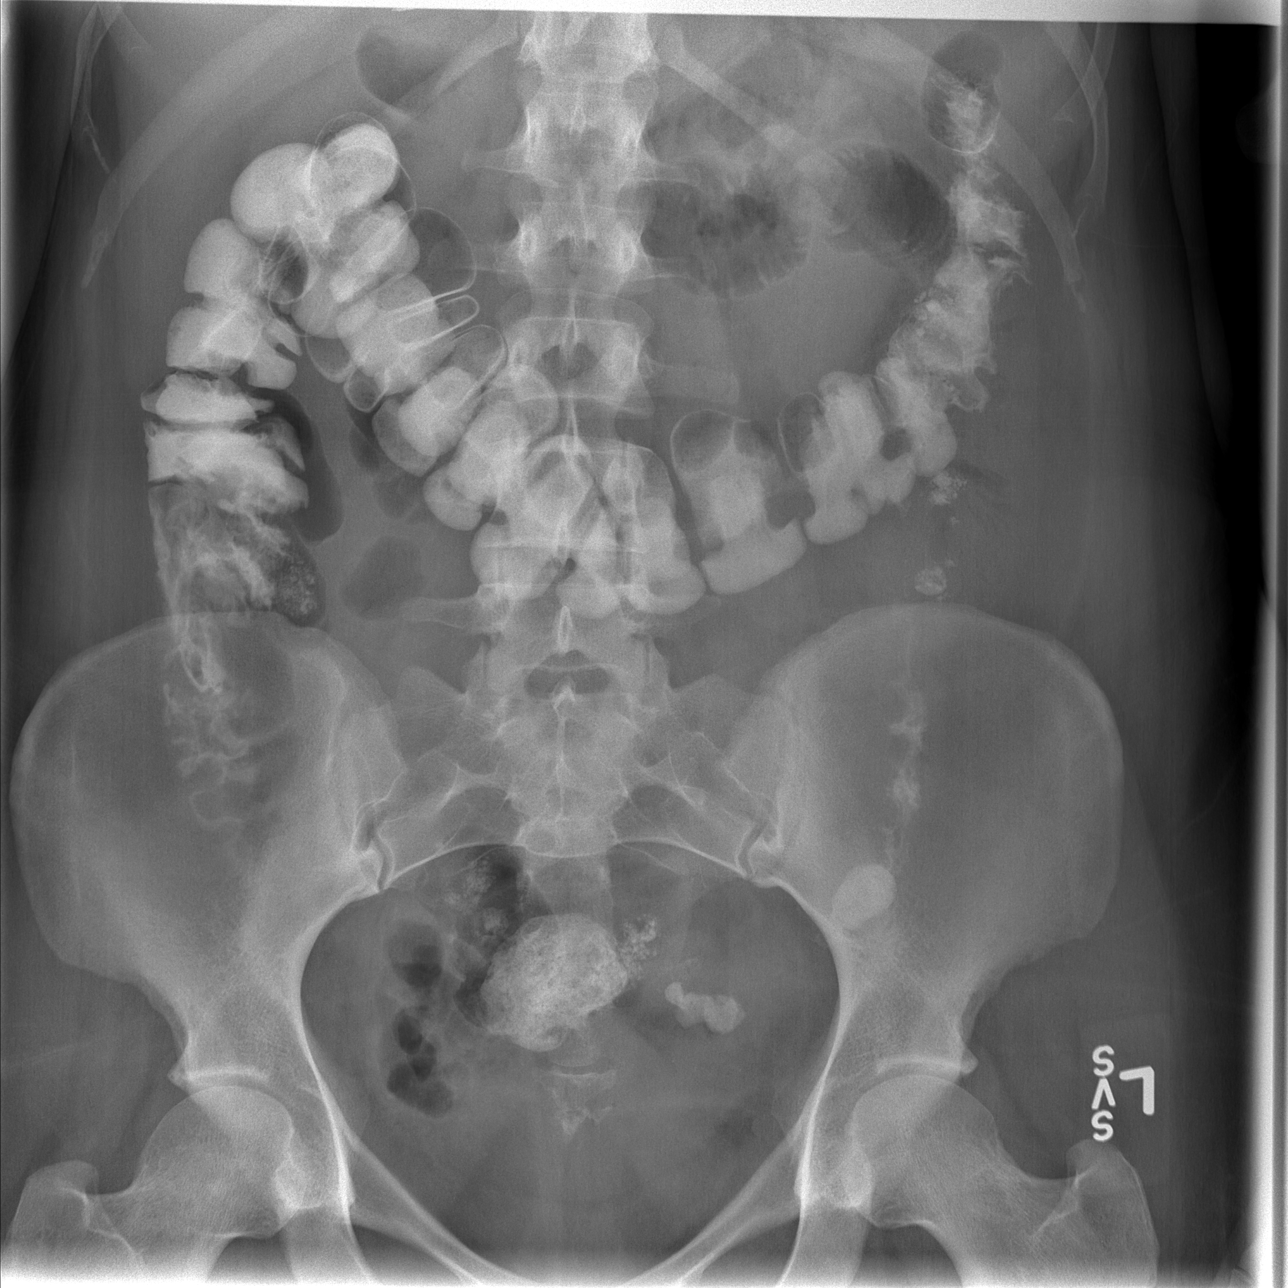

[3 of 3 positions shown; findings below may reference images not displayed]

FINDINGS: Single view of the chest demonstrates clear lungs and
normal heart size.  No pneumothorax or pleural effusion.

Two views of the abdomen show no free intraperitoneal air.
Contrast material is present throughout the colon from the
patient's CT scan.  Single loop of prominent small bowel is seen in
the left upper quadrant measuring 3.6 cm in diameter.
IMPRESSION: 1.  No acute cardiopulmonary disease.
2.  Single gas filled and mildly prominent loop of small bowel in
the left upper quadrant is nonspecific and could be due to focal
inflammatory process.  Small bowel appeared normal on CT scan
yesterday.

## 2012-09-24 ENCOUNTER — Emergency Department (HOSPITAL_COMMUNITY)
Admission: EM | Admit: 2012-09-24 | Discharge: 2012-09-24 | Disposition: A | Discharge status: Home / Self Care | Payer: Self-pay | Attending: Emergency Medicine | Admitting: Emergency Medicine

## 2012-09-24 ENCOUNTER — Encounter (HOSPITAL_COMMUNITY): Payer: Self-pay | Admitting: *Deleted

## 2012-09-24 DIAGNOSIS — Y939 Activity, unspecified: Secondary | ICD-10-CM | POA: Insufficient documentation

## 2012-09-24 DIAGNOSIS — Z79899 Other long term (current) drug therapy: Secondary | ICD-10-CM | POA: Insufficient documentation

## 2012-09-24 DIAGNOSIS — IMO0002 Reserved for concepts with insufficient information to code with codable children: Secondary | ICD-10-CM | POA: Insufficient documentation

## 2012-09-24 DIAGNOSIS — F3289 Other specified depressive episodes: Secondary | ICD-10-CM | POA: Insufficient documentation

## 2012-09-24 DIAGNOSIS — F329 Major depressive disorder, single episode, unspecified: Secondary | ICD-10-CM | POA: Insufficient documentation

## 2012-09-24 DIAGNOSIS — T63391A Toxic effect of venom of other spider, accidental (unintentional), initial encounter: Secondary | ICD-10-CM | POA: Insufficient documentation

## 2012-09-24 DIAGNOSIS — F909 Attention-deficit hyperactivity disorder, unspecified type: Secondary | ICD-10-CM | POA: Insufficient documentation

## 2012-09-24 DIAGNOSIS — F411 Generalized anxiety disorder: Secondary | ICD-10-CM | POA: Insufficient documentation

## 2012-09-24 DIAGNOSIS — L089 Local infection of the skin and subcutaneous tissue, unspecified: Secondary | ICD-10-CM

## 2012-09-24 DIAGNOSIS — Y92009 Unspecified place in unspecified non-institutional (private) residence as the place of occurrence of the external cause: Secondary | ICD-10-CM | POA: Insufficient documentation

## 2012-09-24 HISTORY — DX: Attention-deficit hyperactivity disorder, unspecified type: F90.9

## 2012-09-24 MED ORDER — KETOROLAC TROMETHAMINE 60 MG/2ML IM SOLN
INTRAMUSCULAR | Status: AC
  Administered 2012-09-24: 60 mg via INTRAMUSCULAR
  Filled 2012-09-24: qty 2

## 2012-09-24 MED ORDER — HYDROMORPHONE HCL PF 1 MG/ML IJ SOLN
1.0000 mg | Freq: Once | INTRAMUSCULAR | Status: AC
  Administered 2012-09-24: 1 mg via INTRAMUSCULAR
  Filled 2012-09-24: qty 1

## 2012-09-24 MED ORDER — OXYCODONE-ACETAMINOPHEN 5-325 MG PO TABS
2.0000 | ORAL_TABLET | Freq: Once | ORAL | Status: AC
  Administered 2012-09-24: 2 via ORAL
  Filled 2012-09-24: qty 2

## 2012-09-24 MED ORDER — CEPHALEXIN 500 MG PO CAPS: 500.0000 mg | ORAL_CAPSULE | Freq: Four times a day (QID) | ORAL | Status: DC

## 2012-09-24 MED ORDER — KETOROLAC TROMETHAMINE 60 MG/2ML IM SOLN
60.0000 mg | Freq: Once | INTRAMUSCULAR | Status: AC
  Administered 2012-09-24: 60 mg via INTRAMUSCULAR

## 2012-09-24 MED ORDER — SULFAMETHOXAZOLE-TRIMETHOPRIM 800-160 MG PO TABS: 1.0000 | ORAL_TABLET | Freq: Two times a day (BID) | ORAL | Status: DC

## 2012-09-24 MED ORDER — VANCOMYCIN HCL IN DEXTROSE 1-5 GM/200ML-% IV SOLN
1000.0000 mg | Freq: Once | INTRAVENOUS | Status: AC
  Administered 2012-09-24: 1000 mg via INTRAVENOUS
  Filled 2012-09-24: qty 200

## 2012-09-24 MED ORDER — DEXTROSE 5 % IV SOLN
1.0000 g | INTRAVENOUS | Status: DC
  Administered 2012-09-24: 1 g via INTRAVENOUS
  Filled 2012-09-24: qty 10

## 2012-09-24 MED ORDER — OXYCODONE-ACETAMINOPHEN 5-325 MG PO TABS: 2.0000 | ORAL_TABLET | ORAL | Status: DC | PRN

## 2012-09-24 NOTE — ED Notes (Signed)
Area of swelling to hand marked with marker, pt advised to come back in 12 hours for recheck per discharge instructions.

## 2012-09-24 NOTE — ED Provider Notes (Signed)
Medical screening examination/treatment/procedure(s) were performed by non-physician practitioner and as supervising physician I was immediately available for consultation/collaboration.   Clydene Burack L Jakub Debold, MD 09/24/12 2311 

## 2012-09-24 NOTE — ED Provider Notes (Signed)
History     CSN: [id number]  Arrival date & time 09/24/12  1634   First MD Initiated Contact with Patient 09/24/12 1656      Chief Complaint  Patient presents with  . Insect Bite    (Consider location/radiation/quality/duration/timing/severity/associated sxs/prior treatment) Patient is a 37 y.o. female presenting with hand injury. The history is provided by the patient. No language interpreter was used.  Hand Injury  The incident occurred 12 to 24 hours ago. The incident occurred at home. There was no injury mechanism. The pain is present in the left hand. The quality of the pain is described as aching. The pain is at a severity of 6/10. The pain is moderate. The pain has been constant since the incident. Pertinent negatives include no fever. She reports no foreign bodies present. The symptoms are aggravated by movement. She has tried nothing for the symptoms. The treatment provided no relief.  Pt reports she had a brown recluse bite her on her hand.   Pt reports spider was in her bed.  Pt reports she saw Dr. Bascom Levels and he wanted her to go to [facility].   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 Anxiety   . Depression   . ADHD (attention deficit hyperactivity disorder)     Past Surgical History  Procedure Date  . Cholecystectomy   . Tubal ligation   . Tubal ligation     Family History  Problem Relation Age of Onset  . Pneumonia Other     History  Substance Use Topics  . Smoking status: Current Every Day Smoker -- 1.0 packs/day    Types: Cigarettes  . Smokeless tobacco: Not on file  . Alcohol Use: Yes     Comment: wine monthly    OB History    Grav Para Term Preterm Abortions TAB SAB Ect Mult Living                  Review of Systems  Constitutional: Negative for fever.  All other systems reviewed and are negative.    Allergies  Review of patient's allergies indicates no known allergies.  Home Medications    Current Outpatient Rx  Name  Route  Sig  Dispense  Refill  . ALPRAZOLAM 1 MG PO TABS   Oral   Take 1 mg by mouth 4 (four) times daily. scheduled         . AMPHETAMINE-DEXTROAMPHETAMINE 20 MG PO TABS   Oral   Take 20 mg by mouth 4 (four) times daily.         . CYCLOBENZAPRINE HCL 10 MG PO TABS   Oral   Take 10 mg by mouth 3 (three) times daily.         . IBUPROFEN 200 MG PO TABS   Oral   Take 800 mg by mouth every 6 (six) hours as needed. Pain         . OLANZAPINE-FLUOXETINE HCL 6-25 MG PO CAPS   Oral   Take 1 capsule by mouth every evening.            BP 137/76  Pulse 88  Temp 98.3 F (36.8 C) (Oral)  Resp 20  Ht 6' (1.829 m)  Wt 240 lb (108.863 kg)  BMI 32.55 kg/m2  SpO2 100%  LMP 09/04/2012  Physical Exam  Nursing note and vitals reviewed. Constitutional: She is oriented to person, place, and time. She appears  well-developed and well-nourished.  HENT:  Head: Normocephalic and atraumatic.  Cardiovascular: Normal rate.   Pulmonary/Chest: Effort normal.  Musculoskeletal: She exhibits edema and tenderness.  Neurological: She is alert and oriented to person, place, and time. She has normal reflexes.  Skin: There is erythema.  Psychiatric: She has a normal mood and affect.    ED Course  Procedures (including critical care time)  Labs Reviewed - No data to display No results found.   No diagnosis found.    MDM  Pt given rx for percocet and bactrim,   Pt advised to return here tomorrow for 12 hour recheck.   I suspect infection, probably mrsa,   I doubt spider bite        Elson Areas, PA 09/24/12 1918  Lonia Skinner [place], [place] 09/24/12 1919

## 2012-09-24 NOTE — ED Notes (Signed)
Spider bite to  Lt hand ,swelling and redness

## 2012-09-27 ENCOUNTER — Encounter (HOSPITAL_COMMUNITY): Payer: Self-pay | Admitting: *Deleted

## 2012-09-27 ENCOUNTER — Emergency Department (HOSPITAL_COMMUNITY)
Admission: EM | Admit: 2012-09-27 | Discharge: 2012-09-27 | Disposition: A | Discharge status: Home / Self Care | Payer: Self-pay | Attending: Emergency Medicine | Admitting: Emergency Medicine

## 2012-09-27 DIAGNOSIS — Z79899 Other long term (current) drug therapy: Secondary | ICD-10-CM | POA: Insufficient documentation

## 2012-09-27 DIAGNOSIS — Z765 Malingerer [conscious simulation]: Secondary | ICD-10-CM | POA: Insufficient documentation

## 2012-09-27 DIAGNOSIS — F909 Attention-deficit hyperactivity disorder, unspecified type: Secondary | ICD-10-CM | POA: Insufficient documentation

## 2012-09-27 DIAGNOSIS — F3289 Other specified depressive episodes: Secondary | ICD-10-CM | POA: Insufficient documentation

## 2012-09-27 DIAGNOSIS — F329 Major depressive disorder, single episode, unspecified: Secondary | ICD-10-CM | POA: Insufficient documentation

## 2012-09-27 DIAGNOSIS — F17200 Nicotine dependence, unspecified, uncomplicated: Secondary | ICD-10-CM | POA: Insufficient documentation

## 2012-09-27 DIAGNOSIS — L02519 Cutaneous abscess of unspecified hand: Secondary | ICD-10-CM | POA: Insufficient documentation

## 2012-09-27 DIAGNOSIS — R509 Fever, unspecified: Secondary | ICD-10-CM | POA: Insufficient documentation

## 2012-09-27 DIAGNOSIS — F411 Generalized anxiety disorder: Secondary | ICD-10-CM | POA: Insufficient documentation

## 2012-09-27 DIAGNOSIS — L03114 Cellulitis of left upper limb: Secondary | ICD-10-CM

## 2012-09-27 NOTE — ED Notes (Signed)
Note: Patient has thick- speech, and gait is slightly unsteady.  Observed by patient advocate to stumble while in room and somewhat staggered gait upon discharge.  Family member with her unsteady gait, and also thick speech

## 2012-09-27 NOTE — ED Provider Notes (Signed)
History    This chart was scribed for Ward Givens, MD by Charolett Bumpers, ED Scribe. The patient was seen in room APA09/APA09. Patient's care was started at 2051.   CSN: [id number]  Arrival date & time 09/27/12  2011   First MD Initiated Contact with Patient 09/27/12 2051      Chief Complaint  Patient presents with  . Hand Pain    The history is provided by the patient. No language interpreter was used.   Amanda Carrillo is a 37 y.o. female who presents to the Emergency Department complaining of gradually improving, moderate left hand swelling. She states that the hand swelling started 5 days ago. She states that she was seen 2 days ago for hand swelling. She states the swelling was marked at that time, and the current swelling is still without the borders. She reports associated pain with bending her fingers. She had a 101 fever last night, temperature here in ED is 98. She is currently on Septra DS that was prescribed 2/7 and an additional "green pill"that appears to be keflex but states she cannot remember what it is called. She is also out of her Percocet. 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   She also states that she needs her Xanax refilled and only has one Xanax left. Her xanax was last filled on 09/19/12 according to bottle with 4 refills until July.  She had She states that her pharmacy cannot fill her prescriptions due to an investigation into her PCP's license. She reports to the nurse that she has the rest of her prescription of Xanax in a monthly dispenser at home.   PCP: Dr. Bascom Levels  Past Medical History  Diagnosis Date  . Anxiety   . Depression   . ADHD (attention deficit hyperactivity disorder)     Past Surgical History  Procedure Laterality Date  . Cholecystectomy    . Tubal ligation    . Tubal ligation      Family History  Problem Relation Age of Onset  . Pneumonia Other      History  Substance Use Topics  . Smoking status: Current Every Day Smoker -- 1.00 packs/day    Types: Cigarettes  . Smokeless tobacco: Not on file  . Alcohol Use: Yes     Comment: wine monthly  She denies tobacco use Occasional alcohol use She denies any street drug use  OB History   Grav Para Term Preterm Abortions TAB SAB Ect Mult Living                  Review of Systems  Constitutional: Positive for fever. Negative for chills.  Musculoskeletal: Positive for joint swelling and arthralgias.  All other systems reviewed and are negative.    Allergies  Review of patient's allergies indicates no known allergies.  Home Medications   Current Outpatient Rx  Name  Route  Sig  Dispense  Refill  . ALPRAZolam (XANAX) 1 MG tablet   Oral   Take 1 mg by mouth 4 (four) times daily. scheduled         . amphetamine-dextroamphetamine (ADDERALL) 20 MG tablet   Oral   Take 20 mg by mouth 4 (four) times daily.         . cephALEXin (KEFLEX) 500 MG capsule   Oral   Take 1 capsule (500 mg total) by mouth 4 (four) times daily.  40 capsule   0   . cyclobenzaprine (FLEXERIL) 10 MG tablet   Oral   Take 10 mg by mouth 3 (three) times daily.         Marland Kitchen ibuprofen (ADVIL,MOTRIN) 200 MG tablet   Oral   Take 800 mg by mouth every 6 (six) hours as needed. Pain         . olanzapine-FLUoxetine (SYMBYAX) 6-25 MG per capsule   Oral   Take 1 capsule by mouth every evening.          Marland Kitchen oxyCODONE-acetaminophen (PERCOCET/ROXICET) 5-325 MG per tablet   Oral   Take 2 tablets by mouth every 4 (four) hours as needed for pain.   10 tablet   0   . sulfamethoxazole-trimethoprim (SEPTRA DS) 800-160 MG per tablet   Oral   Take 1 tablet by mouth every 12 (twelve) hours.   20 tablet   0     Triage Vitals: BP 146/105  Pulse 105  Temp(Src) 98 F (36.7 C) (Oral)  Resp 20  Ht 5\' 9"  (1.753 m)  Wt 240 lb (108.863 kg)  BMI 35.43 kg/m2  SpO2 100%  LMP 08/27/2012  Vital signs  normal except tachycardia   Physical Exam  Nursing note and vitals reviewed. Constitutional: She is oriented to person, place, and time. She appears well-developed and well-nourished. No distress.  Speech slow and slightly slurred  HENT:  Head: Normocephalic and atraumatic.  Eyes: EOM are normal. Pupils are equal, round, and reactive to light.  Neck: Normal range of motion. Neck supple. No tracheal deviation present.  Pulmonary/Chest: Effort normal. No respiratory distress.  Abdominal: She exhibits no distension.  Musculoskeletal: Normal range of motion. She exhibits no edema.  Neurological: She is alert and oriented to person, place, and time.  Skin: Skin is warm and dry.  Mild swelling to the dorsum of the left hand that is well below the ink mark placed with the last ED visit. Mild redness noted, but no warmth. No localized induration that would suggest abscess. 3 small puncture-type scabs on dorsum of hand, but denies IV drug use. States she has pain when she makes a fist but she is able to do it.  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   Psychiatric: She has a normal mood and affect. Her behavior is normal.    ED Course  Procedures (including critical care time)  DIAGNOSTIC STUDIES: Oxygen Saturation is 100% on room air, normal by my interpretation.    COORDINATION OF CARE:  21:15-Discussed planned course of treatment with the patient including continuing with current antibiotics and d/c home, who is agreeable at this time.   Pt not given any IV antibiotics tonight, her hand is improving and she was seen on the 7th so it doesn't make sense to given IV antibiotics tonight.  She was also given percocet #10 tabs on that date  Pt wants narcotics and xanax, she told her female nurse she has a monthly pill dispenser at home and has xanax, she told me she only has one pill left and gave the nurse  her pill bottle and has two pills in her bottle now. Her bottle has 4 refills on it until July 2014. Pt told to go to Mental health to be evaluated for her depression and her anxiety. I am not going to give her any narcotics for pain. Pt was observed to  be using her left hand normally and picking up her drink with her left hand without problems or pain.    1. Cellulitis of left hand   2. Drug-seeking behavior    Plan discharge  Devoria Albe, MD, FACEP    MDM   I personally performed the services described in this documentation, which was scribed in my presence. The recorded information has been reviewed and considered.  Devoria Albe, MD, [facility]     Ward Givens, MD 09/27/12 [phone]

## 2012-09-27 NOTE — ED Notes (Signed)
States she needs help, stressed, "about to go out of my mind"  Cant see a psychiatrist because she does not have money to pay up front - requesting a referral to a free faith based counseling service - has their business card.   MD in to evaluate

## 2012-09-27 NOTE — ED Notes (Signed)
Lt hand swollen , had IV antibiotic.. Out of pain meds. Here for recheck.  Says she is out of her anxiety meds for over 1 week and wants help with that.

## 2012-10-15 ENCOUNTER — Emergency Department (HOSPITAL_COMMUNITY)
Admission: EM | Admit: 2012-10-15 | Discharge: 2012-10-15 | Disposition: A | Discharge status: Home / Self Care | Payer: Self-pay | Attending: Emergency Medicine | Admitting: Emergency Medicine

## 2012-10-15 ENCOUNTER — Encounter (HOSPITAL_COMMUNITY): Payer: Self-pay | Admitting: *Deleted

## 2012-10-15 DIAGNOSIS — F3289 Other specified depressive episodes: Secondary | ICD-10-CM | POA: Insufficient documentation

## 2012-10-15 DIAGNOSIS — Z79899 Other long term (current) drug therapy: Secondary | ICD-10-CM | POA: Insufficient documentation

## 2012-10-15 DIAGNOSIS — Z760 Encounter for issue of repeat prescription: Secondary | ICD-10-CM | POA: Insufficient documentation

## 2012-10-15 DIAGNOSIS — F17200 Nicotine dependence, unspecified, uncomplicated: Secondary | ICD-10-CM | POA: Insufficient documentation

## 2012-10-15 DIAGNOSIS — F411 Generalized anxiety disorder: Secondary | ICD-10-CM | POA: Insufficient documentation

## 2012-10-15 DIAGNOSIS — F329 Major depressive disorder, single episode, unspecified: Secondary | ICD-10-CM | POA: Insufficient documentation

## 2012-10-15 DIAGNOSIS — F909 Attention-deficit hyperactivity disorder, unspecified type: Secondary | ICD-10-CM | POA: Insufficient documentation

## 2012-10-15 MED ORDER — AMPHETAMINE-DEXTROAMPHETAMINE 20 MG PO TABS: 20.0000 mg | ORAL_TABLET | Freq: Four times a day (QID) | ORAL | Status: DC

## 2012-10-15 MED ORDER — OXYCODONE-ACETAMINOPHEN 5-325 MG PO TABS: 1.0000 | ORAL_TABLET | Freq: Four times a day (QID) | ORAL | Status: DC | PRN

## 2012-10-15 MED ORDER — TRAMADOL HCL 50 MG PO TABS: 50.0000 mg | ORAL_TABLET | Freq: Four times a day (QID) | ORAL | Status: DC | PRN

## 2012-10-15 MED ORDER — ALPRAZOLAM 1 MG PO TABS: 1.0000 mg | ORAL_TABLET | Freq: Four times a day (QID) | ORAL | Status: DC | PRN

## 2012-10-15 NOTE — ED Notes (Addendum)
Pt says she could not get her med filled, that her doctor is no longer in practice. Pt tearful,says she feels she is having a panic attack.

## 2012-10-15 NOTE — ED Provider Notes (Signed)
History     CSN: [id number]  Arrival date & time 10/15/12  1551   First MD Initiated Contact with Patient 10/15/12 1613      No chief complaint on file.   (Consider location/radiation/quality/duration/timing/severity/associated sxs/prior treatment) HPI....primary care physician has lost his prescribing privileges. Patient is now completely out of her medications. She has been told to come to emergency department for refills. She is complaining of pain and anxiety. Nothing makes symptoms better or worse. Severity is moderate.  Past Medical History  Diagnosis Date  . Anxiety   . Depression   . ADHD (attention deficit hyperactivity disorder)     Past Surgical History  Procedure Laterality Date  . Cholecystectomy    . Tubal ligation    . Tubal ligation      Family History  Problem Relation Age of Onset  . Pneumonia Other     History  Substance Use Topics  . Smoking status: Current Every Day Smoker -- 1.00 packs/day    Types: Cigarettes  . Smokeless tobacco: Not on file  . Alcohol Use: Yes     Comment: wine monthly    OB History   Grav Para Term Preterm Abortions TAB SAB Ect Mult Living                  Review of Systems  All other systems reviewed and are negative.    Allergies  Review of patient's allergies indicates no known allergies.  Home Medications   Current Outpatient Rx  Name  Route  Sig  Dispense  Refill  . ALPRAZolam (XANAX) 1 MG tablet   Oral   Take 1 mg by mouth 4 (four) times daily. scheduled         . amphetamine-dextroamphetamine (ADDERALL) 20 MG tablet   Oral   Take 20 mg by mouth 4 (four) times daily.         . cyclobenzaprine (FLEXERIL) 10 MG tablet   Oral   Take 10 mg by mouth 3 (three) times daily.         Marland Kitchen ibuprofen (ADVIL,MOTRIN) 200 MG tablet   Oral   Take 800 mg by mouth every 6 (six) hours as needed. Pain         . olanzapine-FLUoxetine (SYMBYAX) 6-25 MG per capsule   Oral   Take 1 capsule by mouth every  evening.          Marland Kitchen oxyCODONE-acetaminophen (PERCOCET/ROXICET) 5-325 MG per tablet   Oral   Take 1 tablet by mouth 4 (four) times daily as needed for pain.         . traMADol (ULTRAM) 50 MG tablet   Oral   Take 50 mg by mouth 2 (two) times daily as needed for pain.         Marland Kitchen ALPRAZolam (XANAX) 1 MG tablet   Oral   Take 1 tablet (1 mg total) by mouth 4 (four) times daily as needed for sleep.   28 tablet   0   . amphetamine-dextroamphetamine (ADDERALL) 20 MG tablet   Oral   Take 1 tablet (20 mg total) by mouth QID.   28 tablet   0   . oxyCODONE-acetaminophen (PERCOCET/ROXICET) 5-325 MG per tablet   Oral   Take 1 tablet by mouth 4 (four) times daily as needed for pain.   28 tablet   0   . traMADol (ULTRAM) 50 MG tablet   Oral   Take 1 tablet (50 mg total) by mouth 4 (four)  times daily as needed for pain.   28 tablet   0     BP 124/83  Pulse 120  Temp(Src) 98.6 F (37 C) (Oral)  Resp 20  Ht 5\' 9"  (1.753 m)  Wt 240 lb (108.863 kg)  BMI 35.43 kg/m2  SpO2 98%  LMP 09/18/2012  Physical Exam  Nursing note and vitals reviewed. Constitutional: She is oriented to person, place, and time. She appears well-developed and well-nourished.  HENT:  Head: Normocephalic and atraumatic.  Eyes: Conjunctivae and EOM are normal. Pupils are equal, round, and reactive to light.  Neck: Normal range of motion. Neck supple.  Cardiovascular: Normal rate, regular rhythm and normal heart sounds.   Pulmonary/Chest: Effort normal and breath sounds normal.  Abdominal: Soft. Bowel sounds are normal.  Musculoskeletal: Normal range of motion.  Neurological: She is alert and oriented to person, place, and time.  Skin: Skin is warm and dry.  Psychiatric:  anxious    ED Course  Procedures (including critical care time)  Labs Reviewed - No data to display No results found.   1. Depression   2. Anxiety       MDM  As a group, we have been instructed to help people with refills  that have lost their primary care relationship. I will compromise by providing 1 weeks of prescription. Resource guide given for alternative primary care possibilities        Donnetta Hutching, MD 10/15/12 [phone]

## 2012-10-15 NOTE — ED Notes (Signed)
Pt presents with need to have medications refilled as her family MD is no longer in practice.  Pt is very tearful and anxious at this time.  Pt reports Dr Audrie Lia is no longer in practice as she found out by way of being denied her refills on current medications.  Pharm rec being completed at this time. NAD noted at this time.

## 2013-07-28 ENCOUNTER — Encounter (HOSPITAL_COMMUNITY): Payer: Self-pay | Admitting: Emergency Medicine

## 2013-07-28 ENCOUNTER — Emergency Department (HOSPITAL_COMMUNITY): Payer: Self-pay

## 2013-07-28 ENCOUNTER — Emergency Department (HOSPITAL_COMMUNITY)
Admission: EM | Admit: 2013-07-28 | Discharge: 2013-07-28 | Disposition: A | Discharge status: Home / Self Care | Payer: Self-pay | Attending: Emergency Medicine | Admitting: Emergency Medicine

## 2013-07-28 DIAGNOSIS — Z79899 Other long term (current) drug therapy: Secondary | ICD-10-CM | POA: Insufficient documentation

## 2013-07-28 DIAGNOSIS — J40 Bronchitis, not specified as acute or chronic: Secondary | ICD-10-CM

## 2013-07-28 DIAGNOSIS — F3289 Other specified depressive episodes: Secondary | ICD-10-CM | POA: Insufficient documentation

## 2013-07-28 DIAGNOSIS — F17200 Nicotine dependence, unspecified, uncomplicated: Secondary | ICD-10-CM | POA: Insufficient documentation

## 2013-07-28 DIAGNOSIS — F411 Generalized anxiety disorder: Secondary | ICD-10-CM | POA: Insufficient documentation

## 2013-07-28 DIAGNOSIS — J209 Acute bronchitis, unspecified: Secondary | ICD-10-CM | POA: Insufficient documentation

## 2013-07-28 DIAGNOSIS — F329 Major depressive disorder, single episode, unspecified: Secondary | ICD-10-CM | POA: Insufficient documentation

## 2013-07-28 DIAGNOSIS — F909 Attention-deficit hyperactivity disorder, unspecified type: Secondary | ICD-10-CM | POA: Insufficient documentation

## 2013-07-28 MED ORDER — PSEUDOEPHEDRINE HCL 60 MG PO TABS
60.0000 mg | ORAL_TABLET | Freq: Once | ORAL | Status: AC
  Administered 2013-07-28: 60 mg via ORAL
  Filled 2013-07-28: qty 1

## 2013-07-28 MED ORDER — PREDNISONE 50 MG PO TABS
60.0000 mg | ORAL_TABLET | Freq: Once | ORAL | Status: AC
  Administered 2013-07-28: 60 mg via ORAL
  Filled 2013-07-28 (×2): qty 1

## 2013-07-28 MED ORDER — CIPROFLOXACIN HCL 500 MG PO TABS: 500.0000 mg | ORAL_TABLET | Freq: Two times a day (BID) | ORAL | Status: DC

## 2013-07-28 MED ORDER — HYDROCOD POLST-CHLORPHEN POLST 10-8 MG/5ML PO LQCR: 5.0000 mL | Freq: Two times a day (BID) | ORAL | Status: DC | PRN

## 2013-07-28 MED ORDER — PREDNISONE 10 MG PO TABS: ORAL_TABLET | ORAL | Status: DC

## 2013-07-28 MED ORDER — ALBUTEROL SULFATE HFA 108 (90 BASE) MCG/ACT IN AERS
2.0000 | INHALATION_SPRAY | RESPIRATORY_TRACT | Status: DC | PRN
  Administered 2013-07-28: 2 via RESPIRATORY_TRACT
  Filled 2013-07-28: qty 6.7

## 2013-07-28 MED ORDER — HYDROCOD POLST-CHLORPHEN POLST 10-8 MG/5ML PO LQCR
5.0000 mL | Freq: Once | ORAL | Status: AC
  Administered 2013-07-28: 5 mL via ORAL
  Filled 2013-07-28: qty 5

## 2013-07-28 MED ORDER — CIPROFLOXACIN HCL 250 MG PO TABS
500.0000 mg | ORAL_TABLET | Freq: Once | ORAL | Status: AC
  Administered 2013-07-28: 500 mg via ORAL
  Filled 2013-07-28: qty 2

## 2013-07-28 NOTE — ED Provider Notes (Signed)
CSN: [id number]     Arrival date & time 07/28/13  1216 History   First MD Initiated Contact with Patient 07/28/13 1235     Chief Complaint  Patient presents with  . Cough   (Consider location/radiation/quality/duration/timing/severity/associated sxs/prior Treatment) Patient is a 37 y.o. female presenting with cough. The history is provided by the patient.  Cough Cough characteristics:  Productive and barking Severity:  Moderate Onset quality:  Gradual Duration:  2 weeks Timing:  Intermittent Progression:  Worsening Chronicity:  New Smoker: yes   Context: upper respiratory infection and weather changes   Relieved by:  Nothing Ineffective treatments: mucinex. Associated symptoms: sinus congestion   Associated symptoms: no chest pain, no eye discharge, no shortness of breath and no wheezing     Past Medical History  Diagnosis Date  . Anxiety   . Depression   . ADHD (attention deficit hyperactivity disorder)    Past Surgical History  Procedure Laterality Date  . Cholecystectomy    . Tubal ligation    . Tubal ligation     Family History  Problem Relation Age of Onset  . Pneumonia Other    History  Substance Use Topics  . Smoking status: Current Every Day Smoker -- 1.00 packs/day    Types: Cigarettes  . Smokeless tobacco: Not on file  . Alcohol Use: Yes     Comment: wine monthly   OB History   Grav Para Term Preterm Abortions TAB SAB Ect Mult Living                 Review of Systems  Constitutional: Negative for activity change.       All ROS Neg except as noted in HPI  HENT: Negative for nosebleeds.   Eyes: Negative for photophobia and discharge.  Respiratory: Positive for cough. Negative for shortness of breath and wheezing.   Cardiovascular: Negative for chest pain and palpitations.  Gastrointestinal: Negative for abdominal pain and blood in stool.  Genitourinary: Negative for dysuria, frequency and hematuria.  Musculoskeletal: Negative for arthralgias,  back pain and neck pain.  Skin: Negative.   Neurological: Negative for dizziness, seizures and speech difficulty.  Psychiatric/Behavioral: Negative for hallucinations and confusion. The patient is nervous/anxious.        Depression    Allergies  Review of patient's allergies indicates no known allergies.  Home Medications   Current Outpatient Rx  Name  Route  Sig  Dispense  Refill  . ALPRAZolam (XANAX) 1 MG tablet   Oral   Take 1 mg by mouth 4 (four) times daily. scheduled         . ALPRAZolam (XANAX) 1 MG tablet   Oral   Take 1 tablet (1 mg total) by mouth 4 (four) times daily as needed for sleep.   28 tablet   0   . amphetamine-dextroamphetamine (ADDERALL) 20 MG tablet   Oral   Take 20 mg by mouth 4 (four) times daily.         Marland Kitchen amphetamine-dextroamphetamine (ADDERALL) 20 MG tablet   Oral   Take 1 tablet (20 mg total) by mouth QID.   28 tablet   0   . cyclobenzaprine (FLEXERIL) 10 MG tablet   Oral   Take 10 mg by mouth 3 (three) times daily.         Marland Kitchen ibuprofen (ADVIL,MOTRIN) 200 MG tablet   Oral   Take 800 mg by mouth every 6 (six) hours as needed. Pain         .  olanzapine-FLUoxetine (SYMBYAX) 6-25 MG per capsule   Oral   Take 1 capsule by mouth every evening.          Marland Kitchen oxyCODONE-acetaminophen (PERCOCET/ROXICET) 5-325 MG per tablet   Oral   Take 1 tablet by mouth 4 (four) times daily as needed for pain.         Marland Kitchen oxyCODONE-acetaminophen (PERCOCET/ROXICET) 5-325 MG per tablet   Oral   Take 1 tablet by mouth 4 (four) times daily as needed for pain.   28 tablet   0   . traMADol (ULTRAM) 50 MG tablet   Oral   Take 50 mg by mouth 2 (two) times daily as needed for pain.         . traMADol (ULTRAM) 50 MG tablet   Oral   Take 1 tablet (50 mg total) by mouth 4 (four) times daily as needed for pain.   28 tablet   0    BP 121/76  Pulse 93  Temp(Src) 98.2 F (36.8 C) (Oral)  Resp 18  Ht 5\' 9"  (1.753 m)  Wt 260 lb (117.935 kg)  BMI 38.38  kg/m2  SpO2 99%  LMP 07/02/2013 Physical Exam  Nursing note and vitals reviewed. Constitutional: She is oriented to person, place, and time. She appears well-developed and well-nourished.  Non-toxic appearance.  HENT:  Head: Normocephalic.  Right Ear: Tympanic membrane and external ear normal.  Left Ear: Tympanic membrane and external ear normal.  congestion  Eyes: EOM and lids are normal. Pupils are equal, round, and reactive to light.  Neck: Normal range of motion. Neck supple. Carotid bruit is not present.  Cardiovascular: Normal rate, regular rhythm, normal heart sounds, intact distal pulses and normal pulses.   Pulmonary/Chest: No respiratory distress. She has wheezes. She has rhonchi.  Abdominal: Soft. Bowel sounds are normal. There is no tenderness. There is no guarding.  Musculoskeletal: Normal range of motion.  Lymphadenopathy:       Head (right side): No submandibular adenopathy present.       Head (left side): No submandibular adenopathy present.    She has no cervical adenopathy.  Neurological: She is alert and oriented to person, place, and time. She has normal strength. No cranial nerve deficit or sensory deficit.  Skin: Skin is warm and dry.  Psychiatric: She has a normal mood and affect. Her speech is normal.    ED Course  Procedures (including critical care time) Labs Review Labs Reviewed - No data to display Imaging Review Dg Chest 2 View  07/28/2013   CLINICAL DATA:  Dry cough, wheezing  EXAM: CHEST  2 VIEW  COMPARISON:  10/15/2011  FINDINGS: The heart size and mediastinal contours are within normal limits. Both lungs are clear. The visualized skeletal structures are unremarkable.  IMPRESSION: No active cardiopulmonary disease.   Electronically Signed   By: Ruel Favors M.D.   On: 07/28/2013 13:04    EKG Interpretation   None       MDM  No diagnosis found. **I have reviewed nursing notes, vital signs, and all appropriate lab and imaging results for this  patient.*  Pulse ox 99% on room air. WNL by my interpretation. Chest xray is negative for acute problem Cough and wheezing improving after Neb treatment. Plan -  Rx for tussionex, cipro, prednisone given to the patient. Pt to follow up with PCP for recheck.  Kathie Dike, PA-C 07/29/13 1742

## 2013-07-28 NOTE — ED Notes (Addendum)
Pt states dry cough x 2 weeks with wheezing in the morning and at night. Chest discomfort when coughing. NAD. Coughing so bad at times that she heaves. Pt states she would just like some medication and be on her way.

## 2013-08-06 NOTE — ED Provider Notes (Signed)
Medical screening examination/treatment/procedure(s) were performed by non-physician practitioner and as supervising physician I was immediately available for consultation/collaboration.  EKG Interpretation   None         Aven Cegielski J Cheila Wickstrom, MD 08/06/13 0657 

## 2013-09-09 ENCOUNTER — Encounter (HOSPITAL_COMMUNITY): Payer: Self-pay | Admitting: Emergency Medicine

## 2013-09-09 ENCOUNTER — Emergency Department (HOSPITAL_COMMUNITY)
Admission: EM | Admit: 2013-09-09 | Discharge: 2013-09-09 | Disposition: A | Discharge status: Home / Self Care | Payer: Self-pay | Attending: Emergency Medicine | Admitting: Emergency Medicine

## 2013-09-09 DIAGNOSIS — K0889 Other specified disorders of teeth and supporting structures: Secondary | ICD-10-CM

## 2013-09-09 DIAGNOSIS — K029 Dental caries, unspecified: Secondary | ICD-10-CM | POA: Insufficient documentation

## 2013-09-09 DIAGNOSIS — K002 Abnormalities of size and form of teeth: Secondary | ICD-10-CM | POA: Insufficient documentation

## 2013-09-09 DIAGNOSIS — F17200 Nicotine dependence, unspecified, uncomplicated: Secondary | ICD-10-CM | POA: Insufficient documentation

## 2013-09-09 DIAGNOSIS — K089 Disorder of teeth and supporting structures, unspecified: Secondary | ICD-10-CM | POA: Insufficient documentation

## 2013-09-09 DIAGNOSIS — Z79899 Other long term (current) drug therapy: Secondary | ICD-10-CM | POA: Insufficient documentation

## 2013-09-09 DIAGNOSIS — F329 Major depressive disorder, single episode, unspecified: Secondary | ICD-10-CM | POA: Insufficient documentation

## 2013-09-09 DIAGNOSIS — F3289 Other specified depressive episodes: Secondary | ICD-10-CM | POA: Insufficient documentation

## 2013-09-09 MED ORDER — AMOXICILLIN 250 MG PO CAPS
500.0000 mg | ORAL_CAPSULE | Freq: Once | ORAL | Status: AC
  Administered 2013-09-09: 500 mg via ORAL
  Filled 2013-09-09: qty 2

## 2013-09-09 MED ORDER — TRAMADOL HCL 50 MG PO TABS
50.0000 mg | ORAL_TABLET | Freq: Once | ORAL | Status: AC
  Administered 2013-09-09: 50 mg via ORAL
  Filled 2013-09-09: qty 1

## 2013-09-09 MED ORDER — AMOXICILLIN 500 MG PO CAPS: 500.0000 mg | ORAL_CAPSULE | Freq: Three times a day (TID) | ORAL | Status: DC

## 2013-09-09 MED ORDER — TRAMADOL HCL 50 MG PO TABS: 50.0000 mg | ORAL_TABLET | Freq: Four times a day (QID) | ORAL | Status: DC | PRN

## 2013-09-09 NOTE — Discharge Instructions (Signed)
Dental Pain °Toothache is pain in or around a tooth. It may get worse with chewing or with cold or heat.  °HOME CARE °· Your dentist may use a numbing medicine during treatment. If so, you may need to avoid eating until the medicine wears off. Ask your dentist about this. °· Only take medicine as told by your dentist or doctor. °· Avoid chewing food near the painful tooth until after all treatment is done. Ask your dentist about this. °GET HELP RIGHT AWAY IF:  °· The problem gets worse or new problems appear. °· You have a fever. °· There is redness and puffiness (swelling) of the face, jaw, or neck. °· You cannot open your mouth. °· There is pain in the jaw. °· There is very bad pain that is not helped by medicine. °MAKE SURE YOU:  °· Understand these instructions. °· Will watch your condition. °· Will get help right away if you are not doing well or get worse. °Document Released: 01/21/2008 Document Revised: 10/27/2011 Document Reviewed: 01/21/2008 °[employer]® Patient Information ©2014 [employer]. ° °

## 2013-09-09 NOTE — ED Provider Notes (Signed)
CSN: [id number]     Arrival date & time 09/09/13  1240 History   First MD Initiated Contact with Patient 09/09/13 1349     Chief Complaint  Patient presents with  . Dental Pain   (Consider location/radiation/quality/duration/timing/severity/associated sxs/prior Treatment) Patient is a 38 y.o. female presenting with tooth pain. The history is provided by the patient.  Dental Pain Location:  Lower and upper Upper teeth location:  2/RU 2nd molar Lower teeth location:  31/RL 2nd molar and 29/RL 2nd bicuspid Quality:  Throbbing Severity:  Severe Onset quality:  Gradual Duration:  1 week Timing:  Constant Progression:  Worsening Chronicity:  New Context: dental caries and poor dentition   Context: not abscess, not malocclusion, not recent dental surgery and not trauma   Relieved by:  Nothing Worsened by:  Cold food/drink and hot food/drink Ineffective treatments:  Acetaminophen and topical anesthetic gel Associated symptoms: facial pain   Associated symptoms: no congestion, no difficulty swallowing, no drooling, no facial swelling, no fever, no gum swelling, no headaches, no neck pain, no neck swelling and no trismus   Risk factors: lack of dental care, periodontal disease and smoking   Risk factors: no diabetes     Past Medical History  Diagnosis Date  . Anxiety   . Depression   . ADHD (attention deficit hyperactivity disorder)    Past Surgical History  Procedure Laterality Date  . Cholecystectomy    . Tubal ligation    . Tubal ligation     Family History  Problem Relation Age of Onset  . Pneumonia Other    History  Substance Use Topics  . Smoking status: Current Every Day Smoker -- 1.00 packs/day    Types: Cigarettes  . Smokeless tobacco: Never Used  . Alcohol Use: Yes     Comment: wine monthly   OB History   Grav Para Term Preterm Abortions TAB SAB Ect Mult Living   3 2 2  1 1          Review of Systems  Constitutional: Negative for fever and appetite change.    HENT: Positive for dental problem. Negative for congestion, drooling, facial swelling, sore throat and trouble swallowing.   Eyes: Negative for pain and visual disturbance.  Musculoskeletal: Negative for neck pain and neck stiffness.  Neurological: Negative for dizziness, facial asymmetry and headaches.  Hematological: Negative for adenopathy.  All other systems reviewed and are negative.    Allergies  Review of patient's allergies indicates no known allergies.  Home Medications   Current Outpatient Rx  Name  Route  Sig  Dispense  Refill  . acetaminophen (TYLENOL) 500 MG tablet   Oral   Take 1,000 mg by mouth every 6 (six) hours as needed for mild pain.         . benzocaine (ORAJEL) 10 % mucosal gel   Mouth/Throat   Use as directed 1 application in the mouth or throat as needed for mouth pain.         Marland Kitchen. olanzapine-FLUoxetine (SYMBYAX) 6-25 MG per capsule   Oral   Take 1 capsule by mouth every evening.           BP 152/83  Pulse 109  Temp(Src) 98.3 F (36.8 C) (Oral)  Resp 18  Ht 5\' 9"  (1.753 m)  Wt 260 lb (117.935 kg)  BMI 38.38 kg/m2  SpO2 98%  LMP 09/01/2013 Physical Exam  Nursing note and vitals reviewed. Constitutional: She is oriented to person, place, and time. She appears well-developed  and well-nourished. No distress.  HENT:  Head: Normocephalic and atraumatic.  Right Ear: Tympanic membrane and ear canal normal.  Left Ear: Tympanic membrane and ear canal normal.  Mouth/Throat: Uvula is midline, oropharynx is clear and moist and mucous membranes are normal. No trismus in the jaw. Dental caries present. No dental abscesses or uvula swelling.    Dental caries and ttp of #31, #2, #29.  No facial swelling, obvious dental abscess, trismus, or sublingual abnml.  Widespread dental disease  Neck: Normal range of motion. Neck supple.  Cardiovascular: Normal rate, regular rhythm and normal heart sounds.   No murmur heard. Pulmonary/Chest: Effort normal and  breath sounds normal.  Musculoskeletal: Normal range of motion.  Lymphadenopathy:    She has no cervical adenopathy.  Neurological: She is alert and oriented to person, place, and time. She exhibits normal muscle tone. Coordination normal.  Skin: Skin is warm and dry.    ED Course  Procedures (including critical care time) Labs Review Labs Reviewed - No data to display Imaging Review No results found.  EKG Interpretation   None       MDM    Pt is well appearing.  No concerning sx's for abscess or Ludwig's angina.  Referral info given,  Ultram #15 and amoxil prescribed.  Pt stable for d/c    Lakyn Alsteen L. Sherrilyn Nairn, PA-C 09/11/13 1400

## 2013-09-09 NOTE — ED Notes (Signed)
Pt c/o R upper and lower dental pain, also front bottom.

## 2013-09-12 NOTE — ED Provider Notes (Signed)
Medical screening examination/treatment/procedure(s) were performed by non-physician practitioner and as supervising physician I was immediately available for consultation/collaboration.  EKG Interpretation   None         Leonetta Mcgivern M Rudra Hobbins, DO 09/12/13 0711 

## 2013-09-26 ENCOUNTER — Emergency Department (HOSPITAL_COMMUNITY)
Admission: EM | Admit: 2013-09-26 | Discharge: 2013-09-26 | Discharge status: Against Medical Advice | Payer: Self-pay | Attending: Emergency Medicine | Admitting: Emergency Medicine

## 2013-09-26 ENCOUNTER — Encounter (HOSPITAL_COMMUNITY): Payer: Self-pay | Admitting: Emergency Medicine

## 2013-09-26 DIAGNOSIS — R6884 Jaw pain: Secondary | ICD-10-CM | POA: Insufficient documentation

## 2013-09-26 DIAGNOSIS — K089 Disorder of teeth and supporting structures, unspecified: Secondary | ICD-10-CM | POA: Insufficient documentation

## 2013-09-26 DIAGNOSIS — R221 Localized swelling, mass and lump, neck: Secondary | ICD-10-CM

## 2013-09-26 DIAGNOSIS — R220 Localized swelling, mass and lump, head: Secondary | ICD-10-CM | POA: Insufficient documentation

## 2013-09-26 NOTE — ED Notes (Signed)
Pain , swelling rt lower jaw for 2 days, Has rt mandibular dental caries.

## 2013-09-26 NOTE — ED Notes (Signed)
Swelling pain rt lower jaw, for 2 days

## 2013-09-26 NOTE — ED Notes (Signed)
Pt left, says she  Has  A family member who had a heart attack [facility]

## 2013-12-29 ENCOUNTER — Emergency Department (HOSPITAL_COMMUNITY)
Admission: EM | Admit: 2013-12-29 | Discharge: 2013-12-29 | Disposition: A | Discharge status: Home / Self Care | Payer: Self-pay | Attending: Emergency Medicine | Admitting: Emergency Medicine

## 2013-12-29 ENCOUNTER — Encounter (HOSPITAL_COMMUNITY): Payer: Self-pay | Admitting: Emergency Medicine

## 2013-12-29 DIAGNOSIS — K047 Periapical abscess without sinus: Secondary | ICD-10-CM

## 2013-12-29 DIAGNOSIS — F329 Major depressive disorder, single episode, unspecified: Secondary | ICD-10-CM | POA: Insufficient documentation

## 2013-12-29 DIAGNOSIS — F3289 Other specified depressive episodes: Secondary | ICD-10-CM | POA: Insufficient documentation

## 2013-12-29 DIAGNOSIS — F411 Generalized anxiety disorder: Secondary | ICD-10-CM | POA: Insufficient documentation

## 2013-12-29 DIAGNOSIS — Z791 Long term (current) use of non-steroidal anti-inflammatories (NSAID): Secondary | ICD-10-CM | POA: Insufficient documentation

## 2013-12-29 DIAGNOSIS — F17200 Nicotine dependence, unspecified, uncomplicated: Secondary | ICD-10-CM | POA: Insufficient documentation

## 2013-12-29 DIAGNOSIS — K029 Dental caries, unspecified: Secondary | ICD-10-CM | POA: Insufficient documentation

## 2013-12-29 DIAGNOSIS — R51 Headache: Secondary | ICD-10-CM | POA: Insufficient documentation

## 2013-12-29 MED ORDER — ACETAMINOPHEN-CODEINE #3 300-30 MG PO TABS
2.0000 | ORAL_TABLET | Freq: Once | ORAL | Status: AC
  Administered 2013-12-29: 2 via ORAL
  Filled 2013-12-29: qty 2

## 2013-12-29 MED ORDER — AMOXICILLIN 500 MG PO CAPS: 500.0000 mg | ORAL_CAPSULE | Freq: Three times a day (TID) | ORAL | Status: DC

## 2013-12-29 MED ORDER — PENICILLIN V POTASSIUM 250 MG PO TABS
500.0000 mg | ORAL_TABLET | Freq: Once | ORAL | Status: AC
  Administered 2013-12-29: 500 mg via ORAL
  Filled 2013-12-29: qty 2

## 2013-12-29 MED ORDER — MELOXICAM 7.5 MG PO TABS: ORAL_TABLET | ORAL | Status: DC

## 2013-12-29 MED ORDER — BUPIVACAINE-EPINEPHRINE 0.25% -1:200000 IJ SOLN: 20.0000 mL | Freq: Once | INTRAMUSCULAR | Status: DC

## 2013-12-29 MED ORDER — BENZOCAINE 20 % MT SOLN
OROMUCOSAL | Status: AC
  Administered 2013-12-29: 1
  Filled 2013-12-29: qty 57

## 2013-12-29 MED ORDER — BUPIVACAINE-EPINEPHRINE (PF) 0.5% -1:200000 IJ SOLN
INTRAMUSCULAR | Status: AC
  Filled 2013-12-29: qty 30

## 2013-12-29 MED ORDER — PROMETHAZINE HCL 12.5 MG PO TABS
25.0000 mg | ORAL_TABLET | Freq: Once | ORAL | Status: AC
  Administered 2013-12-29: 25 mg via ORAL
  Filled 2013-12-29: qty 2

## 2013-12-29 MED ORDER — KETOROLAC TROMETHAMINE 10 MG PO TABS
10.0000 mg | ORAL_TABLET | Freq: Once | ORAL | Status: AC
  Administered 2013-12-29: 10 mg via ORAL
  Filled 2013-12-29: qty 1

## 2013-12-29 MED ORDER — ACETAMINOPHEN-CODEINE #3 300-30 MG PO TABS: 1.0000 | ORAL_TABLET | Freq: Four times a day (QID) | ORAL | Status: DC | PRN

## 2013-12-29 MED ORDER — BUPIVACAINE-EPINEPHRINE (PF) 0.5% -1:200000 IJ SOLN
10.0000 mL | Freq: Once | INTRAMUSCULAR | Status: AC
  Administered 2013-12-29: 10 mL

## 2013-12-29 MED ORDER — BENZOCAINE 20 % MT SOLN: Freq: Four times a day (QID) | OROMUCOSAL | Status: DC | PRN

## 2013-12-29 MED ORDER — BENZOCAINE (TOPICAL) 20 % EX AERO: INHALATION_SPRAY | Freq: Four times a day (QID) | CUTANEOUS | Status: DC | PRN

## 2013-12-29 NOTE — Discharge Instructions (Signed)
Dental Caries PLEASE TAKE YOUR AMOXIL AND MOBIC DAILY UNTIL ALL TAKEN. IT IS IMPORTANT THAT YOU SEE A DENTIST AS SOON AS POSSIBLE TO PREVENT SPREAD OF YOUR DENTAL INFECTION. Dental caries is tooth decay. This decay can cause a hole in teeth (cavity) that can get bigger and deeper over time. HOME CARE  Brush and floss your teeth. Do this at least two times a day.  Use a fluoride toothpaste.  Use a mouth rinse if told by your dentist or doctor.  Eat less sugary and starchy foods. Drink less sugary drinks.  Avoid snacking often on sugary and starchy foods. Avoid sipping often on sugary drinks.  Keep regular checkups and cleanings with your dentist.  Use fluoride supplements if told by your dentist or doctor.  Allow fluoride to be applied to teeth if told by your dentist or doctor. MAKE SURE YOU:  Understand these instructions.  Will watch your condition.  Will get help right away if you are not doing well or get worse. Document Released: 05/13/2008 Document Revised: 04/06/2013 Document Reviewed: 08/06/2012 [facility] Patient Information 2014 [employer], [place].

## 2013-12-29 NOTE — ED Provider Notes (Signed)
CSN: [id number]     Arrival date & time 12/29/13  0825 History   First MD Initiated Contact with Patient 12/29/13 0930     Chief Complaint  Patient presents with  . Dental Pain     (Consider location/radiation/quality/duration/timing/severity/associated sxs/prior Treatment) Patient is a 38 y.o. female presenting with tooth pain. The history is provided by the patient.  Dental Pain Location:  Upper and lower Quality:  Aching and throbbing Severity:  Severe Onset quality:  Gradual Duration: acute on chronic dental issues. Timing:  Intermittent Progression:  Worsening Chronicity:  Chronic Context: dental caries and poor dentition   Relieved by:  Nothing Worsened by:  Hot food/drink Ineffective treatments:  Acetaminophen Associated symptoms: headaches   Associated symptoms: no fever, no neck pain and no trismus   Risk factors: lack of dental care, periodontal disease and smoking   Risk factors: no immunosuppression     Past Medical History  Diagnosis Date  . Anxiety   . Depression   . ADHD (attention deficit hyperactivity disorder)    Past Surgical History  Procedure Laterality Date  . Cholecystectomy    . Tubal ligation    . Tubal ligation     Family History  Problem Relation Age of Onset  . Pneumonia Other    History  Substance Use Topics  . Smoking status: Current Every Day Smoker -- 1.00 packs/day    Types: Cigarettes  . Smokeless tobacco: Never Used  . Alcohol Use: Yes     Comment: wine monthly   OB History   Grav Para Term Preterm Abortions TAB SAB Ect Mult Living   3 2 2  1 1          Review of Systems  Constitutional: Negative for fever and activity change.       All ROS Neg except as noted in HPI  HENT: Positive for dental problem. Negative for nosebleeds.   Eyes: Negative for photophobia and discharge.  Respiratory: Negative for cough, shortness of breath and wheezing.   Cardiovascular: Negative for chest pain and palpitations.  Gastrointestinal:  Negative for abdominal pain and blood in stool.  Genitourinary: Negative for dysuria, frequency and hematuria.  Musculoskeletal: Negative for arthralgias, back pain and neck pain.  Skin: Negative.   Neurological: Positive for headaches. Negative for dizziness, seizures and speech difficulty.  Psychiatric/Behavioral: Negative for hallucinations and confusion. The patient is nervous/anxious.       Allergies  Review of patient's allergies indicates no known allergies.  Home Medications   Prior to Admission medications   Medication Sig Start Date End Date Taking? Authorizing Provider  amoxicillin (AMOXIL) 500 MG capsule Take 1 capsule (500 mg total) by mouth 3 (three) times daily. 09/09/13   Tammy L. Triplett, PA-C  ibuprofen (ADVIL,MOTRIN) 200 MG tablet Take 400 mg by mouth every 6 (six) hours as needed.    Historical Provider, MD  olanzapine-FLUoxetine (SYMBYAX) 6-25 MG per capsule Take 1 capsule by mouth every evening.     Historical Provider, MD   BP 128/75  Pulse 83  Temp(Src) 97.5 F (36.4 C) (Oral)  Resp 16  Ht 5\' 8"  (1.727 m)  Wt 260 lb (117.935 kg)  BMI 39.54 kg/m2  SpO2 100%  LMP 12/15/2013 Physical Exam  Nursing note and vitals reviewed. Constitutional: She is oriented to person, place, and time. She appears well-developed and well-nourished.  Non-toxic appearance.  HENT:  Head: Normocephalic.  Right Ear: Tympanic membrane and external ear normal.  Left Ear: Tympanic membrane and external ear  normal.  Multiple dental caries of the upper and lower areas. No visible abscess. Airway patent. No swelling under the tongue.  Eyes: EOM and lids are normal. Pupils are equal, round, and reactive to light.  Neck: Normal range of motion. Neck supple. Carotid bruit is not present.  Cardiovascular: Normal rate, regular rhythm, normal heart sounds, intact distal pulses and normal pulses.   Pulmonary/Chest: Breath sounds normal. No respiratory distress.  Abdominal: Soft. Bowel sounds  are normal. There is no tenderness. There is no guarding.  Musculoskeletal: Normal range of motion.  Lymphadenopathy:       Head (right side): No submandibular adenopathy present.       Head (left side): No submandibular adenopathy present.    She has no cervical adenopathy.  Neurological: She is alert and oriented to person, place, and time. She has normal strength. No cranial nerve deficit or sensory deficit.  Skin: Skin is warm and dry.  Psychiatric: She has a normal mood and affect. Her speech is normal.    ED Course  Dental Date/Time: 12/29/2013 11:05 AM Performed by: Kathie DikeBRYANT, Calandra Madura M Authorized by: Kathie DikeBRYANT, Jerrika Ledlow M Consent: Verbal consent obtained. Risks and benefits: risks, benefits and alternatives were discussed Consent given by: patient Patient understanding: patient states understanding of the procedure being performed Patient identity confirmed: arm band Time out: Immediately prior to procedure a "time out" was called to verify the correct patient, procedure, equipment, support staff and site/side marked as required. Preparation: Patient was prepped and draped in the usual sterile fashion. Local anesthesia used: yes Local anesthetic: bupivacaine 0.25% with epinephrine Patient sedated: no Patient tolerance: Patient tolerated the procedure well with no immediate complications. Comments: Pt reports significant improvement in pain after dental block. No complication.   (including critical care time) Labs Review Labs Reviewed - No data to display  Imaging Review No results found.   EKG Interpretation None      MDM Pt has multiple dental caries. Pt advised to see a dentist as soon as possible as this is one of several ED visits for dental pain control. Rx for amoxil, ultram, and mobic given.   Final diagnoses:  None    **I have reviewed nursing notes, vital signs, and all appropriate lab and imaging results for this patient.Kathie Dike*    Jaymen Fetch M Adaijah Endres,  PA-C 12/29/13 [phone]

## 2013-12-29 NOTE — ED Notes (Signed)
Complain of dental pain and swelling  

## 2013-12-29 NOTE — ED Provider Notes (Signed)
Medical screening examination/treatment/procedure(s) were performed by non-physician practitioner and as supervising physician I was immediately available for consultation/collaboration.     Reann Dobias, MD 12/29/13 1313 

## 2013-12-29 NOTE — ED Notes (Signed)
Pt set up for dental block-, family/friends at bedside with pt

## 2014-06-19 ENCOUNTER — Encounter (HOSPITAL_COMMUNITY): Payer: Self-pay | Admitting: Emergency Medicine

## 2014-08-31 ENCOUNTER — Emergency Department (HOSPITAL_COMMUNITY): Payer: Self-pay

## 2014-08-31 ENCOUNTER — Emergency Department (HOSPITAL_COMMUNITY)
Admission: EM | Admit: 2014-08-31 | Discharge: 2014-08-31 | Disposition: A | Discharge status: Home / Self Care | Payer: Self-pay | Attending: Emergency Medicine | Admitting: Emergency Medicine

## 2014-08-31 ENCOUNTER — Encounter (HOSPITAL_COMMUNITY): Payer: Self-pay | Admitting: Emergency Medicine

## 2014-08-31 DIAGNOSIS — Z3202 Encounter for pregnancy test, result negative: Secondary | ICD-10-CM | POA: Insufficient documentation

## 2014-08-31 DIAGNOSIS — Z9851 Tubal ligation status: Secondary | ICD-10-CM | POA: Insufficient documentation

## 2014-08-31 DIAGNOSIS — N760 Acute vaginitis: Secondary | ICD-10-CM | POA: Insufficient documentation

## 2014-08-31 DIAGNOSIS — R102 Pelvic and perineal pain: Secondary | ICD-10-CM

## 2014-08-31 DIAGNOSIS — F419 Anxiety disorder, unspecified: Secondary | ICD-10-CM | POA: Insufficient documentation

## 2014-08-31 DIAGNOSIS — B9689 Other specified bacterial agents as the cause of diseases classified elsewhere: Secondary | ICD-10-CM

## 2014-08-31 DIAGNOSIS — Z79899 Other long term (current) drug therapy: Secondary | ICD-10-CM | POA: Insufficient documentation

## 2014-08-31 DIAGNOSIS — F329 Major depressive disorder, single episode, unspecified: Secondary | ICD-10-CM | POA: Insufficient documentation

## 2014-08-31 DIAGNOSIS — R945 Abnormal results of liver function studies: Secondary | ICD-10-CM

## 2014-08-31 DIAGNOSIS — R7989 Other specified abnormal findings of blood chemistry: Secondary | ICD-10-CM | POA: Insufficient documentation

## 2014-08-31 DIAGNOSIS — Z720 Tobacco use: Secondary | ICD-10-CM | POA: Insufficient documentation

## 2014-08-31 DIAGNOSIS — Z9049 Acquired absence of other specified parts of digestive tract: Secondary | ICD-10-CM | POA: Insufficient documentation

## 2014-08-31 HISTORY — DX: Other psychoactive substance abuse, uncomplicated: F19.10

## 2014-08-31 LAB — WET PREP, GENITAL
Trich, Wet Prep: NONE SEEN
Yeast Wet Prep HPF POC: NONE SEEN

## 2014-08-31 LAB — PREGNANCY, URINE: PREG TEST UR: NEGATIVE

## 2014-08-31 LAB — CBC WITH DIFFERENTIAL/PLATELET
Basophils Absolute: 0 10*3/uL (ref 0.0–0.1)
Basophils Relative: 1 % (ref 0–1)
EOS ABS: 0.2 10*3/uL (ref 0.0–0.7)
Eosinophils Relative: 2 % (ref 0–5)
HEMATOCRIT: 42.1 % (ref 36.0–46.0)
Hemoglobin: 13.8 g/dL (ref 12.0–15.0)
LYMPHS ABS: 1.7 10*3/uL (ref 0.7–4.0)
Lymphocytes Relative: 24 % (ref 12–46)
MCH: 29.9 pg (ref 26.0–34.0)
MCHC: 32.8 g/dL (ref 30.0–36.0)
MCV: 91.1 fL (ref 78.0–100.0)
MONO ABS: 0.6 10*3/uL (ref 0.1–1.0)
Monocytes Relative: 9 % (ref 3–12)
Neutro Abs: 4.7 10*3/uL (ref 1.7–7.7)
Neutrophils Relative %: 64 % (ref 43–77)
PLATELETS: 219 10*3/uL (ref 150–400)
RBC: 4.62 MIL/uL (ref 3.87–5.11)
RDW: 13.7 % (ref 11.5–15.5)
WBC: 7.2 10*3/uL (ref 4.0–10.5)

## 2014-08-31 LAB — URINALYSIS, ROUTINE W REFLEX MICROSCOPIC
Bilirubin Urine: NEGATIVE
Glucose, UA: NEGATIVE mg/dL
Hgb urine dipstick: NEGATIVE
Ketones, ur: NEGATIVE mg/dL
Leukocytes, UA: NEGATIVE
Nitrite: NEGATIVE
PH: 6.5 (ref 5.0–8.0)
PROTEIN: NEGATIVE mg/dL
UROBILINOGEN UA: 0.2 mg/dL (ref 0.0–1.0)

## 2014-08-31 LAB — COMPREHENSIVE METABOLIC PANEL
ALT: 299 U/L — AB (ref 0–35)
ANION GAP: 6 (ref 5–15)
AST: 194 U/L — ABNORMAL HIGH (ref 0–37)
Albumin: 4.2 g/dL (ref 3.5–5.2)
Alkaline Phosphatase: 146 U/L — ABNORMAL HIGH (ref 39–117)
BILIRUBIN TOTAL: 0.4 mg/dL (ref 0.3–1.2)
BUN: 11 mg/dL (ref 6–23)
CO2: 26 mmol/L (ref 19–32)
Calcium: 9.5 mg/dL (ref 8.4–10.5)
Chloride: 106 mEq/L (ref 96–112)
Creatinine, Ser: 0.89 mg/dL (ref 0.50–1.10)
GFR calc Af Amer: >90 mL/min (ref >90)
GFR, EST NON AFRICAN AMERICAN: 81 mL/min — AB (ref >90)
Glucose, Bld: 88 mg/dL (ref 70–99)
POTASSIUM: 4.1 mmol/L (ref 3.5–5.1)
Sodium: 138 mmol/L (ref 135–145)
TOTAL PROTEIN: 7.5 g/dL (ref 6.0–8.3)

## 2014-08-31 LAB — LIPASE, BLOOD: Lipase: 21 U/L (ref 11–59)

## 2014-08-31 MED ORDER — FENTANYL CITRATE 0.05 MG/ML IJ SOLN
100.0000 ug | Freq: Once | INTRAMUSCULAR | Status: AC
  Administered 2014-08-31: 100 ug via INTRAMUSCULAR
  Filled 2014-08-31: qty 2

## 2014-08-31 MED ORDER — NAPROXEN 250 MG PO TABS: 250.0000 mg | ORAL_TABLET | Freq: Two times a day (BID) | ORAL | Status: DC | PRN

## 2014-08-31 MED ORDER — METRONIDAZOLE 500 MG PO TABS: 500.0000 mg | ORAL_TABLET | Freq: Two times a day (BID) | ORAL | Status: DC

## 2014-08-31 MED ORDER — PROMETHAZINE HCL 25 MG/ML IJ SOLN
25.0000 mg | Freq: Once | INTRAMUSCULAR | Status: AC
  Administered 2014-08-31: 25 mg via INTRAMUSCULAR
  Filled 2014-08-31: qty 1

## 2014-08-31 NOTE — ED Provider Notes (Signed)
CSN: [id number]     Arrival date & time 08/31/14  1501 History   First MD Initiated Contact with Patient 08/31/14 1528     Chief Complaint  Patient presents with  . Pelvic Pain      HPI  Pt was seen at 1540.  Per pt, c/o gradual onset and persistence of constant pelvic pain for the past several months, worse over the past 3 days. Has been associated with vaginal discharge. LMP "4 months ago." States she has taken a home pregnancy test and "it was negative." Denies vaginal bleeding, no dysuria/hematuria, no flank pain, no N/V/D, no fevers, no rash.     Past Medical History  Diagnosis Date  . Anxiety   . Depression   . ADHD (attention deficit hyperactivity disorder)   . Polysubstance abuse     herion, methadone, etoh   Past Surgical History  Procedure Laterality Date  . Cholecystectomy    . Tubal ligation    . Tubal ligation     Family History  Problem Relation Age of Onset  . Pneumonia Other    History  Substance Use Topics  . Smoking status: Current Every Day Smoker -- 1.00 packs/day    Types: Cigarettes  . Smokeless tobacco: Never Used  . Alcohol Use: Yes     Comment: wine, liquor   OB History    Gravida Para Term Preterm AB TAB SAB Ectopic Multiple Living   Review of Systems ROS: Statement: All systems negative except as marked or noted in the HPI; Constitutional: Negative for fever and chills. ; ; Eyes: Negative for eye pain, redness and discharge. ; ; ENMT: Negative for ear pain, hoarseness, nasal congestion, sinus pressure and sore throat. ; ; Cardiovascular: Negative for chest pain, palpitations, diaphoresis, dyspnea and peripheral edema. ; ; Respiratory: Negative for cough, wheezing and stridor. ; ; Gastrointestinal: Negative for nausea, vomiting, diarrhea, abdominal pain, blood in stool, hematemesis, jaundice and rectal bleeding. . ; ; Genitourinary: Negative for dysuria, flank pain and hematuria. ; ; GYN:  No vaginal bleeding, +pelvic pain,  +vaginal discharge, no vulvar pain. ;; Musculoskeletal: Negative for back pain and neck pain. Negative for swelling and trauma.; ; Skin: Negative for pruritus, rash, abrasions, blisters, bruising and skin lesion.; ; Neuro: Negative for headache, lightheadedness and neck stiffness. Negative for weakness, altered level of consciousness , altered mental status, extremity weakness, paresthesias, involuntary movement, seizure and syncope.     Allergies  Review of patient's allergies indicates no known allergies.  Home Medications   Prior to Admission medications   Medication Sig Start Date End Date Taking? Authorizing Provider  Diphenhydramine-APAP, sleep, (GOODY PM PO) Take 1 packet by mouth daily as needed (for pain).   Yes Historical Provider, MD  ibuprofen (ADVIL,MOTRIN) 200 MG tablet Take 400 mg by mouth every 6 (six) hours as needed (dental pain).    Yes Historical Provider, MD  Multiple Vitamin (MULTIVITAMIN WITH MINERALS) TABS tablet Take 1 tablet by mouth daily.   Yes Historical Provider, MD  sertraline (ZOLOFT) 100 MG tablet Take 100 mg by mouth daily. 02/07/14 02/07/15 Yes Historical Provider, MD  traZODone (DESYREL) 100 MG tablet Take 100 mg by mouth at bedtime. 02/06/14  Yes Historical Provider, MD  acetaminophen-codeine (TYLENOL #3) 300-30 MG per tablet Take 1-2 tablets by mouth every 6 (six) hours as needed for moderate pain. Patient not taking: Reported on 08/31/2014 12/29/13   Lyla Son  Beverely Pace, PA-C  amoxicillin (AMOXIL) 500 MG capsule Take 1 capsule (500 mg total) by mouth 3 (three) times daily. Patient not taking: Reported on 08/31/2014 12/29/13   Kathie Dike, PA-C  meloxicam [facility]) 7.5 MG tablet 1 po bid with food Patient not taking: Reported on 08/31/2014 12/29/13   Kathie Dike, PA-C  metroNIDAZOLE (FLAGYL) 500 MG tablet Take 1 tablet (500 mg total) by mouth 2 (two) times daily. 08/31/14   Samuel Jester, DO  naproxen (NAPROSYN) 250 MG tablet Take 1 tablet (250 mg total) by  mouth 2 (two) times daily as needed for mild pain or moderate pain (take with food). 08/31/14   Samuel Jester, DO   BP 116/73 mmHg  Pulse 88  Temp(Src) 98.7 F (37.1 C) (Oral)  Resp 18  Ht  (1.753 m)  Wt 255 lb (115.667 kg)  BMI 37.64 kg/m2  SpO2 99%  LMP 05/31/2014 Physical Exam  1545: Physical examination:  Nursing notes reviewed; Vital signs and O2 SAT reviewed;  Constitutional: Well developed, Well nourished, Well hydrated, In no acute distress; Head:  Normocephalic, atraumatic; Eyes: EOMI, PERRL, No scleral icterus; ENMT: Mouth and pharynx normal, Mucous membranes moist; Neck: Supple, Full range of motion, No lymphadenopathy; Cardiovascular: Regular rate and rhythm, No murmur, rub, or gallop; Respiratory: Breath sounds clear & equal bilaterally, No rales, rhonchi, wheezes.  Speaking full sentences with ease, Normal respiratory effort/excursion; Chest: Nontender, Movement normal; Abdomen: Soft, +suprapubic tenderness to palp. No rebound or guarding. Nondistended, Normal bowel sounds; Genitourinary: No CVA tenderness. Pelvic exam performed with permission of pt and female ED tech assist during exam.  External genitalia w/o lesions. Vaginal vault with white discharge.  Cervix w/o lesions, not friable, GC/chlam and wet prep obtained and sent to lab.  Bimanual exam w/o CMT, uterine or adnexal tenderness.; Extremities: Pulses normal, No tenderness, No edema, No calf edema or asymmetry.; Neuro: AA&Ox3, Major CN grossly intact.  Speech clear. No gross focal motor or sensory deficits in extremities. Climbs on and off stretcher easily by herself. Gait steady.; Skin: Color normal, Warm, Dry.   ED Course  Procedures     EKG Interpretation None      MDM  MDM Reviewed: previous chart, nursing note and vitals Interpretation: labs and ultrasound   Results for orders placed or performed during the hospital encounter of 08/31/14  Wet prep, genital  Result Value Ref Range   Yeast Wet Prep  HPF POC NONE SEEN NONE SEEN   Trich, Wet Prep NONE SEEN NONE SEEN   Clue Cells Wet Prep HPF POC FEW (A) NONE SEEN   WBC, Wet Prep HPF POC FEW (A) NONE SEEN  CBC with Differential  Result Value Ref Range   WBC 7.2 4.0 - 10.5 K/uL   RBC 4.62 3.87 - 5.11 MIL/uL   Hemoglobin 13.8 12.0 - 15.0 g/dL   HCT 16.1 09.6 - 04.5 %   MCV 91.1 78.0 - 100.0 fL   MCH 29.9 26.0 - 34.0 pg   MCHC 32.8 30.0 - 36.0 g/dL   RDW 40.9 81.1 - 91.4 %   Platelets 219 150 - 400 K/uL   Neutrophils Relative % 64 43 - 77 %   Neutro Abs 4.7 1.7 - 7.7 K/uL   Lymphocytes Relative 24 12 - 46 %   Lymphs Abs 1.7 0.7 - 4.0 K/uL   Monocytes Relative 9 3 - 12 %   Monocytes Absolute 0.6 0.1 - 1.0 K/uL   Eosinophils Relative 2 0 - 5 %  Eosinophils Absolute 0.2 0.0 - 0.7 K/uL   Basophils Relative 1 0 - 1 %   Basophils Absolute 0.0 0.0 - 0.1 K/uL  Comprehensive metabolic panel  Result Value Ref Range   Sodium 138 135 - 145 mmol/L   Potassium 4.1 3.5 - 5.1 mmol/L   Chloride 106 96 - 112 mEq/L   CO2 26 19 - 32 mmol/L   Glucose, Bld 88 70 - 99 mg/dL   BUN 11 6 - 23 mg/dL   Creatinine, Ser 1.610.89 0.50 - 1.10 mg/dL   Calcium 9.5 8.4 - 09.610.5 mg/dL   Total Protein 7.5 6.0 - 8.3 g/dL   Albumin 4.2 3.5 - 5.2 g/dL   AST 045194 (H) 0 - 37 U/L   ALT 299 (H) 0 - 35 U/L   Alkaline Phosphatase 146 (H) 39 - 117 U/L   Total Bilirubin 0.4 0.3 - 1.2 mg/dL   GFR calc non Af Amer 81 (L) >90 mL/min   GFR calc Af Amer >90 >90 mL/min   Anion gap 6 5 - 15  Urinalysis, Routine w reflex microscopic  Result Value Ref Range   Color, Urine YELLOW YELLOW   APPearance CLEAR CLEAR   Specific Gravity, Urine <1.005 (L) 1.005 - 1.030   pH 6.5 5.0 - 8.0   Glucose, UA NEGATIVE NEGATIVE mg/dL   Hgb urine dipstick NEGATIVE NEGATIVE   Bilirubin Urine NEGATIVE NEGATIVE   Ketones, ur NEGATIVE NEGATIVE mg/dL   Protein, ur NEGATIVE NEGATIVE mg/dL   Urobilinogen, UA 0.2 0.0 - 1.0 mg/dL   Nitrite NEGATIVE NEGATIVE   Leukocytes, UA NEGATIVE NEGATIVE   Pregnancy, urine  Result Value Ref Range   Preg Test, Ur NEGATIVE NEGATIVE  Lipase, blood  Result Value Ref Range   Lipase 21 11 - 59 U/L   [place] Transvaginal Non-ob 08/31/2014   CLINICAL DATA:  39 year old female with acute pelvic pain. Initial encounter.  EXAM: TRANSABDOMINAL AND TRANSVAGINAL ULTRASOUND OF PELVIS  TECHNIQUE: Both transabdominal and transvaginal ultrasound examinations of the pelvis were performed. Transabdominal technique was performed for global imaging of the pelvis including uterus, ovaries, adnexal regions, and pelvic cul-de-sac. It was necessary to proceed with endovaginal exam following the transabdominal exam to visualize the ovaries and endometrium.  COMPARISON:  None  FINDINGS: Uterus  Measurements: 8.6 x 4.4 x 3.6 cm and anteverted. No fibroids or other mass visualized.  Endometrium  Thickness: 9.5 mm.  No focal abnormality visualized.  Right ovary  Measurements: 3.2 x 2.9 x 2.8 cm. Normal appearance/no adnexal mass. Color, arterial and venous flow identified.  Left ovary  Measurements: 4.1 x 2.1 x 3.9 cm. Normal appearance/no adnexal mass. Color, arterial and venous flow identified.  Other findings  No free fluid.  IMPRESSION: Normal pelvic ultrasound.  No evidence of ovarian torsion.   Electronically Signed   By: Laveda AbbeJeff  Hu M.D.   On: 08/31/2014 18:15    1845:  Pt has gotten herself dressed, is walking around the ED with upright/steady gait, resps easy; stating she wants to go home now. Will tx for BV. LFT's elevated; pt is already s/p cholecystectomy and she now endorses hx of etoh use. Cautioned regarding same (as well as APAP use which she denies), and emphasized need to f/u with PMD to re-check LFT's in the next week. Pt verb understanding. Dx and testing d/w pt.  Questions answered.  Verb understanding, agreeable to d/c home with outpt f/u.   Samuel JesterKathleen Meeya Goldin, DO 09/04/14 [phone]

## 2014-08-31 NOTE — Discharge Instructions (Signed)
°Emergency Department Resource Guide °1) Find a Doctor and Pay Out of Pocket °Although you won't have to find out who is covered by your insurance plan, it is a good idea to ask around and get recommendations. You will then need to call the office and see if the doctor you have chosen will accept you as a new patient and what types of options they offer for patients who are self-pay. Some doctors offer discounts or will set up payment plans for their patients who do not have insurance, but you will need to ask so you aren't surprised when you get to your appointment. ° °2) Contact Your Local Health Department °Not all health departments have doctors that can see patients for sick visits, but many do, so it is worth a call to see if yours does. If you don't know where your local health department is, you can check in your phone book. The CDC also has a tool to help you locate your state's health department, and many state websites also have listings of all of their local health departments. ° °3) Find a Walk-in Clinic °If your illness is not likely to be very severe or complicated, you may want to try a walk in clinic. These are popping up all over the country in pharmacies, drugstores, and shopping centers. They're usually staffed by nurse practitioners or physician assistants that have been trained to treat common illnesses and complaints. They're usually fairly quick and inexpensive. However, if you have serious medical issues or chronic medical problems, these are probably not your best option. ° °No Primary Care Doctor: °- Call [facility] at  832-8000 - they can help you locate a primary care doctor that  accepts your insurance, provides certain services, etc. °- Physician Referral Service- 1-800-533-3463 ° °Chronic Pain Problems: °Organization         Address  Phone   Notes  °[facility]  (336) 297-2271 Patients need to be referred by their primary care doctor.  ° °Medication  Assistance: °Organization         Address  Phone   Notes  °[place] [facility] [address] °[place], [place] [address] (336) 641-8030 --Must be a resident of [place] °-- Must have NO insurance coverage whatsoever (no Medicaid/ Medicare, etc.) °-- The pt. MUST have a primary care doctor that directs their care regularly and follows them in the community °  °[facility]  (866) 331-1348   °[employer]  (888) 892-1162   ° °Agencies that provide inexpensive medical care: °Organization         Address  Phone   Notes  °[facility]  (336) 832-8035   °[facility]    (336) 832-7272   °[facility] [address] °[place], [place] [address] (336) 832-4777   °[facility] [address], °[place] (336) 271-4999   °[facility]    (336) 373-0678   °[facility]    (336) 272-1050   °[facility] ° [address], [place] Phone:  (336) 832-4444, Fax:  (336) 832-4440 Hours of Operation:  9 am - 6 pm, M-F.  Also accepts Medicaid/Medicare and self-pay.  °[facility] ° [address], [place] Phone: (336) 832-3150, Fax: (336) [phone]. Hours of Operation:  8:30 am - 5:30 pm, M-F.  Also accepts Medicaid and self-pay.  °[facility] [place] [address], [place] Phone: (336) 878-6027   °[facility] [address], [place], [place] (336)723-1848, Ext. 123 Mondays & Thursdays: 7-9 AM.  First 15 patients are seen on a first come, first serve basis. °  ° °Medicaid-accepting [place] Providers: ° °Organization         Address  Phone   Notes  °[facility] [address], [place] (336) 641-2100 Also accepts self-pay patients.  °[facility] [address], [place] ° (336) 856-9996   °[facility] [address], [place]  (336) 288-8857   °[facility] [address] [place] [address], [place] (336) 299-7000   °Veita Bland [address], [place]  ° (336) 373-1557 Only accepts [place] Access Medicaid patients after they have their name applied to their card.  ° °Self-Pay (no insurance) in [place]: ° °Organization         Address  Phone   Notes  °Sickle Cell Patients, [facility] [address], [place] (336) 832-1970   °[facility] [address], [place] (336) 832-4400   °[facility] ° [address], [place] (336) 992-4800   °[facility]/Dr. Osei-Bonsu ° [address] [place] [address], [place] or [address], [place] (336) 841-8500 Phone number for both [place] and [place] locations is the same.  °[facility] [address], [place] (336) 299-0000   °[facility] [address] [place] [address], [place] or [address] (336) 852-7530 °(336) 878-2260   °[facility] [address], [place] (336) 350-1642, phone; (336) 294-5005, fax Sees patients 1st and 3rd Saturday of every month.  Must not qualify for public or private insurance (i.e. Medicaid, Medicare, [place] Health Choice, Veterans' Benefits) • Household income should be no more than 200% of the poverty level •The clinic cannot treat you if you are pregnant or think you are pregnant • Sexually transmitted diseases are not treated at the clinic.  ° ° °Dental Care: °Organization         Address  Phone  Notes  °[place] [facility] [address], [place] (336) 641-6152 Accepts children up to age 21 who are enrolled in Medicaid or [place] Health Choice; pregnant women with a Medicaid card; and children who have applied for Medicaid or [place] Health Choice, but were declined, whose parents can pay a reduced fee at time of service.  °[place]  [facility] [place]  [address], [place] (336) 641-7733 Accepts children up to age 21 who are enrolled in Medicaid or [place] Health Choice; pregnant women with a Medicaid card; and children who have applied for Medicaid or [place] Health Choice, but were declined, whose parents can pay a reduced fee at time of service.  °[facility] ° [address], [place] (336) 641-4533 Patients are seen by appointment only. Walk-ins are not accepted. [facility] will see patients 18 years of age and older. °Monday - Tuesday (8am-5pm) °Most Wednesdays (8:30-5pm) °$30 per visit, cash only  °[facility] ° [address], [place] (336) 641-4533 Patients are seen by appointment only. Walk-ins are not accepted. [facility] will see patients 18 years of age and older. °One   Wednesday Evening (Monthly: Volunteer Based).  $30 per visit, cash only  °[facility]  (919) [phone] for adults; Children under age 4, call Graduate Pediatric Dentistry at ([phone]) [phone]. Children aged 4-14, please call ([phone]) [phone] to request a pediatric application. ° Dental services are provided in all areas of dental care including fillings, crowns and bridges, complete and partial dentures, implants, gum treatment, root canals, and extractions. Preventive care is also provided. Treatment is provided to both adults and children. °Patients are selected via a lottery and there is often a waiting list. °  °[facility] [address], °[place] ° (336) 763-8833 [email or web address] °  °[facility] [address], [place], [place] (336)723-1848, Ext. 123 Second and Fourth Thursday of each month, opens at 6:30 AM; Clinic ends at 9 AM.  Patients are seen on a first-come first-served basis, and a limited number are seen during each clinic.  ° °[facility] ° [address], [place], [place] (336) 723-7904    Eligibility Requirements °You must have lived in [place], [place], or [place] counties for at least the last three months. °  You cannot be eligible for state or federal sponsored healthcare insurance, including Veterans Administration, Medicaid, or Medicare. °  You generally cannot be eligible for healthcare insurance through your employer.  °  How to apply: °Eligibility screenings are held every Tuesday and Wednesday afternoon from 1:00 pm until 4:00 pm. You do not need an appointment for the interview!  °[facility] [address], [place], [place] 336-631-2330   °[facility]  336-342-8273   °[facility]  336-703-3100   °[facility]  336-570-6415   ° °Behavioral Health Resources in the Community: °Intensive Outpatient Programs °Organization         Address  Phone  Notes  °[facility] [address], [place], [place] 336-878-6098   °[facility] [address], [place], [place] 336-832-9800   °[facility] [address], [place], [place] ° 336-882-2125   °[facility] [address],  °[place], [place] 1-800-853-5163 or 336-641-4981   °Substance Abuse Resources °Organization         Address  Phone  Notes  °[facility]  336-882-2125   °[facility]  336-784-9470   °[facility]  336-285-9073   °[facility]  336-845-3988   °Residential & Outpatient Substance Abuse Program  1-800-659-3381   °Psychological Services °Organization         Address  Phone  Notes  °[facility]  336- 832-9600   °[facility]  336- 378-7881   °[facility] [address], [place] 1-800-853-5163 or 336-641-4981   ° °Mobile Crisis Teams °Organization         Address  Phone  Notes  °[facility], Mobile Crisis Care Unit  1-877-626-1772   °Assertive °[facility] ° [address]  [place], [place] 336-834-9664   °Sharon DeEsch [address] °[place] 336-554-5454   ° °Self-Help/Support Groups °Organization         Address  Phone             Notes  °[facility]  - variety of support groups  336- 373-1402 Call for more information  °Narcotics Anonymous (NA), [facility] [address], °[place]  2 meetings at this location  ° °  Residential Treatment Programs Organization         Address  Phone  Notes  [facility] [address],    [place]  [phone]   [facility]  [address], [place] [phone], [place], [place] [phone]   [facility] [address], [place] [phone] Admissions: 8am-3pm M-F  [facility] [address]    [place], [place] [phone]   [facility] [address], [place], [place] [phone]   [facility] [address]  [place], [place] [phone]   [facility] Intensive Outpatient [address], [place], [place] [phone]   [facility] ([facility] [address]  [place], [place] [phone] or [phone]   [facility] ([facility]) [address] [place], [place] [phone] Accepts Medicaid  [facility] [address]  [place] [phone] Substance Abuse/Addiction Treatment   [facility]         Address  Phone  Notes  [facility]  (438)096-2925(888) [phone]   Angie FavaJulie Brannon, PhD [address], [address] [place], [place]   (251) [phone](336) [phone] or [phone](336) [phone]   [facility]   [address] [place], [place] ([phone](336) [phone]   [facility] [address], [place], [place] [phone](336) [phone] Insurance/Medicaid/sponsorship through [facility] [address]                                    [place], [place] [phone](336) [phone] Therapy/tele-psych/case    [facility] [address]   [place], [place] [phone]) [phone]    Dr. Lolly MustacheArfeen  [phone](336) [phone]   [facility]  [employer] [facility] 1) [address], [place] 2) [address], [place] 3)  [address], [place] [phone](336) [phone](336) [phone]  [phone](336) [phone]   [facility] Child Abuse Hotline 346-615-8121(336) (201)523-3149 or 6784821065(336) [phone] (After Hours)      Take the prescriptions as directed. Your liver function tests were elevated today. Avoid alcohol and tylenol products until you are seen in follow up by your regular medical doctor.  Call your regular medical doctor and the OB/GYN doctor tomorrow to schedule a follow up appointment within the next week.  Return to the Emergency Department immediately sooner if worsening.

## 2014-08-31 NOTE — ED Notes (Signed)
Pt not in room. Pt left.

## 2014-08-31 NOTE — ED Notes (Signed)
Pt reports generalized abdominal pain for the last several days. Pt reports has not had a period in four months. Pt reports nausea but denies any v/d.

## 2014-09-01 LAB — GC/CHLAMYDIA PROBE AMP (~~LOC~~) NOT AT ARMC
CHLAMYDIA, DNA PROBE: NEGATIVE
Neisseria Gonorrhea: NEGATIVE

## 2014-12-28 ENCOUNTER — Emergency Department (HOSPITAL_COMMUNITY)
Admission: EM | Admit: 2014-12-28 | Discharge: 2014-12-28 | Disposition: A | Discharge status: Home / Self Care | Payer: Self-pay | Attending: Emergency Medicine | Admitting: Emergency Medicine

## 2014-12-28 ENCOUNTER — Encounter (HOSPITAL_COMMUNITY): Payer: Self-pay

## 2014-12-28 DIAGNOSIS — Z79899 Other long term (current) drug therapy: Secondary | ICD-10-CM | POA: Insufficient documentation

## 2014-12-28 DIAGNOSIS — K047 Periapical abscess without sinus: Secondary | ICD-10-CM

## 2014-12-28 DIAGNOSIS — F329 Major depressive disorder, single episode, unspecified: Secondary | ICD-10-CM | POA: Insufficient documentation

## 2014-12-28 DIAGNOSIS — Z720 Tobacco use: Secondary | ICD-10-CM | POA: Insufficient documentation

## 2014-12-28 DIAGNOSIS — F419 Anxiety disorder, unspecified: Secondary | ICD-10-CM | POA: Insufficient documentation

## 2014-12-28 DIAGNOSIS — F909 Attention-deficit hyperactivity disorder, unspecified type: Secondary | ICD-10-CM | POA: Insufficient documentation

## 2014-12-28 DIAGNOSIS — K029 Dental caries, unspecified: Secondary | ICD-10-CM | POA: Insufficient documentation

## 2014-12-28 DIAGNOSIS — Z792 Long term (current) use of antibiotics: Secondary | ICD-10-CM | POA: Insufficient documentation

## 2014-12-28 MED ORDER — HYDROCODONE-ACETAMINOPHEN 5-325 MG PO TABS: 1.0000 | ORAL_TABLET | ORAL | Status: DC | PRN

## 2014-12-28 MED ORDER — AMOXICILLIN 500 MG PO CAPS: 500.0000 mg | ORAL_CAPSULE | Freq: Three times a day (TID) | ORAL | Status: DC

## 2014-12-28 NOTE — Discharge Instructions (Signed)
Dental Caries °Dental caries is tooth decay. This decay can cause a hole in teeth (cavity) that can get bigger and deeper over time. °HOME CARE °· Brush and floss your teeth. Do this at least two times a day. °· Use a fluoride toothpaste. °· Use a mouth rinse if told by your dentist or doctor. °· Eat less sugary and starchy foods. Drink less sugary drinks. °· Avoid snacking often on sugary and starchy foods. Avoid sipping often on sugary drinks. °· Keep regular checkups and cleanings with your dentist. °· Use fluoride supplements if told by your dentist or doctor. °· Allow fluoride to be applied to teeth if told by your dentist or doctor. °Document Released: 05/13/2008 Document Revised: 12/19/2013 Document Reviewed: 08/06/2012 °ExitCare® Patient Information ©2015 [employer]. This information is not intended to replace advice given to you by your health care provider. Make sure you discuss any questions you have with your health care provider. ° °Dental Pain °A tooth ache may be caused by cavities (tooth decay). Cavities expose the nerve of the tooth to air and hot or cold temperatures. It may come from an infection or abscess (also called a boil or furuncle) around your tooth. It is also often caused by dental caries (tooth decay). This causes the pain you are having. °DIAGNOSIS  °Your caregiver can diagnose this problem by exam. °TREATMENT  °· If caused by an infection, it may be treated with medications which kill germs (antibiotics) and pain medications as prescribed by your caregiver. Take medications as directed. °· Only take over-the-counter or prescription medicines for pain, discomfort, or fever as directed by your caregiver. °· Whether the tooth ache today is caused by infection or dental disease, you should see your dentist as soon as possible for further care. °SEEK MEDICAL CARE IF: °The exam and treatment you received today has been provided on an emergency basis only. This is not a substitute for  complete medical or dental care. If your problem worsens or new problems (symptoms) appear, and you are unable to meet with your dentist, call or return to this location. °SEEK IMMEDIATE MEDICAL CARE IF:  °· You have a fever. °· You develop redness and swelling of your face, jaw, or neck. °· You are unable to open your mouth. °· You have severe pain uncontrolled by pain medicine. °MAKE SURE YOU:  °· Understand these instructions. °· Will watch your condition. °· Will get help right away if you are not doing well or get worse. °Document Released: 08/04/2005 Document Revised: 10/27/2011 Document Reviewed: 03/22/2008 °ExitCare® Patient Information ©2015 [employer]. This information is not intended to replace advice given to you by your health care provider. Make sure you discuss any questions you have with your health care provider. ° °Dental Care and Dentist Visits °Dental care supports good overall health. Regular dental visits can also help you avoid dental pain, bleeding, infection, and other more serious health problems in the future. It is important to keep the mouth healthy because diseases in the teeth, gums, and other oral tissues can spread to other areas of the body. Some problems, such as diabetes, heart disease, and pre-term labor have been associated with poor oral health.  °See your dentist every 6 months. If you experience emergency problems such as a toothache or broken tooth, go to the dentist right away. If you see your dentist regularly, you may catch problems early. It is easier to be treated for problems in the early stages.  °WHAT TO EXPECT   AT A DENTIST VISIT  °Your dentist will look for many common oral health problems and recommend proper treatment. At your regular dental visit, you can expect: °· Gentle cleaning of the teeth and gums. This includes scraping and polishing. This helps to remove the sticky substance around the teeth and gums (plaque). Plaque forms in the mouth shortly after  eating. Over time, plaque hardens on the teeth as tartar. If tartar is not removed regularly, it can cause problems. Cleaning also helps remove stains. °· Periodic X-rays. These pictures of the teeth and supporting bone will help your dentist assess the health of your teeth. °· Periodic fluoride treatments. Fluoride is a natural mineral shown to help strengthen teeth. Fluoride treatment involves applying a fluoride gel or varnish to the teeth. It is most commonly done in children. °· Examination of the mouth, tongue, jaws, teeth, and gums to look for any oral health problems, such as: °¨ Cavities (dental caries). This is decay on the tooth caused by plaque, sugar, and acid in the mouth. It is best to catch a cavity when it is small. °¨ Inflammation of the gums caused by plaque buildup (gingivitis). °¨ Problems with the mouth or malformed or misaligned teeth. °¨ Oral cancer or other diseases of the soft tissues or jaws.  °KEEP YOUR TEETH AND GUMS HEALTHY °For healthy teeth and gums, follow these general guidelines as well as your dentist's specific advice: °· Have your teeth professionally cleaned at the dentist every 6 months. °· Brush twice daily with a fluoride toothpaste. °· Floss your teeth daily.  °· Ask your dentist if you need fluoride supplements, treatments, or fluoride toothpaste. °· Eat a healthy diet. Reduce foods and drinks with added sugar. °· Avoid smoking. °TREATMENT FOR ORAL HEALTH PROBLEMS °If you have oral health problems, treatment varies depending on the conditions present in your teeth and gums. °· Your caregiver will most likely recommend good oral hygiene at each visit. °· For cavities, gingivitis, or other oral health disease, your caregiver will perform a procedure to treat the problem. This is typically done at a separate appointment. Sometimes your caregiver will refer you to another dental specialist for specific tooth problems or for surgery. °SEEK IMMEDIATE DENTAL CARE IF: °· You have  pain, bleeding, or soreness in the gum, tooth, jaw, or mouth area. °· A permanent tooth becomes loose or separated from the gum socket. °· You experience a blow or injury to the mouth or jaw area. °Document Released: 04/16/2011 Document Revised: 10/27/2011 Document Reviewed: 04/16/2011 °ExitCare® Patient Information ©2015 [employer]. This information is not intended to replace advice given to you by your health care provider. Make sure you discuss any questions you have with your health care provider. ° °

## 2014-12-28 NOTE — ED Provider Notes (Signed)
CSN: [id number]     Arrival date & time 12/28/14  2035 History   First MD Initiated Contact with Patient 12/28/14 2106     Chief Complaint  Patient presents with  . Dental Pain     (Consider location/radiation/quality/duration/timing/severity/associated sxs/prior Treatment) Patient is a 39 y.o. female presenting with tooth pain. The history is provided by the patient.  Dental Pain Location:  Upper and lower Upper teeth location:  2/RU 2nd molar Lower teeth location:  22/LL cuspid Quality:  Throbbing Severity:  Severe Onset quality:  Gradual Duration:  5 days Progression:  Worsening Chronicity:  New Context: dental caries   Relieved by:  Nothing Worsened by:  Cold food/drink and pressure Ineffective treatments:  NSAIDs Associated symptoms: facial pain    Amanda Carrillo is a 39 y.o. female who presents to the ED with dental pain. She states she has one upper and one lower that is hurting. She states she has an appointment with a dentis next week. She had dental work done in December and is scheduled for more.   Past Medical History  Diagnosis Date  . Anxiety   . Depression   . ADHD (attention deficit hyperactivity disorder)   . Polysubstance abuse     herion, methadone, etoh   Past Surgical History  Procedure Laterality Date  . Cholecystectomy    . Tubal ligation    . Tubal ligation     Family History  Problem Relation Age of Onset  . Pneumonia Other    History  Substance Use Topics  . Smoking status: Current Every Day Smoker -- 1.00 packs/day    Types: Cigarettes  . Smokeless tobacco: Never Used  . Alcohol Use: Yes     Comment: wine, liquor   OB History    Gravida Para Term Preterm AB TAB SAB Ectopic Multiple Living   3 2 2  1 1          Review of Systems Negative except as stated in HPI   Allergies  Review of patient's allergies indicates no known allergies.  Home Medications   Prior to Admission medications   Medication Sig Start Date End Date  Taking? Authorizing Provider  ibuprofen (ADVIL,MOTRIN) 200 MG tablet Take 400 mg by mouth every 6 (six) hours as needed (dental pain).    Yes Historical Provider, MD  Multiple Vitamin (MULTIVITAMIN WITH MINERALS) TABS tablet Take 1 tablet by mouth daily.   Yes Historical Provider, MD  sertraline (ZOLOFT) 100 MG tablet Take 100 mg by mouth daily. 02/07/14 02/07/15 Yes Historical Provider, MD  acetaminophen-codeine (TYLENOL #3) 300-30 MG per tablet Take 1-2 tablets by mouth every 6 (six) hours as needed for moderate pain. Patient not taking: Reported on 08/31/2014 12/29/13   Ivery QualeHobson Bryant, PA-C  amoxicillin (AMOXIL) 500 MG capsule Take 1 capsule (500 mg total) by mouth 3 (three) times daily. 12/28/14   Hope Orlene OchM Neese, NP  HYDROcodone-acetaminophen (NORCO/VICODIN) 5-325 MG per tablet Take 1 tablet by mouth every 4 (four) hours as needed. 12/28/14   Hope Orlene OchM Neese, NP  meloxicam (MOBIC) 7.5 MG tablet 1 po bid with food Patient not taking: Reported on 08/31/2014 12/29/13   Ivery QualeHobson Bryant, PA-C  metroNIDAZOLE (FLAGYL) 500 MG tablet Take 1 tablet (500 mg total) by mouth 2 (two) times daily. Patient not taking: Reported on 12/28/2014 08/31/14   Samuel JesterKathleen McManus, DO  naproxen (NAPROSYN) 250 MG tablet Take 1 tablet (250 mg total) by mouth 2 (two) times daily as needed for mild pain or moderate  pain (take with food). Patient not taking: Reported on 12/28/2014 08/31/14   Samuel JesterKathleen McManus, DO   BP 128/84 mmHg  Pulse 109  Temp(Src) 99 F (37.2 C) (Oral)  Resp 16  Ht 5\' 10"  (1.778 m)  Wt 160 lb (72.576 kg)  BMI 22.96 kg/m2  SpO2 99%  LMP 12/25/2014 (Exact Date) Physical Exam  Constitutional: She is oriented to person, place, and time. She appears well-developed and well-nourished.  HENT:  Head: Normocephalic.  Right Ear: Tympanic membrane normal.  Left Ear: Tympanic membrane normal.  Mouth/Throat: Uvula is midline and oropharynx is clear and moist.    Dental decay, erythema and swelling of gum surrounding teeth on  the right upper and lower. Tender on exam  Eyes: Conjunctivae and EOM are normal.  Neck: Normal range of motion. Neck supple.  Cardiovascular: Normal rate.   Pulmonary/Chest: Effort normal.  Musculoskeletal: Normal range of motion.  Neurological: She is alert and oriented to person, place, and time. No cranial nerve deficit.  Skin: Skin is warm and dry.  Psychiatric: She has a normal mood and affect. Her behavior is normal.  Nursing note and vitals reviewed.   ED Course  Procedures (including critical care time) Labs Review  MDM  39 y.o. female with dental pain due to carries. Scheduled for dental appointment next week. Stable for d/c and does not appear toxic. Will treat for infection and pain. She will continue to take NSAIDS.   Final diagnoses:  Infected dental caries       Janne NapoleonHope M Neese, NP 12/28/14 2127  Geoffery Lyonsouglas Delo, MD 12/31/14 [phone]

## 2014-12-28 NOTE — ED Notes (Signed)
Pt verbalized understanding of no driving and to use caution within 4 hours of taking pain meds due to meds cause drowsiness 

## 2014-12-28 NOTE — ED Notes (Signed)
I have two teeth that are bothering me on the right side, one is upper and the other is lower. I have an appointment next week with a dentist.

## 2015-03-02 ENCOUNTER — Encounter (HOSPITAL_COMMUNITY): Payer: Self-pay | Admitting: *Deleted

## 2015-03-02 ENCOUNTER — Emergency Department (HOSPITAL_COMMUNITY)
Admission: EM | Admit: 2015-03-02 | Discharge: 2015-03-02 | Disposition: A | Discharge status: Home / Self Care | Payer: Self-pay | Attending: Emergency Medicine | Admitting: Emergency Medicine

## 2015-03-02 DIAGNOSIS — Z720 Tobacco use: Secondary | ICD-10-CM | POA: Insufficient documentation

## 2015-03-02 DIAGNOSIS — F419 Anxiety disorder, unspecified: Secondary | ICD-10-CM | POA: Insufficient documentation

## 2015-03-02 DIAGNOSIS — Z79899 Other long term (current) drug therapy: Secondary | ICD-10-CM | POA: Insufficient documentation

## 2015-03-02 DIAGNOSIS — R519 Headache, unspecified: Secondary | ICD-10-CM

## 2015-03-02 DIAGNOSIS — K2970 Gastritis, unspecified, without bleeding: Secondary | ICD-10-CM | POA: Insufficient documentation

## 2015-03-02 DIAGNOSIS — F329 Major depressive disorder, single episode, unspecified: Secondary | ICD-10-CM | POA: Insufficient documentation

## 2015-03-02 DIAGNOSIS — R51 Headache: Secondary | ICD-10-CM | POA: Insufficient documentation

## 2015-03-02 MED ORDER — SODIUM CHLORIDE 0.9 % IV BOLUS (SEPSIS)
1000.0000 mL | Freq: Once | INTRAVENOUS | Status: AC
  Administered 2015-03-02: 1000 mL via INTRAVENOUS

## 2015-03-02 MED ORDER — KETOROLAC TROMETHAMINE 30 MG/ML IJ SOLN
30.0000 mg | Freq: Once | INTRAMUSCULAR | Status: AC
  Administered 2015-03-02: 30 mg via INTRAVENOUS
  Filled 2015-03-02: qty 1

## 2015-03-02 MED ORDER — ONDANSETRON HCL 4 MG/2ML IJ SOLN
4.0000 mg | Freq: Once | INTRAMUSCULAR | Status: AC
  Administered 2015-03-02: 4 mg via INTRAVENOUS
  Filled 2015-03-02: qty 2

## 2015-03-02 MED ORDER — PROMETHAZINE HCL 25 MG PO TABS: 25.0000 mg | ORAL_TABLET | Freq: Four times a day (QID) | ORAL | Status: DC | PRN

## 2015-03-02 MED ORDER — ONDANSETRON HCL 4 MG/2ML IJ SOLN
4.0000 mg | Freq: Once | INTRAMUSCULAR | Status: AC
  Administered 2015-03-02: 4 mg via INTRAVENOUS

## 2015-03-02 MED ORDER — DIPHENHYDRAMINE HCL 50 MG/ML IJ SOLN
25.0000 mg | Freq: Once | INTRAMUSCULAR | Status: AC
  Administered 2015-03-02: 25 mg via INTRAVENOUS
  Filled 2015-03-02: qty 1

## 2015-03-02 MED ORDER — METOCLOPRAMIDE HCL 5 MG/ML IJ SOLN
10.0000 mg | Freq: Once | INTRAMUSCULAR | Status: AC
Start: 2015-03-02 — End: 2015-03-02
  Administered 2015-03-02: 10 mg via INTRAVENOUS
  Filled 2015-03-02: qty 2

## 2015-03-02 MED ORDER — ONDANSETRON HCL 4 MG/2ML IJ SOLN
4.0000 mg | Freq: Once | INTRAMUSCULAR | Status: DC
  Filled 2015-03-02: qty 2

## 2015-03-02 NOTE — ED Notes (Signed)
Pt reporting improvement in symptoms.  Denies headache, nausea or vomiting at present time.

## 2015-03-02 NOTE — ED Notes (Signed)
Pt states doctor recently put her on medication for depression and bipolar disorder. Meds began 3 days. Vomiting began suddenly this afternoon. Headache began earlier today.

## 2015-03-02 NOTE — Discharge Instructions (Signed)
Stop new medications. Increase fluids. Medication for nausea

## 2015-03-03 NOTE — ED Provider Notes (Signed)
CSN: [id number]     Arrival date & time 03/02/15  1532 History   First MD Initiated Contact with Patient 03/02/15 1625     Chief Complaint  Patient presents with  . Emesis     (Consider location/radiation/quality/duration/timing/severity/associated sxs/prior Treatment) HPI... Headache, nausea, vomiting for several hours. Patient has been on new medications including Epitol and Risperdal for the past 3 days. She has had a history of polysubstance abuse but has been clean for several months. No stiff neck, fever, chills, neurological deficits. She has tried nothing at home. Severity is moderate.  Past Medical History  Diagnosis Date  . Anxiety   . Depression   . ADHD (attention deficit hyperactivity disorder)   . Polysubstance abuse     herion, methadone, etoh   Past Surgical History  Procedure Laterality Date  . Cholecystectomy    . Tubal ligation    . Tubal ligation     Family History  Problem Relation Age of Onset  . Pneumonia Other    History  Substance Use Topics  . Smoking status: Current Every Day Smoker -- 1.00 packs/day    Types: Cigarettes  . Smokeless tobacco: Never Used  . Alcohol Use: Yes     Comment: wine, liquor   OB History    Gravida Para Term Preterm AB TAB SAB Ectopic Multiple Living   Review of Systems  All other systems reviewed and are negative.     Allergies  Review of patient's allergies indicates no known allergies.  Home Medications   Prior to Admission medications   Medication Sig Start Date End Date Taking? Authorizing Provider  carbamazepine (TEGRETOL) 200 MG tablet Take 200 mg by mouth 2 (two) times daily.   Yes Historical Provider, MD  FLUoxetine (PROZAC) 20 MG capsule Take 20 mg by mouth daily.   Yes Historical Provider, MD  ibuprofen (ADVIL,MOTRIN) 200 MG tablet Take 400 mg by mouth every 6 (six) hours as needed (dental pain).    Yes Historical Provider, MD  risperiDONE (RISPERDAL) 0.5 MG tablet Take 0.5 mg  by mouth at bedtime.   Yes Historical Provider, MD  promethazine (PHENERGAN) 25 MG tablet Take 1 tablet (25 mg total) by mouth every 6 (six) hours as needed. 03/02/15   Donnetta Hutching, MD   BP 117/83 mmHg  Pulse 62  Temp(Src) 97.9 F (36.6 C) (Oral)  Resp 18  Ht  (1.778 m)  Wt 198 lb (89.812 kg)  BMI 28.41 kg/m2  SpO2 100%  LMP 02/16/2015 Physical Exam  Constitutional: She is oriented to person, place, and time. She appears well-developed and well-nourished.  HENT:  Head: Normocephalic and atraumatic.  Eyes: Conjunctivae and EOM are normal. Pupils are equal, round, and reactive to light.  Neck: Normal range of motion. Neck supple.  Cardiovascular: Normal rate and regular rhythm.   Pulmonary/Chest: Effort normal and breath sounds normal.  Abdominal: Soft. Bowel sounds are normal.  Musculoskeletal: Normal range of motion.  Neurological: She is alert and oriented to person, place, and time.  Skin: Skin is warm and dry.  Psychiatric: She has a normal mood and affect. Her behavior is normal.  Nursing note and vitals reviewed.   ED Course  Procedures (including critical care time) Labs Review Labs Reviewed - No data to display  Imaging Review No results found.   EKG Interpretation None      MDM   Final diagnoses:  Gastritis  Headache, unspecified headache type    Patient feels much better after IV fluids IV Toradol, Reglan, Benadryl. No neurological deficits. Discharge medications Phenergan 25 mg she has been encouraged to discontinue new medication.      Donnetta HutchingBrian Fuquan Wilson, MD 03/03/15 1620

## 2015-06-19 ENCOUNTER — Encounter (HOSPITAL_COMMUNITY): Payer: Self-pay | Admitting: *Deleted

## 2015-06-19 ENCOUNTER — Inpatient Hospital Stay (HOSPITAL_COMMUNITY)
Admission: AD | Admit: 2015-06-19 | Discharge: 2015-06-20 | Disposition: A | Discharge status: Home / Self Care | Payer: Self-pay | Source: Ambulatory Visit | Attending: Obstetrics & Gynecology | Admitting: Obstetrics & Gynecology

## 2015-06-19 DIAGNOSIS — F17210 Nicotine dependence, cigarettes, uncomplicated: Secondary | ICD-10-CM | POA: Insufficient documentation

## 2015-06-19 DIAGNOSIS — N939 Abnormal uterine and vaginal bleeding, unspecified: Secondary | ICD-10-CM | POA: Insufficient documentation

## 2015-06-19 DIAGNOSIS — N946 Dysmenorrhea, unspecified: Secondary | ICD-10-CM | POA: Insufficient documentation

## 2015-06-19 LAB — CBC
HCT: 39.9 % (ref 36.0–46.0)
Hemoglobin: 13.5 g/dL (ref 12.0–15.0)
MCH: 31.5 pg (ref 26.0–34.0)
MCHC: 33.8 g/dL (ref 30.0–36.0)
MCV: 93 fL (ref 78.0–100.0)
PLATELETS: 170 10*3/uL (ref 150–400)
RBC: 4.29 MIL/uL (ref 3.87–5.11)
RDW: 13.7 % (ref 11.5–15.5)
WBC: 8.9 10*3/uL (ref 4.0–10.5)

## 2015-06-19 LAB — POCT PREGNANCY, URINE: Preg Test, Ur: NEGATIVE

## 2015-06-19 NOTE — MAU Provider Note (Signed)
History     CSN: [id number]  Arrival date and time: 06/19/15 2316   First Provider Initiated Contact with Patient 06/19/15 2332      Chief Complaint  Patient presents with  . Abdominal Pain   HPI Ms. Amanda Carrillo is a 39 y.o. G3P2010 who presents to MAU today with complaint of vaginal bleeding since 06/07/15. The patient states a recent history of irregular periods. She states that last month she had bleeding on 3 different occasions. She states that prior to that she sometimes will skip a month, other times bleeds x 3-10 days. She states that this episode of bleeding has remained heavy. She is passing golf ball sized clots. She is having severe lower abdominal pain associated with the bleeding. She is not taking anything for pain because she states that ibuprofen and tylenol don't work.   OB History    Gravida Para Term Preterm AB TAB SAB Ectopic Multiple Living   Past Medical History  Diagnosis Date  . Anxiety   . Depression   . ADHD (attention deficit hyperactivity disorder)   . Polysubstance abuse     herion, methadone, etoh    Past Surgical History  Procedure Laterality Date  . Cholecystectomy    . Tubal ligation    . Tubal ligation      Family History  Problem Relation Age of Onset  . Pneumonia Other     Social History  Substance Use Topics  . Smoking status: Current Every Day Smoker -- 1.00 packs/day    Types: Cigarettes  . Smokeless tobacco: Never Used  . Alcohol Use: Yes     Comment: wine, liquor    Allergies:  Allergies  Allergen Reactions  . Toradol [Ketorolac Tromethamine] Hives and Nausea And Vomiting    Prescriptions prior to admission  Medication Sig Dispense Refill Last Dose  . carbamazepine (TEGRETOL) 200 MG tablet Take 200 mg by mouth 2 (two) times daily.   03/02/2015 at Unknown time  . FLUoxetine (PROZAC) 20 MG capsule Take 20 mg by mouth daily.   03/02/2015 at Unknown time  . ibuprofen (ADVIL,MOTRIN) 200 MG tablet  Take 400 mg by mouth every 6 (six) hours as needed (dental pain).    unknown  . promethazine (PHENERGAN) 25 MG tablet Take 1 tablet (25 mg total) by mouth every 6 (six) hours as needed. 10 tablet 0   . risperiDONE (RISPERDAL) 0.5 MG tablet Take 0.5 mg by mouth at bedtime.   03/01/2015 at Unknown time    Review of Systems  Constitutional: Negative for fever and malaise/fatigue.  Gastrointestinal: Positive for abdominal pain.  Genitourinary: Negative for dysuria, urgency and frequency.       + vaginal bleeding   Physical Exam   Blood pressure 118/64, pulse 81, temperature 98.7 F (37.1 C), temperature source Oral, resp. rate 16.  Physical Exam  Nursing note and vitals reviewed. Constitutional: She is oriented to person, place, and time. She appears well-developed and well-nourished. No distress.  HENT:  Head: Normocephalic and atraumatic.  Cardiovascular: Normal rate.   Respiratory: Effort normal.  GI: Soft. She exhibits no distension and no mass. There is tenderness (mild to moderate tenderness to palpation of the lower abdomen bilaterally). There is no rebound and no guarding.  Genitourinary: Uterus is tender. Uterus is not enlarged. Cervix exhibits no motion tenderness, no discharge and no friability. Right adnexum displays tenderness. Right adnexum displays no  mass. Left adnexum displays tenderness. Left adnexum displays no mass. There is bleeding (small blood in the vaginal vault) in the vagina. No vaginal discharge found.  Neurological: She is alert and oriented to person, place, and time.  Skin: Skin is warm and dry. No erythema.  Psychiatric: She has a normal mood and affect. Her speech is delayed and slurred. She is slowed.    Results for orders placed or performed during the hospital encounter of 06/19/15 (from the past 24 hour(s))  Urinalysis, Routine w reflex microscopic (not at [facility])     Status: Abnormal   Collection Time: 06/19/15 11:30 PM  Result Value Ref Range   Color,  Urine YELLOW YELLOW   APPearance CLEAR CLEAR   Specific Gravity, Urine <1.005 (L) 1.005 - 1.030   pH 5.5 5.0 - 8.0   Glucose, UA NEGATIVE NEGATIVE mg/dL   Hgb urine dipstick LARGE (A) NEGATIVE   Bilirubin Urine NEGATIVE NEGATIVE   Ketones, ur NEGATIVE NEGATIVE mg/dL   Protein, ur NEGATIVE NEGATIVE mg/dL   Urobilinogen, UA 0.2 0.0 - 1.0 mg/dL   Nitrite NEGATIVE NEGATIVE   Leukocytes, UA NEGATIVE NEGATIVE  Urine rapid drug screen (hosp performed)     Status: Abnormal   Collection Time: 06/19/15 11:30 PM  Result Value Ref Range   Opiates NONE DETECTED NONE DETECTED   Cocaine POSITIVE (A) NONE DETECTED   Benzodiazepines POSITIVE (A) NONE DETECTED   Amphetamines NONE DETECTED NONE DETECTED   Tetrahydrocannabinol NONE DETECTED NONE DETECTED   Barbiturates NONE DETECTED NONE DETECTED  Urine microscopic-add on     Status: Abnormal   Collection Time: 06/19/15 11:30 PM  Result Value Ref Range   Squamous Epithelial / LPF FEW (A) RARE   WBC, UA 0-2 <3 WBC/hpf   RBC / HPF 11-20 <3 RBC/hpf   Bacteria, UA FEW (A) RARE  Pregnancy, urine POC     Status: None   Collection Time: 06/19/15 11:40 PM  Result Value Ref Range   Preg Test, Ur NEGATIVE NEGATIVE  CBC     Status: None   Collection Time: 06/19/15 11:50 PM  Result Value Ref Range   WBC 8.9 4.0 - 10.5 K/uL   RBC 4.29 3.87 - 5.11 MIL/uL   Hemoglobin 13.5 12.0 - 15.0 g/dL   HCT 16.1 09.6 - 04.5 %   MCV 93.0 78.0 - 100.0 fL   MCH 31.5 26.0 - 34.0 pg   MCHC 33.8 30.0 - 36.0 g/dL   RDW 40.9 81.1 - 91.4 %   Platelets 170 150 - 400 K/uL  Wet prep, genital     Status: Abnormal   Collection Time: 06/20/15 12:02 AM  Result Value Ref Range   Yeast Wet Prep HPF POC NONE SEEN NONE SEEN   Trich, Wet Prep NONE SEEN NONE SEEN   Clue Cells Wet Prep HPF POC NONE SEEN NONE SEEN   WBC, Wet Prep HPF POC FEW (A) NONE SEEN    MAU Course  Procedures None  MDM UPT  - negative UA, wet prep, GC/Chlamydia, CBC, HIV and UDS today Reviewed previous  pelvic US, last performed in January 2016 - normal History of polysubstance abuse - UDS performed today. Patient appears intoxicated, is having difficulty keeping her eyes open, speech is slurred and slow.  Will avoid narcotic pain medications at least until UDS is finalized. Patient states an allergy to Toradol.  Patient is hemodynamically stable. Will complete work-up for AUB as outpatient including [place] prior to follow-up with [facility] for endometrial biopsy.  UDS positive for Benzos and Cocaine. Patient is requesting pain medication and claims allergy to Toradol. She does state that she tolerated Ibuprofen. Will advised Ibuprofen PRN for pain at home.  Discussed plan with patient for use of Megace to stop bleeding and Ibuprofen for pain. Patient and BF are upset that we are not giving her stronger pain medication or sleep medication.  Asked patient if she uses other illegal drugs. She states no drug use. +UDS discussed with patient. She continues to deny drug use. Patient offered Ibuprofen prior to discharge, but left without receiving dose. She was not happy and is unlikely to follow-up as scheduled.  Assessment and Plan  A: Abnormal uterine bleeding Dysmenorrhea  P: Discharge home Rx for Megace given to patient 40 mg BID to be continued until follow-up in [facility] Bleeding precautions discussed Scheduled Ibuprofen PRN advised according to package instructions.  Outpatient US ordered. They will call with appointment time prior to [facility] follow-up Patient advised to follow-up with [facility] on 06/27/15 for further evaluation and management of AUB Patient may return to [facility]U as needed or if her condition were to change or worsen   Marny LowensteinJulie N Jolaine Fryberger, PA-C  06/20/2015, 12:59 AM

## 2015-06-19 NOTE — MAU Note (Signed)
[facility] complaining of abdominal pain, pelvic pressure, back pain, and heavy bleeding that started 10/20.  Pt states clots passed are the size of a golf ball.  Pt states she previously had her tubes tided.

## 2015-06-20 ENCOUNTER — Encounter (HOSPITAL_COMMUNITY): Payer: Self-pay

## 2015-06-20 ENCOUNTER — Emergency Department (HOSPITAL_COMMUNITY)
Admission: EM | Admit: 2015-06-20 | Discharge: 2015-06-20 | Disposition: A | Discharge status: Home / Self Care | Payer: Self-pay | Attending: Emergency Medicine | Admitting: Emergency Medicine

## 2015-06-20 DIAGNOSIS — Z720 Tobacco use: Secondary | ICD-10-CM | POA: Insufficient documentation

## 2015-06-20 DIAGNOSIS — R109 Unspecified abdominal pain: Secondary | ICD-10-CM

## 2015-06-20 DIAGNOSIS — N939 Abnormal uterine and vaginal bleeding, unspecified: Secondary | ICD-10-CM

## 2015-06-20 DIAGNOSIS — R1030 Lower abdominal pain, unspecified: Secondary | ICD-10-CM | POA: Insufficient documentation

## 2015-06-20 DIAGNOSIS — F329 Major depressive disorder, single episode, unspecified: Secondary | ICD-10-CM | POA: Insufficient documentation

## 2015-06-20 DIAGNOSIS — R42 Dizziness and giddiness: Secondary | ICD-10-CM | POA: Insufficient documentation

## 2015-06-20 DIAGNOSIS — F419 Anxiety disorder, unspecified: Secondary | ICD-10-CM | POA: Insufficient documentation

## 2015-06-20 DIAGNOSIS — Z79899 Other long term (current) drug therapy: Secondary | ICD-10-CM | POA: Insufficient documentation

## 2015-06-20 LAB — WET PREP, GENITAL
Clue Cells Wet Prep HPF POC: NONE SEEN
Trich, Wet Prep: NONE SEEN
YEAST WET PREP: NONE SEEN

## 2015-06-20 LAB — URINALYSIS, ROUTINE W REFLEX MICROSCOPIC
Bilirubin Urine: NEGATIVE
Glucose, UA: NEGATIVE mg/dL
Ketones, ur: NEGATIVE mg/dL
LEUKOCYTES UA: NEGATIVE
NITRITE: NEGATIVE
PROTEIN: NEGATIVE mg/dL
Specific Gravity, Urine: <1.005 — ABNORMAL LOW (ref 1.005–1.030)
UROBILINOGEN UA: 0.2 mg/dL (ref 0.0–1.0)
pH: 5.5 (ref 5.0–8.0)

## 2015-06-20 LAB — RAPID URINE DRUG SCREEN, HOSP PERFORMED
AMPHETAMINES: NOT DETECTED
BARBITURATES: NOT DETECTED
BENZODIAZEPINES: POSITIVE — AB
Cocaine: POSITIVE — AB
Opiates: NOT DETECTED
TETRAHYDROCANNABINOL: NOT DETECTED

## 2015-06-20 LAB — URINE MICROSCOPIC-ADD ON

## 2015-06-20 LAB — HIV ANTIBODY (ROUTINE TESTING W REFLEX): HIV Screen 4th Generation wRfx: NONREACTIVE

## 2015-06-20 MED ORDER — IBUPROFEN 600 MG PO TABS: 600.0000 mg | ORAL_TABLET | Freq: Once | ORAL | Status: DC

## 2015-06-20 MED ORDER — MEGESTROL ACETATE 20 MG PO TABS: 40.0000 mg | ORAL_TABLET | Freq: Two times a day (BID) | ORAL | Status: DC

## 2015-06-20 MED ORDER — HYDROCODONE-ACETAMINOPHEN 5-325 MG PO TABS
2.0000 | ORAL_TABLET | Freq: Once | ORAL | Status: AC
  Administered 2015-06-20: 2 via ORAL
  Filled 2015-06-20: qty 2

## 2015-06-20 NOTE — ED Notes (Signed)
Patient's boyfriend became angry when informed that she would not get an Rx for Vicodin or percocet for pain.  Patient stated she will be back tomorrow via EMS.

## 2015-06-20 NOTE — ED Notes (Signed)
Pt c/o generalized abd pain with worse pain in lower abd, [facility] and discharged home. Pt states she fell today as well

## 2015-06-20 NOTE — MAU Note (Signed)
Pt requesting taxi voucher at discharge but had two families members meet her at the hospital. Instructed patient to ride home with family. Pt states left upset complaining of care because she was denied narcotic pain medications due to positive urine drug screen.  Offered ibuprofen prior to discharge but patient denied.

## 2015-06-20 NOTE — Discharge Instructions (Signed)
Dysfunctional Uterine Bleeding °Dysfunctional uterine bleeding is abnormal bleeding from the uterus. Dysfunctional uterine bleeding includes: °· A period that comes earlier or later than usual. °· A period that is lighter, heavier, or has blood clots. °· Bleeding between periods. °· Skipping one or more periods. °· Bleeding after sexual intercourse. °· Bleeding after menopause. °HOME CARE INSTRUCTIONS  °Pay attention to any changes in your symptoms. Follow these instructions to help with your condition: °Eating °· Eat well-balanced meals. Include foods that are high in iron, such as liver, meat, shellfish, green leafy vegetables, and eggs. °· If you become constipated: °¨ Drink plenty of water. °¨ Eat fruits and vegetables that are high in water and fiber, such as spinach, carrots, raspberries, apples, and mango. °Medicines °· Take over-the-counter and prescription medicines only as told by your health care provider. °· Do not change medicines without talking with your health care provider. °· Aspirin or medicines that contain aspirin may make the bleeding worse. Do not take those medicines: °¨ During the week before your period. °¨ During your period. °· If you were prescribed iron pills, take them as told by your health care provider. Iron pills help to replace iron that your body loses because of this condition. °Activity °· If you need to change your sanitary pad or tampon more than one time every 2 hours: °¨ Lie in bed with your feet raised (elevated). °¨ Place a cold pack on your lower abdomen. °¨ Rest as much as possible until the bleeding stops or slows down. °· Do not try to lose weight until the bleeding has stopped and your blood iron level is back to normal. °Other Instructions °· For two months, write down: °¨ When your period starts. °¨ When your period ends. °¨ When any abnormal bleeding occurs. °¨ What problems you notice. °· Keep all follow up visits as told by your health care provider. This is  important. °SEEK MEDICAL CARE IF: °· You get light-headed or weak. °· You have nausea and vomiting. °· You cannot eat or drink without vomiting. °· You feel dizzy or have diarrhea while you are taking medicines. °· You are taking birth control pills or hormones, and you want to change them or stop taking them. °SEEK IMMEDIATE MEDICAL CARE IF: °· You develop a fever or chills. °· You need to change your sanitary pad or tampon more than one time per hour. °· Your bleeding becomes heavier, or your flow contains clots more often. °· You develop pain in your abdomen. °· You lose consciousness. °· You develop a rash. °  °This information is not intended to replace advice given to you by your health care provider. Make sure you discuss any questions you have with your health care provider. °  °Document Released: 08/01/2000 Document Revised: 04/25/2015 Document Reviewed: 10/30/2014 °Elsevier Interactive Patient Education ©2016 [employer] ° ° °Dysmenorrhea °Menstrual cramps (dysmenorrhea) are caused by the muscles of the uterus tightening (contracting) during a menstrual period. For some women, this discomfort is merely bothersome. For others, dysmenorrhea can be severe enough to interfere with everyday activities for a few days each month. °Primary dysmenorrhea is menstrual cramps that last a couple of days when you start having menstrual periods or soon after. This often begins after a teenager starts having her period. As a woman gets older or has a baby, the cramps will usually lessen or disappear. Secondary dysmenorrhea begins later in life, lasts longer, and the pain may be stronger than primary dysmenorrhea. The   pain may start before the period and last a few days after the period.  °CAUSES  °Dysmenorrhea is usually caused by an underlying problem, such as: °· The tissue lining the uterus grows outside of the uterus in other areas of the body (endometriosis). °· The endometrial tissue, which normally lines the  uterus, is found in or grows into the muscular walls of the uterus (adenomyosis). °· The pelvic blood vessels are engorged with blood just before the menstrual period (pelvic congestive syndrome). °· Overgrowth of cells (polyps) in the lining of the uterus or cervix. °· Falling down of the uterus (prolapse) because of loose or stretched ligaments. °· Depression. °· Bladder problems, infection, or inflammation. °· Problems with the intestine, a tumor, or irritable bowel syndrome. °· Cancer of the female organs or bladder. °· A severely tipped uterus. °· A very tight opening or closed cervix. °· Noncancerous tumors of the uterus (fibroids). °· Pelvic inflammatory disease (PID). °· Pelvic scarring (adhesions) from a previous surgery. °· Ovarian cyst. °· An intrauterine device (IUD) used for birth control. °RISK FACTORS °You may be at greater risk of dysmenorrhea if: °· You are younger than age 30. °· You started puberty early. °· You have irregular or heavy bleeding. °· You have never given birth. °· You have a family history of this problem. °· You are a smoker. °SIGNS AND SYMPTOMS  °· Cramping or throbbing pain in your lower abdomen. °· Headaches. °· Lower back pain. °· Nausea or vomiting. °· Diarrhea. °· Sweating or dizziness. °· Loose stools. °DIAGNOSIS  °A diagnosis is based on your history, symptoms, physical exam, diagnostic tests, or procedures. Diagnostic tests or procedures may include: °· Blood tests. °· Ultrasonography. °· An examination of the lining of the uterus (dilation and curettage, D&C). °· An examination inside your abdomen or pelvis with a scope (laparoscopy). °· X-rays. °· CT scan. °· MRI. °· An examination inside the bladder with a scope (cystoscopy). °· An examination inside the intestine or stomach with a scope (colonoscopy, gastroscopy). °TREATMENT  °Treatment depends on the cause of the dysmenorrhea. Treatment may include: °· Pain medicine prescribed by your health care provider. °· Birth  control pills or an IUD with progesterone hormone in it. °· Hormone replacement therapy. °· Nonsteroidal anti-inflammatory drugs (NSAIDs). These may help stop the production of prostaglandins. °· Surgery to remove adhesions, endometriosis, ovarian cyst, or fibroids. °· Removal of the uterus (hysterectomy). °· Progesterone shots to stop the menstrual period. °· Cutting the nerves on the sacrum that go to the female organs (presacral neurectomy). °· Electric current to the sacral nerves (sacral nerve stimulation). °· Antidepressant medicine. °· Psychiatric therapy, counseling, or group therapy. °· Exercise and physical therapy. °· Meditation and yoga therapy. °· Acupuncture. °HOME CARE INSTRUCTIONS  °· Only take over-the-counter or prescription medicines as directed by your health care provider. °· Place a heating pad or hot water bottle on your lower back or abdomen. Do not sleep with the heating pad. °· Use aerobic exercises, walking, swimming, biking, and other exercises to help lessen the cramping. °· Massage to the lower back or abdomen may help. °· Stop smoking. °· Avoid alcohol and caffeine. °SEEK MEDICAL CARE IF:  °· Your pain does not get better with medicine. °· You have pain with sexual intercourse. °· Your pain increases and is not controlled with medicines. °· You have abnormal vaginal bleeding with your period. °· You develop nausea or vomiting with your period that is not controlled with medicine. °SEEK IMMEDIATE   MEDICAL CARE IF:  °You pass out.  °  °This information is not intended to replace advice given to you by your health care provider. Make sure you discuss any questions you have with your health care provider. °  °Document Released: 08/04/2005 Document Revised: 04/06/2013 Document Reviewed: 01/20/2013 °Elsevier Interactive Patient Education ©2016 [employer] Inc. ° °

## 2015-06-20 NOTE — ED Provider Notes (Signed)
CSN: [id number]     Arrival date & time 06/20/15  1914 History   First MD Initiated Contact with Patient 06/20/15 1933     Chief Complaint  Patient presents with  . Abdominal Pain     (Consider location/radiation/quality/duration/timing/severity/associated sxs/prior Treatment) HPI Comments: The patient is a 39 year old female, she [facility] where she was evaluated for vaginal bleeding, she has had some form of vaginal bleeding since October 20. This is a persistent problem, it is almost daily, involves passing clots, she has a painful sensation in her bilateral lower pelvis. Of note when she was seen yesterday she was evaluated for this with a pelvic exam and blood work showing that she had a normal hemoglobin and no specific pelvic pathology based on gonorrhea chlamydia testing. She was not given any opiate medications because she had a positive drug screen for both benzodiazepines and cocaine. It was recommended that she take ibuprofen and it was recommended that she take Megace, she has not gotten that medication filled stating that she has been unable to get to the pharmacy. The patient has no change in her symptoms since yesterday. She continues to have small amounts of vaginal bleeding throughout the day, she does report that she was lightheaded at one point while she was in the shower and fell to her knees. She denies any knee pain at this time. She does report that she had a head injury at some point that she cannot tell me exactly how this happened. She is requesting pain medication from the time of arrival to the time of discharge.  Patient is a 39 y.o. female presenting with abdominal pain. The history is provided by the patient.  Abdominal Pain   Past Medical History  Diagnosis Date  . Anxiety   . Depression   . ADHD (attention deficit hyperactivity disorder)   . Polysubstance abuse     herion, methadone, etoh   Past Surgical History  Procedure  Laterality Date  . Cholecystectomy    . Tubal ligation    . Tubal ligation     Family History  Problem Relation Age of Onset  . Pneumonia Other    Social History  Substance Use Topics  . Smoking status: Current Every Day Smoker -- 1.00 packs/day    Types: Cigarettes  . Smokeless tobacco: Never Used  . Alcohol Use: Yes     Comment: wine, liquor   OB History    Gravida Para Term Preterm AB TAB SAB Ectopic Multiple Living   3 2 2  1 1          Review of Systems  Gastrointestinal: Positive for abdominal pain.  All other systems reviewed and are negative.     Allergies  Toradol  Home Medications   Prior to Admission medications   Medication Sig Start Date End Date Taking? Authorizing Provider  carbamazepine (TEGRETOL) 200 MG tablet Take 200 mg by mouth 2 (two) times daily.    Historical Provider, MD  FLUoxetine (PROZAC) 20 MG capsule Take 20 mg by mouth daily.    Historical Provider, MD  ibuprofen (ADVIL,MOTRIN) 200 MG tablet Take 400 mg by mouth every 6 (six) hours as needed (dental pain).     Historical Provider, MD  megestrol (MEGACE) 20 MG tablet Take 2 tablets (40 mg total) by mouth 2 (two) times daily. 06/20/15   Marny LowensteinJulie N Wenzel, PA-C  promethazine (PHENERGAN) 25 MG tablet Take 1 tablet (25 mg total) by mouth every 6 (  six) hours as needed. 03/02/15   Donnetta Hutching, MD  risperiDONE (RISPERDAL) 0.5 MG tablet Take 0.5 mg by mouth at bedtime.    Historical Provider, MD   BP 128/80 mmHg  Pulse 82  Temp(Src) 98.2 F (36.8 C) (Oral)  Resp 18  Ht  (1.753 m)  Wt 180 lb (81.647 kg)  BMI 26.57 kg/m2  SpO2 98%  LMP 05/12/2015 (Approximate) Physical Exam  Constitutional: She appears well-developed and well-nourished. No distress.  HENT:  Head: Normocephalic and atraumatic.  Mouth/Throat: Oropharynx is clear and moist. No oropharyngeal exudate.  Eyes: Conjunctivae and EOM are normal. Pupils are equal, round, and reactive to light. Right eye exhibits no discharge. Left eye  exhibits no discharge. No scleral icterus.  Neck: Normal range of motion. Neck supple. No JVD present. No thyromegaly present.  Cardiovascular: Normal rate, regular rhythm, normal heart sounds and intact distal pulses.  Exam reveals no gallop and no friction rub.   No murmur heard. Pulmonary/Chest: Effort normal and breath sounds normal. No respiratory distress. She has no wheezes. She has no rales.  Abdominal: Soft. Bowel sounds are normal. She exhibits no distension and no mass. There is tenderness ( minimal suprapubic tenderness, no pain in the bilateral lower abdomen, no guarding, no masses.).  Musculoskeletal: Normal range of motion. She exhibits no edema or tenderness.  Lymphadenopathy:    She has no cervical adenopathy.  Neurological: She is alert. Coordination normal.  Skin: Skin is warm and dry. No rash noted. No erythema.  Psychiatric: She has a normal mood and affect. Her behavior is normal.  Nursing note and vitals reviewed.   ED Course  Procedures (including critical care time) Labs Review Labs Reviewed - No data to display  Imaging Review No results found. I have personally reviewed and evaluated these images and lab results as part of my medical decision-making.    MDM   Final diagnoses:  Abdominal pain, unspecified abdominal location  Vaginal bleeding    No tachycardia, no significant tenderness in the adnexa or the lower abdomen, vital signs are totally normal, she has an area on her forehead that is flat, there is no hematoma, it is minimally red, there is no laceration, no cranial nerve abnormalities, no other signs of injury from her supposedly fall. Reviewed labs from yesterday showing no anemia, positive screen for illegal drugs, the patient was confronted about this and seemed to be in agreement that she had used. She was told that under no circumstances would she get prescription medication for opiate medications but that she would need to get her medications  filled. She maintains that she has no transportation to get to the pharmacy, she is aware that she will not get any opiate medications in prescription form. The patient has normal vital signs, I will defer a syncopal dose of pain medication at this time.      Eber Hong, MD 06/20/15 [phone]

## 2015-06-20 NOTE — Discharge Instructions (Signed)

## 2015-06-22 LAB — GC/CHLAMYDIA PROBE AMP (~~LOC~~) NOT AT ARMC
CHLAMYDIA, DNA PROBE: NEGATIVE
Neisseria Gonorrhea: NEGATIVE

## 2015-06-27 ENCOUNTER — Other Ambulatory Visit: Payer: Self-pay | Admitting: Obstetrics & Gynecology

## 2015-07-08 ENCOUNTER — Emergency Department (HOSPITAL_COMMUNITY)
Admission: EM | Admit: 2015-07-08 | Discharge: 2015-07-09 | Disposition: A | Discharge status: Home / Self Care | Payer: Self-pay | Attending: Emergency Medicine | Admitting: Emergency Medicine

## 2015-07-08 ENCOUNTER — Encounter (HOSPITAL_COMMUNITY): Payer: Self-pay | Admitting: *Deleted

## 2015-07-08 DIAGNOSIS — R1030 Lower abdominal pain, unspecified: Secondary | ICD-10-CM | POA: Insufficient documentation

## 2015-07-08 DIAGNOSIS — F329 Major depressive disorder, single episode, unspecified: Secondary | ICD-10-CM | POA: Insufficient documentation

## 2015-07-08 DIAGNOSIS — Z3202 Encounter for pregnancy test, result negative: Secondary | ICD-10-CM | POA: Insufficient documentation

## 2015-07-08 DIAGNOSIS — F1410 Cocaine abuse, uncomplicated: Secondary | ICD-10-CM | POA: Insufficient documentation

## 2015-07-08 DIAGNOSIS — F32A Depression, unspecified: Secondary | ICD-10-CM

## 2015-07-08 DIAGNOSIS — R000 Tachycardia, unspecified: Secondary | ICD-10-CM | POA: Insufficient documentation

## 2015-07-08 DIAGNOSIS — F1310 Sedative, hypnotic or anxiolytic abuse, uncomplicated: Secondary | ICD-10-CM | POA: Insufficient documentation

## 2015-07-08 DIAGNOSIS — F17210 Nicotine dependence, cigarettes, uncomplicated: Secondary | ICD-10-CM | POA: Insufficient documentation

## 2015-07-08 DIAGNOSIS — Z79899 Other long term (current) drug therapy: Secondary | ICD-10-CM | POA: Insufficient documentation

## 2015-07-08 DIAGNOSIS — F419 Anxiety disorder, unspecified: Secondary | ICD-10-CM | POA: Insufficient documentation

## 2015-07-08 NOTE — ED Notes (Signed)
Pt states she wants help with her alcohol dependence and her bipolar; pt states the drinking helps; pt states she has had 3 12 oz beers today; pt denies any illicit drug use; pt is crying and tearful during assessment

## 2015-07-08 NOTE — ED Provider Notes (Signed)
CSN: [id number]     Arrival date & time 07/08/15  2317 History  By signing my name below, I, Amanda Carrillo, attest that this documentation has been prepared under the direction and in the presence of Amanda Albe, MD at 0001. Electronically Signed: Phillis Carrillo, ED Scribe. 07/08/2015. 4:10 AM.   Chief Complaint  Patient presents with  . detox    The history is provided by the patient. No language interpreter was used.  HPI Comments: Amanda Carrillo is a 39 y.o. Female with a hx of anxiety, depression, PTSD, ADHD and polysubstance abuse who presents to the Emergency Department complaining of heart racing, SOB, and crying uncontrollably onset 2 days ago. Pt states "I am having a nervous breakdown." Pt states that she witnessed her boyfriend's mother having a massive stroke. She reports that she is estranged from her biological family and her boyfriend and his mother is her main support system. She states that the family wants her to take care of the boyfriend's mother who has had a stroke, instead of having her go to a nursing home and that is causing her a lot of stress. The mother has hemiparesis. She reports that she is also going through menopause, has pain with using tampons, and having irregular menstrual periods. Pt states that she had 3 12 oz beers earlier today and drank 6 beers a week ago. She states that she uses cocaine and last used 3 days ago. She reports that she uses cocaine once every 6 months. Pt has not been on her medications in 6 months and does not currently see a psychiatrist, because her former doctor, Dr. Evelene Croon, does not take medicaid. She reports lower abdominal pain due to her menstrua period. She denies self injury, SI and HI. Pt is not currently employed.   PCP none Psych was Dr Evelene Croon  Past Medical History  Diagnosis Date  . Anxiety   . Depression   . ADHD (attention deficit hyperactivity disorder)   . Polysubstance abuse     herion, methadone, etoh   Past Surgical  History  Procedure Laterality Date  . Cholecystectomy    . Tubal ligation    . Tubal ligation     Family History  Problem Relation Age of Onset  . Pneumonia Other    Social History  Substance Use Topics  . Smoking status: Current Every Day Smoker -- 1.00 packs/day    Types: Cigarettes  . Smokeless tobacco: Never Used  . Alcohol Use: Yes     Comment: wine, liquor   Unemployed Uses cocaine  OB History    Gravida Para Term Preterm AB TAB SAB Ectopic Multiple Living   Review of Systems  Respiratory: Positive for shortness of breath.   Gastrointestinal: Positive for abdominal pain.  Psychiatric/Behavioral: Negative for suicidal ideas and self-injury.  All other systems reviewed and are negative.  Allergies  Toradol  Home Medications   NONE in 6 months  Prior to Admission medications   Medication Sig Start Date End Date Taking? Authorizing Provider  carbamazepine (TEGRETOL) 200 MG tablet Take 200 mg by mouth 2 (two) times daily.    Historical Provider, MD  FLUoxetine (PROZAC) 20 MG capsule Take 20 mg by mouth daily.    Historical Provider, MD  ibuprofen (ADVIL,MOTRIN) 200 MG tablet Take 400 mg by mouth every 6 (six) hours as needed (dental pain).     Historical Provider, MD  megestrol (  MEGACE) 20 MG tablet Take 2 tablets (40 mg total) by mouth 2 (two) times daily. 06/20/15   Marny Lowenstein, PA-C  promethazine (PHENERGAN) 25 MG tablet Take 1 tablet (25 mg total) by mouth every 6 (six) hours as needed. 03/02/15   Donnetta Hutching, MD  risperiDONE (RISPERDAL) 0.5 MG tablet Take 0.5 mg by mouth at bedtime.    Historical Provider, MD   BP 114/81 mmHg  Pulse 101  Temp(Src) 98.4 F (36.9 C) (Oral)  Resp 13  Ht  (1.753 m)  Wt 189 lb (85.73 kg)  BMI 27.90 kg/m2  SpO2 98%  LMP 05/12/2015 (Approximate)  Vital signs normal except tachycardia  Physical Exam  Constitutional: She is oriented to person, place, and time. She appears well-developed and  well-nourished.  Non-toxic appearance. She does not appear ill. No distress.  HENT:  Head: Normocephalic and atraumatic.  Right Ear: External ear normal.  Left Ear: External ear normal.  Nose: Nose normal. No mucosal edema or rhinorrhea.  Mouth/Throat: Oropharynx is clear and moist and mucous membranes are normal. No dental abscesses or uvula swelling.  Eyes: Conjunctivae and EOM are normal. Pupils are equal, round, and reactive to light.  Neck: Normal range of motion and full passive range of motion without pain. Neck supple.  Cardiovascular: Regular rhythm and normal heart sounds.  Tachycardia present.  Exam reveals no gallop and no friction rub.   No murmur heard. Pulmonary/Chest: Effort normal and breath sounds normal. No respiratory distress. She has no wheezes. She has no rhonchi. She has no rales. She exhibits no tenderness and no crepitus.  Abdominal: Soft. Normal appearance and bowel sounds are normal. She exhibits no distension. There is no tenderness. There is no rebound and no guarding.  Musculoskeletal: Normal range of motion. She exhibits no edema or tenderness.  Moves all extremities well.   Neurological: She is alert and oriented to person, place, and time. She has normal strength. No cranial nerve deficit.  Skin: Skin is warm, dry and intact. No rash noted. No erythema. No pallor.  Psychiatric: She has a normal mood and affect. Her speech is normal and behavior is normal. Her mood appears not anxious.  Nursing note and vitals reviewed.   ED Course  Procedures (including critical care time)  Medications  hydrOXYzine (ATARAX/VISTARIL) tablet 50 mg (50 mg Oral Given 07/09/15 0253)     DIAGNOSTIC STUDIES: Oxygen Saturation is 98% on RA, normal by my interpretation.    COORDINATION OF CARE: 12:13 AM-Discussed treatment plan which includes labs and consult with behavioral health with pt at bedside and pt agreed to plan.   02:08 [place], TSS has evaluated patient, states she  does not meet inpatient criteria, she can follow up at [facility]. No medication recommendations, she should have [facility] decide her medications.   Labs Review Results for orders placed or performed during the hospital encounter of 07/08/15  Ethanol  Result Value Ref Range   Alcohol, Ethyl (B) 40 (H) <5 mg/dL  Comprehensive metabolic panel  Result Value Ref Range   Sodium 135 135 - 145 mmol/L   Potassium 3.4 (L) 3.5 - 5.1 mmol/L   Chloride 104 101 - 111 mmol/L   CO2 23 22 - 32 mmol/L   Glucose, Bld 91 65 - 99 mg/dL   BUN 8 6 - 20 mg/dL   Creatinine, Ser 1.61 0.44 - 1.00 mg/dL   Calcium 8.9 8.9 - 09.6 mg/dL   Total Protein 7.6 6.5 - 8.1 g/dL   Albumin  3.9 3.5 - 5.0 g/dL   AST 409208 (H) 15 - 41 U/L   ALT 307 (H) 14 - 54 U/L   Alkaline Phosphatase 72 38 - 126 U/L   Total Bilirubin 1.1 0.3 - 1.2 mg/dL   GFR calc non Af Amer >60 >60 mL/min   GFR calc Af Amer >60 >60 mL/min   Anion gap 8 5 - 15  CBC with Differential  Result Value Ref Range   WBC 7.9 4.0 - 10.5 K/uL   RBC 4.21 3.87 - 5.11 MIL/uL   Hemoglobin 13.5 12.0 - 15.0 g/dL   HCT 81.139.6 91.436.0 - 78.246.0 %   MCV 94.1 78.0 - 100.0 fL   MCH 32.1 26.0 - 34.0 pg   MCHC 34.1 30.0 - 36.0 g/dL   RDW 95.612.7 21.311.5 - 08.615.5 %   Platelets 177 150 - 400 K/uL   Neutrophils Relative % 60 %   Neutro Abs 4.7 1.7 - 7.7 K/uL   Lymphocytes Relative 32 %   Lymphs Abs 2.5 0.7 - 4.0 K/uL   Monocytes Relative 7 %   Monocytes Absolute 0.5 0.1 - 1.0 K/uL   Eosinophils Relative 1 %   Eosinophils Absolute 0.1 0.0 - 0.7 K/uL   Basophils Relative 0 %   Basophils Absolute 0.0 0.0 - 0.1 K/uL  Pregnancy, urine  Result Value Ref Range   Preg Test, Ur NEGATIVE NEGATIVE  Urinalysis, Routine w reflex microscopic  Result Value Ref Range   Color, Urine YELLOW YELLOW   APPearance CLEAR CLEAR   Specific Gravity, Urine <1.005 (L) 1.005 - 1.030   pH 5.5 5.0 - 8.0   Glucose, UA NEGATIVE NEGATIVE mg/dL   Hgb urine dipstick SMALL (A) NEGATIVE   Bilirubin Urine NEGATIVE  NEGATIVE   Ketones, ur NEGATIVE NEGATIVE mg/dL   Protein, ur NEGATIVE NEGATIVE mg/dL   Nitrite POSITIVE (A) NEGATIVE   Leukocytes, UA NEGATIVE NEGATIVE  Urine rapid drug screen (hosp performed)  Result Value Ref Range   Opiates NONE DETECTED NONE DETECTED   Cocaine POSITIVE (A) NONE DETECTED   Benzodiazepines POSITIVE (A) NONE DETECTED   Amphetamines NONE DETECTED NONE DETECTED   Tetrahydrocannabinol NONE DETECTED NONE DETECTED   Barbiturates NONE DETECTED NONE DETECTED  Urine microscopic-add on  Result Value Ref Range   Squamous Epithelial / LPF 0-5 (A) NONE SEEN   WBC, UA 0-5 0 - 5 WBC/hpf   RBC / HPF 0-5 0 - 5 RBC/hpf   Bacteria, UA MANY (A) NONE SEEN   Laboratory interpretation all normal except positive UDS, elevated LFTs, alcohol but not legally intoxicated     MDM   Final diagnoses:  Depression  Cocaine abuse   Plan discharge  Amanda AlbeIva Keisuke Hollabaugh, MD, FACEP   I personally performed the services described in this documentation, which was scribed in my presence. The recorded information has been reviewed and considered.  Amanda AlbeIva Vinaya Sancho, MD, Concha PyoFACEP     Curtina Grills, MD 07/09/15 [phone]

## 2015-07-09 LAB — COMPREHENSIVE METABOLIC PANEL
ALK PHOS: 72 U/L (ref 38–126)
ALT: 307 U/L — AB (ref 14–54)
ANION GAP: 8 (ref 5–15)
AST: 208 U/L — ABNORMAL HIGH (ref 15–41)
Albumin: 3.9 g/dL (ref 3.5–5.0)
BILIRUBIN TOTAL: 1.1 mg/dL (ref 0.3–1.2)
BUN: 8 mg/dL (ref 6–20)
CALCIUM: 8.9 mg/dL (ref 8.9–10.3)
CO2: 23 mmol/L (ref 22–32)
CREATININE: 0.67 mg/dL (ref 0.44–1.00)
Chloride: 104 mmol/L (ref 101–111)
GFR calc non Af Amer: >60 mL/min (ref >60)
GLUCOSE: 91 mg/dL (ref 65–99)
Potassium: 3.4 mmol/L — ABNORMAL LOW (ref 3.5–5.1)
Sodium: 135 mmol/L (ref 135–145)
TOTAL PROTEIN: 7.6 g/dL (ref 6.5–8.1)

## 2015-07-09 LAB — CBC WITH DIFFERENTIAL/PLATELET
BASOS ABS: 0 10*3/uL (ref 0.0–0.1)
BASOS PCT: 0 %
EOS ABS: 0.1 10*3/uL (ref 0.0–0.7)
Eosinophils Relative: 1 %
HEMATOCRIT: 39.6 % (ref 36.0–46.0)
Hemoglobin: 13.5 g/dL (ref 12.0–15.0)
Lymphocytes Relative: 32 %
Lymphs Abs: 2.5 10*3/uL (ref 0.7–4.0)
MCH: 32.1 pg (ref 26.0–34.0)
MCHC: 34.1 g/dL (ref 30.0–36.0)
MCV: 94.1 fL (ref 78.0–100.0)
MONO ABS: 0.5 10*3/uL (ref 0.1–1.0)
Monocytes Relative: 7 %
NEUTROS ABS: 4.7 10*3/uL (ref 1.7–7.7)
Neutrophils Relative %: 60 %
PLATELETS: 177 10*3/uL (ref 150–400)
RBC: 4.21 MIL/uL (ref 3.87–5.11)
RDW: 12.7 % (ref 11.5–15.5)
WBC: 7.9 10*3/uL (ref 4.0–10.5)

## 2015-07-09 LAB — URINALYSIS, ROUTINE W REFLEX MICROSCOPIC
BILIRUBIN URINE: NEGATIVE
Glucose, UA: NEGATIVE mg/dL
KETONES UR: NEGATIVE mg/dL
Leukocytes, UA: NEGATIVE
NITRITE: POSITIVE — AB
PROTEIN: NEGATIVE mg/dL
pH: 5.5 (ref 5.0–8.0)

## 2015-07-09 LAB — ETHANOL: Alcohol, Ethyl (B): 40 mg/dL — ABNORMAL HIGH (ref <5)

## 2015-07-09 LAB — URINE MICROSCOPIC-ADD ON

## 2015-07-09 LAB — RAPID URINE DRUG SCREEN, HOSP PERFORMED
Amphetamines: NOT DETECTED
Barbiturates: NOT DETECTED
Benzodiazepines: POSITIVE — AB
COCAINE: POSITIVE — AB
OPIATES: NOT DETECTED
Tetrahydrocannabinol: NOT DETECTED

## 2015-07-09 LAB — PREGNANCY, URINE: PREG TEST UR: NEGATIVE

## 2015-07-09 MED ORDER — HYDROXYZINE HCL 25 MG PO TABS
50.0000 mg | ORAL_TABLET | Freq: Once | ORAL | Status: AC
  Administered 2015-07-09: 50 mg via ORAL
  Filled 2015-07-09: qty 2

## 2015-07-09 NOTE — Discharge Instructions (Signed)
Call [facility], [phone]  At [address] in [place] to get an appointment to be assessed for your PTSD, Depression, your alcohol and cocaine problem.
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
## 2015-08-24 ENCOUNTER — Emergency Department (HOSPITAL_COMMUNITY)
Admission: EM | Admit: 2015-08-24 | Discharge: 2015-08-24 | Disposition: A | Discharge status: Home / Self Care | Payer: Self-pay | Attending: Physician Assistant | Admitting: Physician Assistant

## 2015-08-24 ENCOUNTER — Encounter (HOSPITAL_COMMUNITY): Payer: Self-pay | Admitting: Emergency Medicine

## 2015-08-24 DIAGNOSIS — F17210 Nicotine dependence, cigarettes, uncomplicated: Secondary | ICD-10-CM | POA: Insufficient documentation

## 2015-08-24 DIAGNOSIS — F909 Attention-deficit hyperactivity disorder, unspecified type: Secondary | ICD-10-CM | POA: Insufficient documentation

## 2015-08-24 DIAGNOSIS — F329 Major depressive disorder, single episode, unspecified: Secondary | ICD-10-CM | POA: Insufficient documentation

## 2015-08-24 DIAGNOSIS — M79672 Pain in left foot: Secondary | ICD-10-CM | POA: Insufficient documentation

## 2015-08-24 DIAGNOSIS — Z79899 Other long term (current) drug therapy: Secondary | ICD-10-CM | POA: Insufficient documentation

## 2015-08-24 DIAGNOSIS — F419 Anxiety disorder, unspecified: Secondary | ICD-10-CM | POA: Insufficient documentation

## 2015-08-24 MED ORDER — OXYCODONE-ACETAMINOPHEN 5-325 MG PO TABS: 1.0000 | ORAL_TABLET | Freq: Four times a day (QID) | ORAL | Status: DC | PRN

## 2015-08-24 NOTE — ED Notes (Signed)
C/o left foot pain x 2 months. No known injury. Had left foot fracture 2 years ago.

## 2015-08-24 NOTE — Discharge Instructions (Signed)
Morton Neuralgia  Morton neuralgia is a type of foot pain in the area closest to your toes. This area is sometimes called the ball of your foot. Morton neuralgia occurs when a branch of a nerve in your foot (digital nerve) becomes compressed.   When this happens over a long period of time, the nerve can thicken (neuroma) and cause pain. This usually occurs between the third and fourth toe. Morton neuralgia can come and go but may get worse over time.   CAUSES  Your digital nerve can become compressed and stretched at a point where it passes under a thick band of tissue that connects your toes (intermetatarsal ligament). Morton neuralgia can be caused by mild repetitive damage in this area. This type of damage can result from:   · Activities such as running or jumping.  · Wearing shoes that are too tight.  RISK FACTORS  You may be at risk for Morton neuralgia if you:  · Are female.  · Wear high heels.  · Wear shoes that are narrow or tight.  · Participate in activities that stretch your toes. These include:  ¨ Running.  ¨ Ballet.  ¨ Long-distance walking.  SIGNS AND SYMPTOMS  The first symptom of Morton neuralgia is pain that spreads from the ball of your foot to your toes. It may feel like you are walking on a marble. Pain usually gets worse with walking and goes away at night. Other symptoms may include numbness and cramping of your toes.  DIAGNOSIS   Your health care provider will do a physical exam. When doing the exam, your health care provider may:   · Squeeze your foot just behind your toe.  · Ask you to move your toes to check for pain.  You may also have tests on your foot to confirm the diagnosis. These may include:   · An X-ray.  · An MRI.  TREATMENT   Treatment for Morton neuralgia may be as simple as changing the kind of shoes you wear. Other treatments may include:  · Wearing a supportive pad (orthosis) under the front of your foot. This lifts your toe bones and takes pressure off the nerve.  · Getting  injections of numbing medicine and anti-inflammatory medicine (steroid) in the nerve.  · Having surgery to remove part of the thickened nerve.  HOME CARE INSTRUCTIONS   · Take medicine only as directed by your health care provider.  · Wear soft-soled shoes with a wide toe area.  · Stop activities that may be causing pain.  · Elevate your foot when resting.  · Massage your foot.  · Apply ice to the injured area:      Put ice in a plastic bag.    Place a towel between your skin and the bag.    Leave the ice on for 20 minutes, 2-3 times a day.    · Keep all follow-up visits as directed by your health care provider. This is important.  SEEK MEDICAL CARE IF:  · Home care instructions are not helping you get better.  · Your symptoms change or get worse.     This information is not intended to replace advice given to you by your health care provider. Make sure you discuss any questions you have with your health care provider.     Document Released: 11/10/2000 Document Revised: 08/25/2014 Document Reviewed: 10/05/2013  Elsevier Interactive Patient Education ©2016 [employer] Inc[employer]

## 2015-08-24 NOTE — ED Provider Notes (Signed)
CSN: [id number]     Arrival date & time 08/24/15  4098 History  By signing my name below, I, Essence Howell, attest that this documentation has been prepared under the direction and in the presence of Roxy Horseman, PA-C Electronically Signed: Charline Bills, ED Scribe 08/24/2015 at 9:55 AM.   Chief Complaint  Patient presents with  . Foot Pain   The history is provided by the patient. No language interpreter was used.   HPI Comments: Amanda Carrillo is a 40 y.o. female who presents to the Emergency Department with a chief complaint of constant, gradually worsening left foot pain onset 3 months ago. No known injury. Pt states that pain is exacerbated with palpation, bending her toes and bearing weight. Pt also reports that pain is more severe at night. She has tried ibuprofen, elevation and heat without significant relief. Pt reports previous left foot fracture 3 years ago.   Past Medical History  Diagnosis Date  . Anxiety   . Depression   . ADHD (attention deficit hyperactivity disorder)   . Polysubstance abuse     herion, methadone, etoh   Past Surgical History  Procedure Laterality Date  . Cholecystectomy    . Tubal ligation    . Tubal ligation     Family History  Problem Relation Age of Onset  . Pneumonia Other    Social History  Substance Use Topics  . Smoking status: Current Every Day Smoker -- 1.00 packs/day    Types: Cigarettes  . Smokeless tobacco: Never Used  . Alcohol Use: Yes     Comment: wine, liquor   OB History    Gravida Para Term Preterm AB TAB SAB Ectopic Multiple Living   3 2 2  1 1          Review of Systems  Constitutional: Negative for fever.  Musculoskeletal: Positive for arthralgias.   Allergies  Toradol  Home Medications   Prior to Admission medications   Medication Sig Start Date End Date Taking? Authorizing Provider  carbamazepine (TEGRETOL) 200 MG tablet Take 200 mg by mouth 2 (two) times daily.    Historical Provider, MD  FLUoxetine  (PROZAC) 20 MG capsule Take 20 mg by mouth daily.    Historical Provider, MD  ibuprofen (ADVIL,MOTRIN) 200 MG tablet Take 400 mg by mouth every 6 (six) hours as needed (dental pain).     Historical Provider, MD  megestrol (MEGACE) 20 MG tablet Take 2 tablets (40 mg total) by mouth 2 (two) times daily. 06/20/15   Marny Lowenstein, PA-C  promethazine (PHENERGAN) 25 MG tablet Take 1 tablet (25 mg total) by mouth every 6 (six) hours as needed. 03/02/15   Donnetta Hutching, MD  risperiDONE (RISPERDAL) 0.5 MG tablet Take 0.5 mg by mouth at bedtime.    Historical Provider, MD   BP 114/74 mmHg  Pulse 102  Temp(Src) 98.1 F (36.7 C) (Oral)  Resp 18  Ht 5\' 9"  (1.753 m)  Wt 205 lb (92.987 kg)  BMI 30.26 kg/m2  SpO2 97% Physical Exam Physical Exam  Constitutional: Pt appears well-developed and well-nourished. No distress.  HENT:  Head: Normocephalic and atraumatic.  Eyes: Conjunctivae are normal.  Neck: Normal range of motion.  Cardiovascular: Normal rate, regular rhythm and intact distal pulses.   Capillary refill < 3 sec  Pulmonary/Chest: Effort normal and breath sounds normal.  Musculoskeletal: Pt exhibits tenderness over the posterior aspect of the L forefoot. No bony abnormality or deformity. Pt exhibits no edema. Positive forefoot compression test.  ROM: 5/5  Neurological: Pt  is alert. Coordination normal.  Sensation 5/5 Strength 5/5  Skin: Skin is warm and dry. Pt is not diaphoretic.  No tenting of the skin . No cellulitis, abscess or sign of infection. Psychiatric: Pt has a normal mood and affect.  Nursing note and vitals reviewed.  ED Course  Procedures (including critical care time) DIAGNOSTIC STUDIES: Oxygen Saturation is 97% on RA, normal by my interpretation.    COORDINATION OF CARE: 9:43 AM-Discussed treatment plan which includes continue ibuprofen and Percocet with pt at bedside and pt agreed to plan.     MDM   Final diagnoses:  Left foot pain    Left foot pain x months.   No injury.  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  Recommend ice, NSAIDs, in-soles, and ortho follow-up if needed.  I personally performed the services described in this documentation, which was scribed in my presence. The recorded information has been reviewed and is accurate.      Roxy Horsemanobert Lashane Whelpley, PA-C 08/24/15 [id number]  Courteney Randall AnLyn Mackuen, MD 08/25/15 [phone]

## 2016-03-06 ENCOUNTER — Emergency Department (HOSPITAL_COMMUNITY): Payer: Self-pay

## 2016-03-06 ENCOUNTER — Encounter (HOSPITAL_COMMUNITY): Payer: Self-pay | Admitting: Emergency Medicine

## 2016-03-06 ENCOUNTER — Emergency Department (HOSPITAL_COMMUNITY)
Admission: EM | Admit: 2016-03-06 | Discharge: 2016-03-06 | Disposition: A | Discharge status: Home / Self Care | Payer: Self-pay | Attending: Emergency Medicine | Admitting: Emergency Medicine

## 2016-03-06 DIAGNOSIS — M7742 Metatarsalgia, left foot: Secondary | ICD-10-CM | POA: Insufficient documentation

## 2016-03-06 DIAGNOSIS — Z791 Long term (current) use of non-steroidal anti-inflammatories (NSAID): Secondary | ICD-10-CM | POA: Insufficient documentation

## 2016-03-06 DIAGNOSIS — F329 Major depressive disorder, single episode, unspecified: Secondary | ICD-10-CM | POA: Insufficient documentation

## 2016-03-06 DIAGNOSIS — F17210 Nicotine dependence, cigarettes, uncomplicated: Secondary | ICD-10-CM | POA: Insufficient documentation

## 2016-03-06 DIAGNOSIS — Z79899 Other long term (current) drug therapy: Secondary | ICD-10-CM | POA: Insufficient documentation

## 2016-03-06 MED ORDER — MELOXICAM 7.5 MG PO TABS: 7.5000 mg | ORAL_TABLET | Freq: Every day | ORAL | Status: DC

## 2016-03-06 NOTE — ED Notes (Signed)
Pt states she has been having left foot pain for over a year.  Reports old fx in this foot.

## 2016-03-06 NOTE — Discharge Instructions (Signed)

## 2016-03-06 NOTE — ED Provider Notes (Signed)
CSN: [id number]     Arrival date & time 03/06/16  1234 History   First MD Initiated Contact with Patient 03/06/16 1256     Chief Complaint  Patient presents with  . Foot Pain     (Consider location/radiation/quality/duration/timing/severity/associated sxs/prior Treatment) The history is provided by the patient.   Amanda Carrillo is a 40 y.o. female presenting with acute on chronic left foot pain which has been present for at least the past year.  She denies any obvious injuries to this foot, other then being very active on her feet.  She has a prior fracture to this foot 3-4 years ago which she never sought follow-up care for.  Her pain is worsened this week.  She denies weakness or numbness distal to the injury site.  There is no edema or erythema.  She has used ibuprofen, elevation and ice without pain relief.  She was seen for this problem at [facility] months ago at which time she was placed on diclofenac which did not relieve her symptoms.    Past Medical History  Diagnosis Date  . Anxiety   . Depression   . ADHD (attention deficit hyperactivity disorder)   . Polysubstance abuse     herion, methadone, etoh   Past Surgical History  Procedure Laterality Date  . Cholecystectomy    . Tubal ligation    . Tubal ligation     Family History  Problem Relation Age of Onset  . Pneumonia Other    Social History  Substance Use Topics  . Smoking status: Current Every Day Smoker -- 1.00 packs/day    Types: Cigarettes  . Smokeless tobacco: Never Used  . Alcohol Use: Yes     Comment: wine, liquor   OB History    Gravida Para Term Preterm AB TAB SAB Ectopic Multiple Living   3 2 2  1 1          Review of Systems  Constitutional: Negative for fever.  Musculoskeletal: Positive for arthralgias. Negative for myalgias and joint swelling.  Neurological: Negative for weakness and numbness.      Allergies  Toradol  Home Medications   Prior to Admission medications   Medication Sig  Start Date End Date Taking? Authorizing Provider  ibuprofen (ADVIL,MOTRIN) 200 MG tablet Take 400 mg by mouth every 6 (six) hours as needed (dental pain).    Yes Historical Provider, MD  risperiDONE (RISPERDAL) 0.5 MG tablet Take 0.5 mg by mouth at bedtime.   Yes Historical Provider, MD  meloxicam (MOBIC) 7.5 MG tablet Take 1-2 tablets (7.5-15 mg total) by mouth daily. 03/06/16   Burgess AmorJulie Christeen Lai, PA-C   BP 137/88 mmHg  Pulse 81  Temp(Src) 98.2 F (36.8 C) (Oral)  Resp 18  Ht 5\' 9"  (1.753 m)  Wt 104.327 kg  BMI 33.95 kg/m2  SpO2 100%  LMP 02/16/2016 Physical Exam  Constitutional: She appears well-developed and well-nourished.  HENT:  Head: Atraumatic.  Neck: Normal range of motion.  Cardiovascular:  Pulses:      Dorsalis pedis pulses are 2+ on the right side, and 2+ on the left side.  Pulses equal bilaterally  Musculoskeletal: She exhibits tenderness.       Feet:  Tender to palpation along the dorsal left foot over the fourth and fifth proximal metatarsals.  There is no edema or erythema.  Skin is intact.  Distal sensation is normal with less than 2 second cap refill in toes.  No palpable deformity.  Neurological: She is alert. She  has normal strength. She displays normal reflexes. No sensory deficit.  Skin: Skin is warm and dry.  Psychiatric: She has a normal mood and affect.    ED Course  Procedures (including critical care time) Labs Review Labs Reviewed - No data to display  Imaging Review Dg Foot Complete Left  03/06/2016  CLINICAL DATA:  Pain for approximately 1 year EXAM: LEFT FOOT - COMPLETE 3+ VIEW COMPARISON:  None. FINDINGS: Frontal, oblique, and lateral views were obtained. There is no acute fracture or dislocation. The joint spaces appear normal. No erosive change. There is a sesamoid adjacent to the first metatarsal which is either bipartite or has had previous fracture with healing. IMPRESSION: Question previous sesamoid fracture with healing versus bipartite sesamoid.  No acute fracture or dislocation. No apparent arthropathy. Electronically Signed   By: Bretta Bang III M.D.   On: 03/06/2016 13:53   I have personally reviewed and evaluated these images and lab results as part of my medical decision-making.   EKG Interpretation None      MDM   Final diagnoses:  Metatarsalgia of left foot    Review of prior chart indicates fracture of right second metatarsal.  No prior left foot fracture which patient endorses as she has not sought medical care outside of our system, agrees with right foot fracture history.   Radiological studies were viewed, interpreted and considered during the medical decision making and disposition process. I agree with radiologists reading.  Results were also discussed with patient.  Chronic foot pain, possibly metatarsalgia with imaging being negative.  She was placed on meloxicam, advised activity as tolerated.  Referral given for pcp and podiatry prn.     Burgess Amor, PA-C 03/06/16 1429  Mancel Bale, MD 03/06/16 1539

## 2017-01-06 ENCOUNTER — Emergency Department (HOSPITAL_COMMUNITY): Payer: Self-pay

## 2017-01-06 ENCOUNTER — Encounter (HOSPITAL_COMMUNITY): Payer: Self-pay | Admitting: Emergency Medicine

## 2017-01-06 ENCOUNTER — Inpatient Hospital Stay (HOSPITAL_COMMUNITY)
Admission: EM | Admit: 2017-01-06 | Discharge: 2017-01-12 | DRG: 390 | Disposition: A | Discharge status: Home / Self Care | Payer: Self-pay | Attending: Internal Medicine | Admitting: Internal Medicine

## 2017-01-06 DIAGNOSIS — E876 Hypokalemia: Secondary | ICD-10-CM

## 2017-01-06 DIAGNOSIS — R140 Abdominal distension (gaseous): Secondary | ICD-10-CM

## 2017-01-06 DIAGNOSIS — R1110 Vomiting, unspecified: Secondary | ICD-10-CM

## 2017-01-06 DIAGNOSIS — K2900 Acute gastritis without bleeding: Secondary | ICD-10-CM | POA: Diagnosis present

## 2017-01-06 DIAGNOSIS — F419 Anxiety disorder, unspecified: Secondary | ICD-10-CM | POA: Diagnosis present

## 2017-01-06 DIAGNOSIS — F17210 Nicotine dependence, cigarettes, uncomplicated: Secondary | ICD-10-CM | POA: Diagnosis present

## 2017-01-06 DIAGNOSIS — Z23 Encounter for immunization: Secondary | ICD-10-CM

## 2017-01-06 DIAGNOSIS — Z886 Allergy status to analgesic agent status: Secondary | ICD-10-CM

## 2017-01-06 DIAGNOSIS — F1490 Cocaine use, unspecified, uncomplicated: Secondary | ICD-10-CM | POA: Diagnosis present

## 2017-01-06 DIAGNOSIS — K56600 Partial intestinal obstruction, unspecified as to cause: Principal | ICD-10-CM | POA: Diagnosis present

## 2017-01-06 DIAGNOSIS — F909 Attention-deficit hyperactivity disorder, unspecified type: Secondary | ICD-10-CM | POA: Diagnosis present

## 2017-01-06 DIAGNOSIS — K3184 Gastroparesis: Secondary | ICD-10-CM | POA: Diagnosis present

## 2017-01-06 DIAGNOSIS — F329 Major depressive disorder, single episode, unspecified: Secondary | ICD-10-CM | POA: Diagnosis present

## 2017-01-06 DIAGNOSIS — G43A1 Cyclical vomiting, intractable: Secondary | ICD-10-CM

## 2017-01-06 DIAGNOSIS — F32A Depression, unspecified: Secondary | ICD-10-CM | POA: Diagnosis present

## 2017-01-06 DIAGNOSIS — G43A0 Cyclical vomiting, not intractable: Secondary | ICD-10-CM | POA: Diagnosis present

## 2017-01-06 DIAGNOSIS — Z79899 Other long term (current) drug therapy: Secondary | ICD-10-CM

## 2017-01-06 DIAGNOSIS — K30 Functional dyspepsia: Secondary | ICD-10-CM | POA: Diagnosis present

## 2017-01-06 DIAGNOSIS — D72829 Elevated white blood cell count, unspecified: Secondary | ICD-10-CM | POA: Diagnosis present

## 2017-01-06 DIAGNOSIS — F1290 Cannabis use, unspecified, uncomplicated: Secondary | ICD-10-CM | POA: Diagnosis present

## 2017-01-06 LAB — URINALYSIS, ROUTINE W REFLEX MICROSCOPIC
BILIRUBIN URINE: NEGATIVE
GLUCOSE, UA: NEGATIVE mg/dL
HGB URINE DIPSTICK: NEGATIVE
KETONES UR: NEGATIVE mg/dL
NITRITE: NEGATIVE
PH: 6 (ref 5.0–8.0)
Protein, ur: NEGATIVE mg/dL

## 2017-01-06 LAB — COMPREHENSIVE METABOLIC PANEL
ALT: 33 U/L (ref 14–54)
AST: 26 U/L (ref 15–41)
Albumin: 4.7 g/dL (ref 3.5–5.0)
Alkaline Phosphatase: 64 U/L (ref 38–126)
Anion gap: 14 (ref 5–15)
BUN: 14 mg/dL (ref 6–20)
CHLORIDE: 101 mmol/L (ref 101–111)
CO2: 22 mmol/L (ref 22–32)
Calcium: 9.9 mg/dL (ref 8.9–10.3)
Creatinine, Ser: 0.8 mg/dL (ref 0.44–1.00)
GFR calc Af Amer: >60 mL/min (ref >60)
Glucose, Bld: 127 mg/dL — ABNORMAL HIGH (ref 65–99)
Potassium: 3.5 mmol/L (ref 3.5–5.1)
Sodium: 137 mmol/L (ref 135–145)
Total Bilirubin: 1.2 mg/dL (ref 0.3–1.2)
Total Protein: 9 g/dL — ABNORMAL HIGH (ref 6.5–8.1)

## 2017-01-06 LAB — CBC
HCT: 47.6 % — ABNORMAL HIGH (ref 36.0–46.0)
Hemoglobin: 17 g/dL — ABNORMAL HIGH (ref 12.0–15.0)
MCH: 33.4 pg (ref 26.0–34.0)
MCHC: 35.7 g/dL (ref 30.0–36.0)
MCV: 93.5 fL (ref 78.0–100.0)
Platelets: 232 10*3/uL (ref 150–400)
RBC: 5.09 MIL/uL (ref 3.87–5.11)
RDW: 12.8 % (ref 11.5–15.5)
WBC: 20 10*3/uL — AB (ref 4.0–10.5)

## 2017-01-06 LAB — MAGNESIUM: MAGNESIUM: 2 mg/dL (ref 1.7–2.4)

## 2017-01-06 LAB — LIPASE, BLOOD: LIPASE: 25 U/L (ref 11–51)

## 2017-01-06 LAB — PREGNANCY, URINE: Preg Test, Ur: NEGATIVE

## 2017-01-06 MED ORDER — POTASSIUM CHLORIDE IN NACL 20-0.9 MEQ/L-% IV SOLN
INTRAVENOUS | Status: DC
  Administered 2017-01-06: 23:00:00 via INTRAVENOUS

## 2017-01-06 MED ORDER — ONDANSETRON 4 MG PO TBDP
4.0000 mg | ORAL_TABLET | Freq: Once | ORAL | Status: AC | PRN
  Administered 2017-01-06: 4 mg via ORAL

## 2017-01-06 MED ORDER — PNEUMOCOCCAL VAC POLYVALENT 25 MCG/0.5ML IJ INJ
0.5000 mL | INJECTION | INTRAMUSCULAR | Status: AC
  Administered 2017-01-07: 0.5 mL via INTRAMUSCULAR
  Filled 2017-01-06: qty 0.5

## 2017-01-06 MED ORDER — SODIUM CHLORIDE 0.9 % IV SOLN: INTRAVENOUS | Status: DC

## 2017-01-06 MED ORDER — RISPERIDONE 0.5 MG PO TABS
0.5000 mg | ORAL_TABLET | Freq: Every day | ORAL | Status: DC
  Administered 2017-01-06 – 2017-01-11 (×6): 0.5 mg via ORAL
  Filled 2017-01-06 (×5): qty 1

## 2017-01-06 MED ORDER — ONDANSETRON HCL 4 MG/2ML IJ SOLN
4.0000 mg | Freq: Four times a day (QID) | INTRAMUSCULAR | Status: DC | PRN
  Administered 2017-01-07 – 2017-01-12 (×15): 4 mg via INTRAVENOUS
  Filled 2017-01-06 (×15): qty 2

## 2017-01-06 MED ORDER — ONDANSETRON HCL 4 MG PO TABS
4.0000 mg | ORAL_TABLET | Freq: Four times a day (QID) | ORAL | Status: DC | PRN
  Filled 2017-01-06: qty 1

## 2017-01-06 MED ORDER — HYDROMORPHONE HCL 1 MG/ML IJ SOLN
1.0000 mg | Freq: Once | INTRAMUSCULAR | Status: AC
  Administered 2017-01-06: 1 mg via INTRAVENOUS
  Filled 2017-01-06: qty 1

## 2017-01-06 MED ORDER — SODIUM CHLORIDE 0.9 % IV BOLUS (SEPSIS)
1000.0000 mL | Freq: Once | INTRAVENOUS | Status: AC
  Administered 2017-01-06: 1000 mL via INTRAVENOUS

## 2017-01-06 MED ORDER — CITALOPRAM HYDROBROMIDE 20 MG PO TABS
20.0000 mg | ORAL_TABLET | Freq: Every day | ORAL | Status: DC
  Administered 2017-01-07 – 2017-01-12 (×6): 20 mg via ORAL
  Filled 2017-01-06 (×6): qty 1

## 2017-01-06 MED ORDER — IOPAMIDOL (ISOVUE-300) INJECTION 61%
100.0000 mL | Freq: Once | INTRAVENOUS | Status: AC | PRN
  Administered 2017-01-06: 100 mL via INTRAVENOUS

## 2017-01-06 MED ORDER — ONDANSETRON 4 MG PO TBDP
ORAL_TABLET | ORAL | Status: AC
  Filled 2017-01-06: qty 1

## 2017-01-06 MED ORDER — LORAZEPAM 1 MG PO TABS
1.0000 mg | ORAL_TABLET | Freq: Three times a day (TID) | ORAL | Status: DC
  Administered 2017-01-06 – 2017-01-12 (×17): 1 mg via ORAL
  Filled 2017-01-06 (×17): qty 1

## 2017-01-06 MED ORDER — ENOXAPARIN SODIUM 40 MG/0.4ML ~~LOC~~ SOLN
40.0000 mg | SUBCUTANEOUS | Status: DC
  Administered 2017-01-06 – 2017-01-11 (×6): 40 mg via SUBCUTANEOUS
  Filled 2017-01-06 (×6): qty 0.4

## 2017-01-06 MED ORDER — ONDANSETRON HCL 4 MG/2ML IJ SOLN
4.0000 mg | Freq: Once | INTRAMUSCULAR | Status: AC
  Administered 2017-01-06: 4 mg via INTRAVENOUS
  Filled 2017-01-06: qty 2

## 2017-01-06 MED ORDER — TRAZODONE HCL 50 MG PO TABS
100.0000 mg | ORAL_TABLET | Freq: Every day | ORAL | Status: DC
  Administered 2017-01-06 – 2017-01-11 (×6): 100 mg via ORAL
  Filled 2017-01-06 (×6): qty 2

## 2017-01-06 NOTE — ED Provider Notes (Signed)
[facility]-EMERGENCY DEPT Provider Note   CSN: [id number] Arrival date & time: 01/06/17  1528     History   Chief Complaint Chief Complaint  Patient presents with  . Abdominal Pain  . Nausea    HPI Amanda Carrillo Date is a 41 y.o. female.  Patient with onset yesterday of epigastric periumbilical abdominal pain nausea and vomiting. Several episodes of vomiting no blood. No diarrhea. Pain is nonradiating. No fevers. No dysuria. Patient's had her gallbladder removed in the past. The pain is crampy in nature and very intense 10 out of 10 when it occurs. But then it eases off.      Past Medical History:  Diagnosis Date  . ADHD (attention deficit hyperactivity disorder)   . Anxiety   . Depression   . Polysubstance abuse    herion, methadone, etoh    Patient Active Problem List   Diagnosis Date Noted  . Small bowel obstruction, partial (HCC) 01/06/2017  . Partial small bowel obstruction (HCC) 01/06/2017  . ADHD (attention deficit hyperactivity disorder)   . Anxiety   . Depression     Past Surgical History:  Procedure Laterality Date  . CHOLECYSTECTOMY    . TUBAL LIGATION    . TUBAL LIGATION      OB History    Gravida Para Term Preterm AB Living   3 2 2   1      SAB TAB Ectopic Multiple Live Births     1             Home Medications    Prior to Admission medications   Medication Sig Start Date End Date Taking? Authorizing Provider  anti-nausea (EMETROL) solution Take 10 mLs by mouth every 15 (fifteen) minutes as needed for nausea or vomiting.   Yes [provider]  citalopram (CELEXA) 20 MG tablet Take 20 mg by mouth daily.   Yes [provider]  dimenhyDRINATE (DRAMAMINE) 50 MG tablet Take 50 mg by mouth every 8 (eight) hours as needed.   Yes [provider]  ibuprofen (ADVIL,MOTRIN) 200 MG tablet Take 400 mg by mouth every 6 (six) hours as needed (dental pain).    Yes [provider]  LORazepam (ATIVAN) 1 MG tablet Take 1 mg by mouth  every 8 (eight) hours.   Yes [provider]  risperiDONE (RISPERDAL) 0.5 MG tablet Take 0.5 mg by mouth at bedtime.   Yes [provider]  simethicone (GAS RELIEF) 80 MG chewable tablet Chew 80 mg by mouth every 6 (six) hours as needed for flatulence.   Yes [provider]  traZODone (DESYREL) 100 MG tablet Take 100 mg by mouth at bedtime.   Yes [provider]    Family History Family History  Problem Relation Age of Onset  . Pneumonia Other     Social History Social History  Substance Use Topics  . Smoking status: Current Every Day Smoker    Packs/day: 1.00    Types: Cigarettes  . Smokeless tobacco: Never Used  . Alcohol use Yes     Comment: wine, liquor     Allergies   Toradol [ketorolac tromethamine]   Review of Systems Review of Systems  Constitutional: Negative for fever.  HENT: Negative for congestion.   Eyes: Negative for redness.  Respiratory: Positive for shortness of breath.   Cardiovascular: Positive for chest pain.  Gastrointestinal: Positive for abdominal pain, nausea and vomiting. Negative for diarrhea.  Genitourinary: Negative for dysuria.  Musculoskeletal: Negative for back pain.  Neurological:  Negative for headaches.  Hematological: Does not bruise/bleed easily.  Psychiatric/Behavioral: Negative for confusion.     Physical Exam Updated Vital Signs BP (!) 155/105   Pulse 97   Temp 98.8 F (37.1 C) (Oral)   Resp 19   Ht 1.753 m (5\' 9" )   Wt 96.6 kg (213 lb)   LMP 12/15/2016   SpO2 97%   BMI 31.45 kg/m   Physical Exam  Constitutional: She is oriented to person, place, and time. She appears well-developed and well-nourished. She appears distressed.  HENT:  Head: Normocephalic and atraumatic.  Mucus membranes dry.  Eyes: Conjunctivae and EOM are normal. Pupils are equal, round, and reactive to light.  Neck: Normal range of motion. Neck supple.  Cardiovascular: Normal rate, regular rhythm and normal  heart sounds.   Pulmonary/Chest: Effort normal and breath sounds normal.  Abdominal: Soft. Bowel sounds are normal. There is tenderness. There is no guarding.  Musculoskeletal: Normal range of motion. She exhibits no edema.  Neurological: She is alert and oriented to person, place, and time. No cranial nerve deficit or sensory deficit. She exhibits normal muscle tone. Coordination normal.  Skin: Skin is warm. No rash noted.  Nursing note and vitals reviewed.    ED Treatments / Results  Labs (all labs ordered are listed, but only abnormal results are displayed) Labs Reviewed  COMPREHENSIVE METABOLIC PANEL - Abnormal; Notable for the following:       Result Value   Glucose, Bld 127 (*)    Total Protein 9.0 (*)    All other components within normal limits  CBC - Abnormal; Notable for the following:    WBC 20.0 (*)    Hemoglobin 17.0 (*)    HCT 47.6 (*)    All other components within normal limits  URINALYSIS, ROUTINE W REFLEX MICROSCOPIC - Abnormal; Notable for the following:    Specific Gravity, Urine >1.046 (*)    Leukocytes, UA SMALL (*)    Bacteria, UA RARE (*)    Squamous Epithelial / LPF 6-30 (*)    All other components within normal limits  LIPASE, BLOOD  PREGNANCY, URINE  MAGNESIUM    EKG  EKG Interpretation None       Radiology Ct Abdomen Pelvis W Contrast  Result Date: 01/06/2017 CLINICAL DATA:  Upper abdominal pain with nausea EXAM: CT ABDOMEN AND PELVIS WITH CONTRAST TECHNIQUE: Multidetector CT imaging of the abdomen and pelvis was performed using the standard protocol following bolus administration of intravenous contrast. CONTRAST:  ISOVUE-300 IOPAMIDOL (ISOVUE-300) INJECTION 61% COMPARISON:  02/22/2011 FINDINGS: Lower chest: Lung bases demonstrate no acute consolidation or effusion. Normal heart size. Hepatobiliary: No focal liver abnormality is seen. Status post cholecystectomy. No biliary dilatation. Pancreas: Unremarkable. No pancreatic ductal  dilatation or surrounding inflammatory changes. Spleen: Normal in size without focal abnormality. Adrenals/Urinary Tract: Adrenal glands are unremarkable. Kidneys are normal, without renal calculi, focal lesion, or hydronephrosis. Bladder is unremarkable. Stomach/Bowel: Stomach within normal limits. Dilated, fluid-filled proximal small bowel loops measuring up to 3 cm. Additional scattered borderline enlarged loops of small bowel in the upper pelvis and left lower quadrant. No colon wall thickening. Normal appendix. Vascular/Lymphatic: No significant vascular findings are present. No enlarged abdominal or pelvic lymph nodes. Reproductive: Uterus and bilateral adnexa are unremarkable. Other: No free air or free fluid.  Fat in the umbilicus. Musculoskeletal: No acute or suspicious bone lesion. IMPRESSION: 1. Few mildly dilated fluid-filled loops of small bowel in the upper abdomen, left lower quadrant and upper pelvis. Findings  could be secondary to partial or developing obstruction with ileus also a consideration. Electronically Signed   By: Jasmine Pang M.D.   On: 01/06/2017 19:44    Procedures Procedures (including critical care time)  Medications Ordered in ED Medications  ondansetron (ZOFRAN-ODT) 4 MG disintegrating tablet (not administered)  0.9 %  sodium chloride infusion (not administered)  ondansetron (ZOFRAN-ODT) disintegrating tablet 4 mg (4 mg Oral Given 01/06/17 1544)  sodium chloride 0.9 % bolus 1,000 mL (1,000 mLs Intravenous New Bag/Given 01/06/17 1845)  ondansetron (ZOFRAN) injection 4 mg (4 mg Intravenous Given 01/06/17 1848)  HYDROmorphone (DILAUDID) injection 1 mg (1 mg Intravenous Given 01/06/17 1849)  iopamidol (ISOVUE-300) 61 % injection 100 mL (100 mLs Intravenous Contrast Given 01/06/17 1915)     Initial Impression / Assessment and Plan / ED Course  I have reviewed the triage vital signs and the nursing notes.  Pertinent labs & imaging results that were available during my  care of the patient were reviewed by me and considered in my medical decision making (see chart for details).     The patient's clinical presentation as well as CT scan suggestive of the partial small bowel obstruction. Patient very hemoconcentrated. We'll give another liter of normal saline. Discussed with Dr. Lovell Sheehan who will consult from general surgery. Hospitalist will admit. Patient has had prior abdominal surgery. Gallbladder is been removed. Patient has a markedly leukocytosis but liver function tests without significant abnormalities. Urinalysis still pending.  Patient with significant improvement with pain medicine and antinausea medicine. No further vomiting.  Patient does have a prior history of substance abuse.  Final Clinical Impressions(s) / ED Diagnoses   Final diagnoses:  Partial small bowel obstruction [facility])    New Prescriptions New Prescriptions   No medications on file     Vanetta Mulders, MD 01/06/17 2042

## 2017-01-06 NOTE — ED Triage Notes (Signed)
Patient complains of abdominal pain and nausea that started yesterday.

## 2017-01-06 NOTE — ED Notes (Signed)
Pt has not been able to void, states when she voided at home is was very dark, continues to have pain in abdomen, nausea better after zofran

## 2017-01-06 NOTE — H&P (Signed)
History and Physical    ED RAYSON WUJ:[id number] DOB: 1976-06-11 DOA: 01/06/2017  PCP: Patient, No Pcp Per  Patient coming from: home  Chief Complaint:  Abdominal pain and vomiting  HPI: Amanda Carrillo is a 41 y.o. female with medical history significant of ADHD, depression, anxiety, s/p chole comes in with one day of worsening upper abdominal pain with vomiting and a lot of nausea.  No diarrhea.  Vomit nonbloody.  No cough.  No fevers.  No sick contacts.  Feels better after some zofran in ED and has stopped vomiting but is still nauseas.  Found to have psbo, referred for admission for treatment.   Review of Systems: As per HPI otherwise 10 point review of systems negative.   Past Medical History:  Diagnosis Date  . ADHD (attention deficit hyperactivity disorder)   . Anxiety   . Depression   . Polysubstance abuse    herion, methadone, etoh    Past Surgical History:  Procedure Laterality Date  . CHOLECYSTECTOMY    . TUBAL LIGATION    . TUBAL LIGATION       reports that she has been smoking Cigarettes.  She has been smoking about 1.00 pack per day. She has never used smokeless tobacco. She reports that she drinks alcohol. She reports that she does not use drugs.  Allergies  Allergen Reactions  . Toradol [Ketorolac Tromethamine] Hives and Nausea And Vomiting    Family History  Problem Relation Age of Onset  . Pneumonia Other     Prior to Admission medications   Medication Sig Start Date End Date Taking? Authorizing Provider  anti-nausea (EMETROL) solution Take 10 mLs by mouth every 15 (fifteen) minutes as needed for nausea or vomiting.   Yes [provider]  citalopram (CELEXA) 20 MG tablet Take 20 mg by mouth daily.   Yes [provider]  dimenhyDRINATE (DRAMAMINE) 50 MG tablet Take 50 mg by mouth every 8 (eight) hours as needed.   Yes [provider]  ibuprofen (ADVIL,MOTRIN) 200 MG tablet Take 400 mg by mouth every 6 (six) hours as needed  (dental pain).    Yes [provider]  LORazepam (ATIVAN) 1 MG tablet Take 1 mg by mouth every 8 (eight) hours.   Yes [provider]  risperiDONE (RISPERDAL) 0.5 MG tablet Take 0.5 mg by mouth at bedtime.   Yes [provider]  simethicone (GAS RELIEF) 80 MG chewable tablet Chew 80 mg by mouth every 6 (six) hours as needed for flatulence.   Yes [provider]  traZODone (DESYREL) 100 MG tablet Take 100 mg by mouth at bedtime.   Yes [provider]    Physical Exam: Vitals:   01/06/17 1538 01/06/17 1539 01/06/17 1808 01/06/17 1830  BP:  131/84 (!) 160/101 (!) 155/105  Pulse: (!) 129  94 97  Resp: 18  11 19   Temp: 98.6 F (37 C)  98.8 F (37.1 C)   TempSrc: Oral  Oral   SpO2: 96%  97% 97%  Weight:  96.6 kg (213 lb)    Height:  5\' 9"  (1.753 m)      Constitutional: NAD, calm, comfortable Vitals:   01/06/17 1538 01/06/17 1539 01/06/17 1808 01/06/17 1830  BP:  131/84 (!) 160/101 (!) 155/105  Pulse: (!) 129  94 97  Resp: 18  11 19   Temp: 98.6 F (37 C)  98.8 F (37.1 C)   TempSrc: Oral  Oral   SpO2: 96%  97% 97%  Weight:  96.6 kg (213 lb)    Height:  5\' 9"  (1.753 m)     Eyes: PERRL, lids and conjunctivae normal ENMT: Mucous membranes are moist. Posterior pharynx clear of any exudate or lesions.Normal dentition.  Neck: normal, supple, no masses, no thyromegaly Respiratory: clear to auscultation bilaterally, no wheezing, no crackles. Normal respiratory effort. No accessory muscle use.  Cardiovascular: Regular rate and rhythm, no murmurs / rubs / gallops. No extremity edema. 2+ pedal pulses. No carotid bruits.  Abdomen: no tenderness, no masses palpated. No hepatosplenomegaly. Bowel sounds positive.  Musculoskeletal: no clubbing / cyanosis. No joint deformity upper and lower extremities. Good ROM, no contractures. Normal muscle tone.  Skin: no rashes, lesions, ulcers. No induration Neurologic: CN 2-12 grossly intact. Sensation intact,  DTR normal. Strength 5/5 in all 4.  Psychiatric: Normal judgment and insight. Alert and oriented x 3. Normal mood.    Labs on Admission: I have personally reviewed following labs and imaging studies  CBC:  Recent Labs Lab 01/06/17 1640  WBC 20.0*  HGB 17.0*  HCT 47.6*  MCV 93.5  PLT 232   Basic Metabolic Panel:  Recent Labs Lab 01/06/17 1640  NA 137  K 3.5  CL 101  CO2 22  GLUCOSE 127*  BUN 14  CREATININE 0.80  CALCIUM 9.9   GFR: Estimated Creatinine Clearance: 114.5 mL/min (by C-G formula based on SCr of 0.8 mg/dL). Liver Function Tests:  Recent Labs Lab 01/06/17 1640  AST 26  ALT 33  ALKPHOS 64  BILITOT 1.2  PROT 9.0*  ALBUMIN 4.7    Recent Labs Lab 01/06/17 1640  LIPASE 25   Urine analysis:    Component Value Date/Time   COLORURINE YELLOW 01/06/2017 1950   APPEARANCEUR CLEAR 01/06/2017 1950   LABSPEC >1.046 (H) 01/06/2017 1950   PHURINE 6.0 01/06/2017 1950   GLUCOSEU NEGATIVE 01/06/2017 1950   HGBUR NEGATIVE 01/06/2017 1950   BILIRUBINUR NEGATIVE 01/06/2017 1950   KETONESUR NEGATIVE 01/06/2017 1950   PROTEINUR NEGATIVE 01/06/2017 1950   UROBILINOGEN 0.2 06/19/2015 2330   NITRITE NEGATIVE 01/06/2017 1950   LEUKOCYTESUR SMALL (A) 01/06/2017 1950   Radiological Exams on Admission: Ct Abdomen Pelvis W Contrast  Result Date: 01/06/2017 CLINICAL DATA:  Upper abdominal pain with nausea EXAM: CT ABDOMEN AND PELVIS WITH CONTRAST TECHNIQUE: Multidetector CT imaging of the abdomen and pelvis was performed using the standard protocol following bolus administration of intravenous contrast. CONTRAST:  100mL ISOVUE-300 IOPAMIDOL (ISOVUE-300) INJECTION 61% COMPARISON:  02/22/2011 FINDINGS: Lower chest: Lung bases demonstrate no acute consolidation or effusion. Normal heart size. Hepatobiliary: No focal liver abnormality is seen. Status post cholecystectomy. No biliary dilatation. Pancreas: Unremarkable. No pancreatic ductal dilatation or surrounding  inflammatory changes. Spleen: Normal in size without focal abnormality. Adrenals/Urinary Tract: Adrenal glands are unremarkable. Kidneys are normal, without renal calculi, focal lesion, or hydronephrosis. Bladder is unremarkable. Stomach/Bowel: Stomach within normal limits. Dilated, fluid-filled proximal small bowel loops measuring up to 3 cm. Additional scattered borderline enlarged loops of small bowel in the upper pelvis and left lower quadrant. No colon wall thickening. Normal appendix. Vascular/Lymphatic: No significant vascular findings are present. No enlarged abdominal or pelvic lymph nodes. Reproductive: Uterus and bilateral adnexa are unremarkable. Other: No free air or free fluid.  Fat in the umbilicus. Musculoskeletal: No acute or suspicious bone lesion. IMPRESSION: 1. Few mildly dilated fluid-filled loops of small bowel in the upper abdomen, left lower quadrant and upper pelvis. Findings could be secondary to partial or developing obstruction with ileus also  a consideration. Electronically Signed   By: Jasmine Pang M.D.   On: 01/06/2017 19:44     Assessment/Plan 41 yo female with psbo  Active Problems:   Small bowel obstruction, partial (HCC)- mild, bowel rest.  Npo x meds.  Ivf.  Check mag level.  Rest bowel and consider advancing diet in the am.  surg consulted and aware.    ADHD (attention deficit hyperactivity disorder)- stable   Anxiety- stable   Depression- stable     DVT prophylaxis: scds Code Status:  full Family Communication:  none  Disposition Plan:  Per day team Consults called:  General surgery dr Lovell Sheehan Admission status:  admission   Kimala Horne A MD [facility]  If 7PM-7AM, please contact night-coverage [email or web address] Password [email or web address]  01/06/2017, 8:40 PM

## 2017-01-07 ENCOUNTER — Inpatient Hospital Stay (HOSPITAL_COMMUNITY): Payer: Self-pay

## 2017-01-07 DIAGNOSIS — F419 Anxiety disorder, unspecified: Secondary | ICD-10-CM

## 2017-01-07 DIAGNOSIS — K56600 Partial intestinal obstruction, unspecified as to cause: Principal | ICD-10-CM

## 2017-01-07 LAB — BASIC METABOLIC PANEL
Anion gap: 9 (ref 5–15)
BUN: 9 mg/dL (ref 6–20)
CHLORIDE: 108 mmol/L (ref 101–111)
CO2: 23 mmol/L (ref 22–32)
Calcium: 8.2 mg/dL — ABNORMAL LOW (ref 8.9–10.3)
Creatinine, Ser: 0.86 mg/dL (ref 0.44–1.00)
GFR calc Af Amer: >60 mL/min (ref >60)
GLUCOSE: 94 mg/dL (ref 65–99)
POTASSIUM: 3.6 mmol/L (ref 3.5–5.1)
Sodium: 140 mmol/L (ref 135–145)

## 2017-01-07 LAB — CBC
HEMATOCRIT: 42.2 % (ref 36.0–46.0)
Hemoglobin: 14.1 g/dL (ref 12.0–15.0)
MCH: 31.8 pg (ref 26.0–34.0)
MCHC: 33.4 g/dL (ref 30.0–36.0)
MCV: 95 fL (ref 78.0–100.0)
Platelets: 167 10*3/uL (ref 150–400)
RBC: 4.44 MIL/uL (ref 3.87–5.11)
RDW: 13 % (ref 11.5–15.5)
WBC: 13.6 10*3/uL — ABNORMAL HIGH (ref 4.0–10.5)

## 2017-01-07 LAB — MRSA PCR SCREENING: MRSA BY PCR: NEGATIVE

## 2017-01-07 MED ORDER — SIMETHICONE 80 MG PO CHEW
80.0000 mg | CHEWABLE_TABLET | Freq: Four times a day (QID) | ORAL | Status: DC | PRN
  Administered 2017-01-07: 80 mg via ORAL
  Filled 2017-01-07: qty 1

## 2017-01-07 MED ORDER — MORPHINE SULFATE (PF) 2 MG/ML IV SOLN
1.0000 mg | INTRAVENOUS | Status: DC | PRN
  Administered 2017-01-07 – 2017-01-12 (×27): 1 mg via INTRAVENOUS
  Filled 2017-01-07 (×29): qty 1

## 2017-01-07 MED ORDER — ALUM & MAG HYDROXIDE-SIMETH 200-200-20 MG/5ML PO SUSP
30.0000 mL | Freq: Four times a day (QID) | ORAL | Status: DC | PRN
  Administered 2017-01-07: 30 mL via ORAL
  Filled 2017-01-07: qty 30

## 2017-01-07 MED ORDER — PANTOPRAZOLE SODIUM 40 MG IV SOLR
40.0000 mg | Freq: Two times a day (BID) | INTRAVENOUS | Status: DC
  Administered 2017-01-07 – 2017-01-12 (×11): 40 mg via INTRAVENOUS
  Filled 2017-01-07 (×11): qty 40

## 2017-01-07 MED ORDER — ACETAMINOPHEN 325 MG PO TABS
650.0000 mg | ORAL_TABLET | Freq: Four times a day (QID) | ORAL | Status: DC | PRN
  Filled 2017-01-07: qty 2

## 2017-01-07 MED ORDER — SUCRALFATE 1 GM/10ML PO SUSP
1.0000 g | Freq: Three times a day (TID) | ORAL | Status: DC
  Administered 2017-01-07 – 2017-01-12 (×16): 1 g via ORAL
  Filled 2017-01-07 (×16): qty 10

## 2017-01-07 MED ORDER — DEXTROSE IN LACTATED RINGERS 5 % IV SOLN
INTRAVENOUS | Status: DC
  Administered 2017-01-07 – 2017-01-09 (×3): via INTRAVENOUS

## 2017-01-07 MED ORDER — DIPHENHYDRAMINE HCL 50 MG/ML IJ SOLN
12.5000 mg | Freq: Four times a day (QID) | INTRAMUSCULAR | Status: DC | PRN
  Administered 2017-01-08 – 2017-01-09 (×2): 12.5 mg via INTRAVENOUS
  Filled 2017-01-07 (×2): qty 1

## 2017-01-07 NOTE — Consult Note (Signed)
Reason for Consult: Partial small bowel obstruction Referring Physician: Dr. Timoteo Ace is an 41 y.o. female.  HPI: Patient is a 41 year old white female who presented to the emergency room with increasing epigastric pain. She states that she has had some emesis and nausea. She does suffer from heartburn. She states she has been having bowel movements daily. She is passing flatus. This started 48 hours ago. She states her pain is 8 out of 10. CT scan of the abdomen was done yesterday evening which revealed nonspecific bowel gas pattern, though a partial early bowel obstruction cannot be ruled out. She has had a tubal ligation and laparoscopic cholecystectomy in the past. She was admitted to the hospital for further evaluation and treatment. She states she did have a bowel movement this morning and has been passing gas. She still complains of epigastric pain and belching.  Past Medical History:  Diagnosis Date  . ADHD (attention deficit hyperactivity disorder)   . Anxiety   . Depression   . Polysubstance abuse    herion, methadone, etoh    Past Surgical History:  Procedure Laterality Date  . CHOLECYSTECTOMY    . TUBAL LIGATION    . TUBAL LIGATION      Family History  Problem Relation Age of Onset  . Pneumonia Other     Social History:  reports that she has been smoking Cigarettes.  She has been smoking about 1.00 pack per day. She has never used smokeless tobacco. She reports that she drinks alcohol. She reports that she does not use drugs.  Allergies:  Allergies  Allergen Reactions  . Toradol [Ketorolac Tromethamine] Hives and Nausea And Vomiting    Medications:  Scheduled: . citalopram  20 mg Oral Daily  . enoxaparin (LOVENOX) injection  40 mg Subcutaneous Q24H  . LORazepam  1 mg Oral Q8H  . pantoprazole (PROTONIX) IV  40 mg Intravenous Q12H  . risperiDONE  0.5 mg Oral QHS  . traZODone  100 mg Oral QHS    Results for orders placed or performed during the  hospital encounter of 01/06/17 (from the past 48 hour(s))  Lipase, blood     Status: None   Collection Time: 01/06/17  4:40 PM  Result Value Ref Range   Lipase 25 11 - 51 U/L  Comprehensive metabolic panel     Status: Abnormal   Collection Time: 01/06/17  4:40 PM  Result Value Ref Range   Sodium 137 135 - 145 mmol/L   Potassium 3.5 3.5 - 5.1 mmol/L   Chloride 101 101 - 111 mmol/L   CO2 22 22 - 32 mmol/L   Glucose, Bld 127 (H) 65 - 99 mg/dL   BUN 14 6 - 20 mg/dL   Creatinine, Ser 0.80 0.44 - 1.00 mg/dL   Calcium 9.9 8.9 - 10.3 mg/dL   Total Protein 9.0 (H) 6.5 - 8.1 g/dL   Albumin 4.7 3.5 - 5.0 g/dL   AST 26 15 - 41 U/L   ALT 33 14 - 54 U/L   Alkaline Phosphatase 64 38 - 126 U/L   Total Bilirubin 1.2 0.3 - 1.2 mg/dL   GFR calc non Af Amer >60 >60 mL/min   GFR calc Af Amer >60 >60 mL/min    Comment: (NOTE) The eGFR has been calculated using the CKD EPI equation. This calculation has not been validated in all clinical situations. eGFR's persistently <60 mL/min signify possible Chronic Kidney Disease.    Anion gap 14 5 - 15  CBC     Status: Abnormal   Collection Time: 01/06/17  4:40 PM  Result Value Ref Range   WBC 20.0 (H) 4.0 - 10.5 K/uL   RBC 5.09 3.87 - 5.11 MIL/uL   Hemoglobin 17.0 (H) 12.0 - 15.0 g/dL   HCT 47.6 (H) 36.0 - 46.0 %   MCV 93.5 78.0 - 100.0 fL   MCH 33.4 26.0 - 34.0 pg   MCHC 35.7 30.0 - 36.0 g/dL   RDW 12.8 11.5 - 15.5 %   Platelets 232 150 - 400 K/uL  Magnesium     Status: None   Collection Time: 01/06/17  4:40 PM  Result Value Ref Range   Magnesium 2.0 1.7 - 2.4 mg/dL  Urinalysis, Routine w reflex microscopic     Status: Abnormal   Collection Time: 01/06/17  7:50 PM  Result Value Ref Range   Color, Urine YELLOW YELLOW   APPearance CLEAR CLEAR   Specific Gravity, Urine >1.046 (H) 1.005 - 1.030   pH 6.0 5.0 - 8.0   Glucose, UA NEGATIVE NEGATIVE mg/dL   Hgb urine dipstick NEGATIVE NEGATIVE   Bilirubin Urine NEGATIVE NEGATIVE   Ketones, ur  NEGATIVE NEGATIVE mg/dL   Protein, ur NEGATIVE NEGATIVE mg/dL   Nitrite NEGATIVE NEGATIVE   Leukocytes, UA SMALL (A) NEGATIVE   RBC / HPF 0-5 0 - 5 RBC/hpf   WBC, UA 0-5 0 - 5 WBC/hpf   Bacteria, UA RARE (A) NONE SEEN   Squamous Epithelial / LPF 6-30 (A) NONE SEEN  Pregnancy, urine     Status: None   Collection Time: 01/06/17  7:50 PM  Result Value Ref Range   Preg Test, Ur NEGATIVE NEGATIVE    Comment:        THE SENSITIVITY OF THIS METHODOLOGY IS >20 mIU/mL.   MRSA PCR Screening     Status: None   Collection Time: 01/06/17 10:59 PM  Result Value Ref Range   MRSA by PCR NEGATIVE NEGATIVE    Comment:        The GeneXpert MRSA Assay (FDA approved for NASAL specimens only), is one component of a comprehensive MRSA colonization surveillance program. It is not intended to diagnose MRSA infection nor to guide or monitor treatment for MRSA infections.   Basic metabolic panel     Status: Abnormal   Collection Time: 01/07/17  5:25 AM  Result Value Ref Range   Sodium 140 135 - 145 mmol/L   Potassium 3.6 3.5 - 5.1 mmol/L   Chloride 108 101 - 111 mmol/L   CO2 23 22 - 32 mmol/L   Glucose, Bld 94 65 - 99 mg/dL   BUN 9 6 - 20 mg/dL   Creatinine, Ser 0.86 0.44 - 1.00 mg/dL   Calcium 8.2 (L) 8.9 - 10.3 mg/dL   GFR calc non Af Amer >60 >60 mL/min   GFR calc Af Amer >60 >60 mL/min    Comment: (NOTE) The eGFR has been calculated using the CKD EPI equation. This calculation has not been validated in all clinical situations. eGFR's persistently <60 mL/min signify possible Chronic Kidney Disease.    Anion gap 9 5 - 15  CBC     Status: Abnormal   Collection Time: 01/07/17  5:25 AM  Result Value Ref Range   WBC 13.6 (H) 4.0 - 10.5 K/uL   RBC 4.44 3.87 - 5.11 MIL/uL   Hemoglobin 14.1 12.0 - 15.0 g/dL   HCT 42.2 36.0 - 46.0 %   MCV 95.0 78.0 -  100.0 fL   MCH 31.8 26.0 - 34.0 pg   MCHC 33.4 30.0 - 36.0 g/dL   RDW 13.0 11.5 - 15.5 %   Platelets 167 150 - 400 K/uL    Ct Abdomen  Pelvis W Contrast  Result Date: 01/06/2017 CLINICAL DATA:  Upper abdominal pain with nausea EXAM: CT ABDOMEN AND PELVIS WITH CONTRAST TECHNIQUE: Multidetector CT imaging of the abdomen and pelvis was performed using the standard protocol following bolus administration of intravenous contrast. CONTRAST:  129m ISOVUE-300 IOPAMIDOL (ISOVUE-300) INJECTION 61% COMPARISON:  02/22/2011 FINDINGS: Lower chest: Lung bases demonstrate no acute consolidation or effusion. Normal heart size. Hepatobiliary: No focal liver abnormality is seen. Status post cholecystectomy. No biliary dilatation. Pancreas: Unremarkable. No pancreatic ductal dilatation or surrounding inflammatory changes. Spleen: Normal in size without focal abnormality. Adrenals/Urinary Tract: Adrenal glands are unremarkable. Kidneys are normal, without renal calculi, focal lesion, or hydronephrosis. Bladder is unremarkable. Stomach/Bowel: Stomach within normal limits. Dilated, fluid-filled proximal small bowel loops measuring up to 3 cm. Additional scattered borderline enlarged loops of small bowel in the upper pelvis and left lower quadrant. No colon wall thickening. Normal appendix. Vascular/Lymphatic: No significant vascular findings are present. No enlarged abdominal or pelvic lymph nodes. Reproductive: Uterus and bilateral adnexa are unremarkable. Other: No free air or free fluid.  Fat in the umbilicus. Musculoskeletal: No acute or suspicious bone lesion. IMPRESSION: 1. Few mildly dilated fluid-filled loops of small bowel in the upper abdomen, left lower quadrant and upper pelvis. Findings could be secondary to partial or developing obstruction with ileus also a consideration. Electronically Signed   By: KDonavan FoilM.D.   On: 01/06/2017 19:44    ROS:  Pertinent items noted in HPI and remainder of comprehensive ROS otherwise negative.  Blood pressure 109/65, pulse 76, temperature 98.3 F (36.8 C), temperature source Oral, resp. rate 18, height _0   (1.753 m), weight 219 lb 9.6 oz (99.6 kg), last menstrual period 12/15/2016, SpO2 96 %. Physical Exam: Well-developed well-nourished white female who is slightly anxious Head is normocephalic, atraumatic Neck is supple without lymphadenopathy Lungs clear auscultation with equal breath sounds bilaterally Heart examination reveals a regular rate and rhythm without S3, S4, murmurs. Abdomen is soft with some tenderness noted in the epigastric region. Bowel sounds are present. No rigidity is noted. No hepatosplenomegaly or hernias are noted. CT scan images personally reviewed.  Assessment/Plan: Impression: Abdominal pain, epigastric. Doubt this is a bowel obstruction. Possible gastroenteritis. Her leukocytosis is resolving. Plan: No need for acute surgical intervention. We will treat her epigastric pain as possible peptic ulcer disease, gastritis, gastroesophageal reflux disease.  MAviva Signs5/23/2018, 11:00 AM

## 2017-01-07 NOTE — Progress Notes (Signed)
PROGRESS NOTE    Amanda Carrillo  JYN:[id number] DOB: Oct 17, 1975 DOA: 01/06/2017 PCP: Patient, No Pcp Per    Brief Narrative:  41 yo female presented with abdominal pain and vomiting. Patient known to have ADHD, depression and anxiety, had cholecystectomy in the past. 24 hours of worsening upper abdominal pain with severe nausea and vomiting. On the initial physical examination heart rate 129, with blood pressure 160/101, with rr 19 and oxygen saturation 97%. His mucous membranes were moist. Her lungs were clear to auscultation, with rhythmic S1 and S2. Abdomen with no significant distention or pain. No lower extremity edema. CT with partial or developing small bowel obstruction or ileus. Patient admitted with the working diagnosis of bowel obstruction, surgery had been consulted.    Assessment & Plan:   Active Problems:   Small bowel obstruction, partial (HCC)   ADHD (attention deficit hyperactivity disorder)   Anxiety   Depression   Partial small bowel obstruction (HCC)  1. Small bowel obstruction. Patient had a bowel movement, but worsening pain, will order repeat abdominal films, add IV morphine and IV antiacid therapy with pantoprazole. Will continue antiemetics and follow with surgery recommendations. For now will allow to have ice chips. Will change IV fluids with Lactate ringers with dextrose.   2. Anxiety and depression.  No confusion or agitation, will continue citalopram, lorazepam, risperidone and trazodone.   3. Leukocytosis. Cell count down from 20 to 13, likely reactive, urine analysis negative for infection, will continue to hold on antibiotic therapy.   DVT prophylaxis: scd Code Status: Full  Family Communication:  Disposition Plan: Home .   Consultants:   Surgery   Procedures:    Antimicrobials:     Subjective: Patient had a formed bowel movement this am, but recurrent abdominal pain, dull in nature, moderate to severe in intensity and associated with  nausea, no radiation.   Objective: Vitals:   01/06/17 2100 01/06/17 2130 01/06/17 2218 01/07/17 0608  BP: 118/73 126/85 121/77 109/65  Pulse: 80 83 71 76  Resp: 17 19 20 18   Temp:   98.2 F (36.8 C) 98.3 F (36.8 C)  TempSrc:   Oral Oral  SpO2: 96% 95% 100% 96%  Weight:   99.6 kg (219 lb 9.6 oz)   Height:   5\' 9"  (1.753 m)     Intake/Output Summary (Last 24 hours) at 01/07/17 0844 Last data filed at 01/07/17 0400  Gross per 24 hour  Intake          3410.42 ml  Output                0 ml  Net          3410.42 ml   Filed Weights   01/06/17 1539 01/06/17 2218  Weight: 96.6 kg (213 lb) 99.6 kg (219 lb 9.6 oz)    Examination:  General exam: deconditioned and in pain E ENT. Mild pallor, no icterus, oral mucosa moist. Respiratory system: Clear to auscultation. Respiratory effort normal. No wheezing, rales or rhonchi. Cardiovascular system: S1 & S2 heard, RRR. No JVD, murmurs, rubs, gallops or clicks. ++ pitting edema. Gastrointestinal system: Abdomen is not distended or timpanic, soft. Tender to deep palpation at the epigastrium. No organomegaly or masses felt. Normal bowel sounds heard. Central nervous system: Alert and oriented. No focal neurological deficits. Extremities: Symmetric 5 x 5 power. Skin: No rashes, lesions or ulcers   Data Reviewed: I have personally reviewed following labs and imaging studies  CBC:  Recent  Labs Lab 01/06/17 1640 01/07/17 0525  WBC 20.0* 13.6*  HGB 17.0* 14.1  HCT 47.6* 42.2  MCV 93.5 95.0  PLT 232 167   Basic Metabolic Panel:  Recent Labs Lab 01/06/17 1640 01/07/17 0525  NA 137 140  K 3.5 3.6  CL 101 108  CO2 22 23  GLUCOSE 127* 94  BUN 14 9  CREATININE 0.80 0.86  CALCIUM 9.9 8.2*  MG 2.0  --    GFR: Estimated Creatinine Clearance: 108.2 mL/min (by C-G formula based on SCr of 0.86 mg/dL). Liver Function Tests:  Recent Labs Lab 01/06/17 1640  AST 26  ALT 33  ALKPHOS 64  BILITOT 1.2  PROT 9.0*  ALBUMIN 4.7     Recent Labs Lab 01/06/17 1640  LIPASE 25   No results for input(s): AMMONIA in the last 168 hours. Coagulation Profile: No results for input(s): INR, PROTIME in the last 168 hours. Cardiac Enzymes: No results for input(s): CKTOTAL, CKMB, CKMBINDEX, TROPONINI in the last 168 hours. BNP (last 3 results) No results for input(s): PROBNP in the last 8760 hours. HbA1C: No results for input(s): HGBA1C in the last 72 hours. CBG: No results for input(s): GLUCAP in the last 168 hours. Lipid Profile: No results for input(s): CHOL, HDL, LDLCALC, TRIG, CHOLHDL, LDLDIRECT in the last 72 hours. Thyroid Function Tests: No results for input(s): TSH, T4TOTAL, FREET4, T3FREE, THYROIDAB in the last 72 hours. Anemia Panel: No results for input(s): VITAMINB12, FOLATE, FERRITIN, TIBC, IRON, RETICCTPCT in the last 72 hours. Sepsis Labs: No results for input(s): PROCALCITON, LATICACIDVEN in the last 168 hours.  Recent Results (from the past 240 hour(s))  MRSA PCR Screening     Status: None   Collection Time: 01/06/17 10:59 PM  Result Value Ref Range Status   MRSA by PCR NEGATIVE NEGATIVE Final    Comment:        The GeneXpert MRSA Assay (FDA approved for NASAL specimens only), is one component of a comprehensive MRSA colonization surveillance program. It is not intended to diagnose MRSA infection nor to guide or monitor treatment for MRSA infections.          Radiology Studies: Ct Abdomen Pelvis W Contrast  Result Date: 01/06/2017 CLINICAL DATA:  Upper abdominal pain with nausea EXAM: CT ABDOMEN AND PELVIS WITH CONTRAST TECHNIQUE: Multidetector CT imaging of the abdomen and pelvis was performed using the standard protocol following bolus administration of intravenous contrast. CONTRAST:  ISOVUE-300 IOPAMIDOL (ISOVUE-300) INJECTION 61% COMPARISON:  02/22/2011 FINDINGS: Lower chest: Lung bases demonstrate no acute consolidation or effusion. Normal heart size. Hepatobiliary: No  focal liver abnormality is seen. Status post cholecystectomy. No biliary dilatation. Pancreas: Unremarkable. No pancreatic ductal dilatation or surrounding inflammatory changes. Spleen: Normal in size without focal abnormality. Adrenals/Urinary Tract: Adrenal glands are unremarkable. Kidneys are normal, without renal calculi, focal lesion, or hydronephrosis. Bladder is unremarkable. Stomach/Bowel: Stomach within normal limits. Dilated, fluid-filled proximal small bowel loops measuring up to 3 cm. Additional scattered borderline enlarged loops of small bowel in the upper pelvis and left lower quadrant. No colon wall thickening. Normal appendix. Vascular/Lymphatic: No significant vascular findings are present. No enlarged abdominal or pelvic lymph nodes. Reproductive: Uterus and bilateral adnexa are unremarkable. Other: No free air or free fluid.  Fat in the umbilicus. Musculoskeletal: No acute or suspicious bone lesion. IMPRESSION: 1. Few mildly dilated fluid-filled loops of small bowel in the upper abdomen, left lower quadrant and upper pelvis. Findings could be secondary to partial or developing obstruction with ileus  also a consideration. Electronically Signed   By: Jasmine Pang M.D.   On: 01/06/2017 19:44        Scheduled Meds: . citalopram  20 mg Oral Daily  . enoxaparin (LOVENOX) injection  40 mg Subcutaneous Q24H  . LORazepam  1 mg Oral Q8H  . pneumococcal 23 valent vaccine  0.5 mL Intramuscular Tomorrow-1000  . risperiDONE  0.5 mg Oral QHS  . traZODone  100 mg Oral QHS   Continuous Infusions: . 0.9 % NaCl with KCl 20 mEq / L 75 mL/hr at 01/06/17 2253     LOS: 1 day       Coralie Keens, MD [facility] Pager [phone]  If 7PM-7AM, please contact night-coverage [email or web address] Password [facility] 01/07/2017, 8:44 AM

## 2017-01-07 NOTE — Progress Notes (Signed)
Text paged patients complaints about pain and that she is not able to take Tylenol due to she says it causes liver problems.  She states that her pain is worse than it looks.

## 2017-01-08 DIAGNOSIS — K2900 Acute gastritis without bleeding: Secondary | ICD-10-CM

## 2017-01-08 LAB — BASIC METABOLIC PANEL
Anion gap: 7 (ref 5–15)
BUN: 5 mg/dL — AB (ref 6–20)
CO2: 28 mmol/L (ref 22–32)
CREATININE: 0.76 mg/dL (ref 0.44–1.00)
Calcium: 8.9 mg/dL (ref 8.9–10.3)
Chloride: 103 mmol/L (ref 101–111)
Glucose, Bld: 101 mg/dL — ABNORMAL HIGH (ref 65–99)
POTASSIUM: 3.5 mmol/L (ref 3.5–5.1)
SODIUM: 138 mmol/L (ref 135–145)

## 2017-01-08 LAB — CBC WITH DIFFERENTIAL/PLATELET
BASOS ABS: 0 10*3/uL (ref 0.0–0.1)
Basophils Relative: 0 %
EOS ABS: 0.1 10*3/uL (ref 0.0–0.7)
EOS PCT: 1 %
HCT: 46.8 % — ABNORMAL HIGH (ref 36.0–46.0)
Hemoglobin: 15.8 g/dL — ABNORMAL HIGH (ref 12.0–15.0)
LYMPHS ABS: 4.5 10*3/uL — AB (ref 0.7–4.0)
Lymphocytes Relative: 37 %
MCH: 31.9 pg (ref 26.0–34.0)
MCHC: 33.8 g/dL (ref 30.0–36.0)
MCV: 94.5 fL (ref 78.0–100.0)
Monocytes Absolute: 0.6 10*3/uL (ref 0.1–1.0)
Monocytes Relative: 5 %
NEUTROS PCT: 57 %
Neutro Abs: 6.9 10*3/uL (ref 1.7–7.7)
PLATELETS: 178 10*3/uL (ref 150–400)
RBC: 4.95 MIL/uL (ref 3.87–5.11)
RDW: 12.6 % (ref 11.5–15.5)
WBC: 12.1 10*3/uL — AB (ref 4.0–10.5)

## 2017-01-08 MED ORDER — POTASSIUM CHLORIDE CRYS ER 20 MEQ PO TBCR
40.0000 meq | EXTENDED_RELEASE_TABLET | Freq: Once | ORAL | Status: AC
  Administered 2017-01-08: 40 meq via ORAL
  Filled 2017-01-08: qty 2

## 2017-01-08 MED ORDER — NICOTINE 21 MG/24HR TD PT24
21.0000 mg | MEDICATED_PATCH | Freq: Every day | TRANSDERMAL | Status: DC
  Administered 2017-01-08: 21 mg via TRANSDERMAL
  Filled 2017-01-08 (×3): qty 1

## 2017-01-08 NOTE — Care Management Note (Signed)
Case Management Note  Patient Details  Name: Amanda Carrillo MRN: [id number] Date of Birth: 26-May-1976  Subjective/Objective:                  Pt from home, lives wit husband. She has no insurance and no PCP. Pt plans to return home with self care at DC. [facility]. She was told about using goodrx app for future Rx. She was given list of PCP's in the community who will see her with no insurance. Pt understands she is responsible for establishing care.   Action/Plan: Anticipate DC home in next 24 hrs. No further CM needs.   Expected Discharge Date:  01/08/17               Expected Discharge Plan:  Home/Self Care  In-House Referral:  NA  Discharge planning Services  CM Consult, [facility]  Post Acute Care Choice:  NA Choice offered to:  NA  Status of Service:  Completed, signed off  Malcolm MetroChild[place], Menno Vanbergen Demske, RN 01/08/2017, 1:26 PM

## 2017-01-08 NOTE — Progress Notes (Signed)
PROGRESS NOTE    Amanda Carrillo  AVW:[id number] DOB: 22-Jul-1976 DOA: 01/06/2017 PCP: Patient, No Pcp Per    Brief Narrative:  41 yo female presented with abdominal pain and vomiting. Patient known to have ADHD, depression and anxiety, had cholecystectomy in the past. 24 hours of worsening upper abdominal pain with severe nausea and vomiting. On the initial physical examination heart rate 129, with blood pressure 160/101, with rr 19 and oxygen saturation 97%. His mucous membranes were moist. Her lungs were clear to auscultation, with rhythmic S1 and S2. Abdomen with no significant distention or pain. No lower extremity edema. CT with partial or developing small bowel obstruction or ileus. Patient admitted with the working diagnosis of bowel obstruction, surgery had been consulted. Further work up and clinical presentation suggesting more gastritis than bowel obstruction, diet was advanced and placed on aggressive antiacid therapy.    Assessment & Plan:   Active Problems:   Small bowel obstruction, partial (HCC)   ADHD (attention deficit hyperactivity disorder)   Anxiety   Depression   Partial small bowel obstruction (HCC)   1. Acute gastritis. Persistent abdominal pain, will continue antiacid therapy with BID pantoprazole, qid sucralfate. Advance diet as tolerated. IV analgesics and antiemetics, will continue IV fluids. Case discussed with Dr. Lovell SheehanJenkins, no clinical signs of bowel obstruction/ small bowel obstruction ruled out. Of persistent abdominal pain and unable to tolerate po, may need diagnostic endoscopy. Smoking cessation.    2. Anxiety and depression. Neuro checks per unit protocol,  no confusion or agitation. Continue citalopram, lorazepam, risperidone and trazodone, per her home regimen.   3. Leukocytosis. Cell count down to 12, no signs of systemic infection. No indication for systemic antibiotic therapy.   4. Hypokalemia. K down to 3,5, will continue hydration with D5 LR, will  give 40 meq of kcl and follow renal panel in am, renal function with cr at 0.76.   DVT prophylaxis: scd Code Status: Full  Family Communication:  Disposition Plan: Home .   Consultants:   Surgery   Procedures:   Antimicrobials:   Subjective: Patient with persistent abdominal pain, epigastric, worse with po intake, associated with nausea but not vomiting, pain moderate to severe in intensity. Positive history of smoking, etho use, coffee and sodas at home.   Objective: Vitals:   01/07/17 0608 01/07/17 1300 01/07/17 2100 01/08/17 0701  BP: 109/65 (!) 146/93 (!) 175/99 (!) 168/93  Pulse: 76 74 64 63  Resp: 18 18 18 18   Temp: 98.3 F (36.8 C) 98.7 F (37.1 C) 98.1 F (36.7 C) 98.7 F (37.1 C)  TempSrc: Oral Oral Oral Oral  SpO2: 96% 98% 100% 99%  Weight:      Height:        Intake/Output Summary (Last 24 hours) at 01/08/17 0925 Last data filed at 01/08/17 0900  Gross per 24 hour  Intake              480 ml  Output                0 ml  Net              480 ml   Filed Weights   01/06/17 1539 01/06/17 2218  Weight: 96.6 kg (213 lb) 99.6 kg (219 lb 9.6 oz)    Examination:  General exam: deconditioned and in pain E ENT. Mild pallor, no icterus, oral mucosa moist.   Respiratory system: Clear to auscultation. Respiratory effort normal. No wheezing, rales or rhonchi.  Cardiovascular system: S1 & S2 heard, RRR. No JVD, murmurs, rubs, gallops or clicks. No pedal edema. Gastrointestinal system: Abdomen mild distended, soft, but tender at the epigastric area. No organomegaly or masses felt. Normal bowel sounds heard. Central nervous system: Alert and oriented. No focal neurological deficits. Extremities: Symmetric 5 x 5 power. Skin: No rashes, lesions or ulcers     Data Reviewed: I have personally reviewed following labs and imaging studies  CBC:  Recent Labs Lab 01/06/17 1640 01/07/17 0525 01/08/17 0546  WBC 20.0* 13.6* 12.1*  NEUTROABS  --   --  6.9  HGB  17.0* 14.1 15.8*  HCT 47.6* 42.2 46.8*  MCV 93.5 95.0 94.5  PLT 232 167 178   Basic Metabolic Panel:  Recent Labs Lab 01/06/17 1640 01/07/17 0525 01/08/17 0546  NA 137 140 138  K 3.5 3.6 3.5  CL 101 108 103  CO2 22 23 28   GLUCOSE 127* 94 101*  BUN 14 9 5*  CREATININE 0.80 0.86 0.76  CALCIUM 9.9 8.2* 8.9  MG 2.0  --   --    GFR: Estimated Creatinine Clearance: 116.3 mL/min (by C-G formula based on SCr of 0.76 mg/dL). Liver Function Tests:  Recent Labs Lab 01/06/17 1640  AST 26  ALT 33  ALKPHOS 64  BILITOT 1.2  PROT 9.0*  ALBUMIN 4.7    Recent Labs Lab 01/06/17 1640  LIPASE 25   No results for input(s): AMMONIA in the last 168 hours. Coagulation Profile: No results for input(s): INR, PROTIME in the last 168 hours. Cardiac Enzymes: No results for input(s): CKTOTAL, CKMB, CKMBINDEX, TROPONINI in the last 168 hours. BNP (last 3 results) No results for input(s): PROBNP in the last 8760 hours. HbA1C: No results for input(s): HGBA1C in the last 72 hours. CBG: No results for input(s): GLUCAP in the last 168 hours. Lipid Profile: No results for input(s): CHOL, HDL, LDLCALC, TRIG, CHOLHDL, LDLDIRECT in the last 72 hours. Thyroid Function Tests: No results for input(s): TSH, T4TOTAL, FREET4, T3FREE, THYROIDAB in the last 72 hours. Anemia Panel: No results for input(s): VITAMINB12, FOLATE, FERRITIN, TIBC, IRON, RETICCTPCT in the last 72 hours. Sepsis Labs: No results for input(s): PROCALCITON, LATICACIDVEN in the last 168 hours.  Recent Results (from the past 240 hour(s))  MRSA PCR Screening     Status: None   Collection Time: 01/06/17 10:59 PM  Result Value Ref Range Status   MRSA by PCR NEGATIVE NEGATIVE Final    Comment:        The GeneXpert MRSA Assay (FDA approved for NASAL specimens only), is one component of a comprehensive MRSA colonization surveillance program. It is not intended to diagnose MRSA infection nor to guide or monitor treatment  for MRSA infections.          Radiology Studies: Ct Abdomen Pelvis W Contrast  Result Date: 01/06/2017 CLINICAL DATA:  Upper abdominal pain with nausea EXAM: CT ABDOMEN AND PELVIS WITH CONTRAST TECHNIQUE: Multidetector CT imaging of the abdomen and pelvis was performed using the standard protocol following bolus administration of intravenous contrast. CONTRAST:  ISOVUE-300 IOPAMIDOL (ISOVUE-300) INJECTION 61% COMPARISON:  02/22/2011 FINDINGS: Lower chest: Lung bases demonstrate no acute consolidation or effusion. Normal heart size. Hepatobiliary: No focal liver abnormality is seen. Status post cholecystectomy. No biliary dilatation. Pancreas: Unremarkable. No pancreatic ductal dilatation or surrounding inflammatory changes. Spleen: Normal in size without focal abnormality. Adrenals/Urinary Tract: Adrenal glands are unremarkable. Kidneys are normal, without renal calculi, focal lesion, or hydronephrosis. Bladder is unremarkable. Stomach/Bowel:  Stomach within normal limits. Dilated, fluid-filled proximal small bowel loops measuring up to 3 cm. Additional scattered borderline enlarged loops of small bowel in the upper pelvis and left lower quadrant. No colon wall thickening. Normal appendix. Vascular/Lymphatic: No significant vascular findings are present. No enlarged abdominal or pelvic lymph nodes. Reproductive: Uterus and bilateral adnexa are unremarkable. Other: No free air or free fluid.  Fat in the umbilicus. Musculoskeletal: No acute or suspicious bone lesion. IMPRESSION: 1. Few mildly dilated fluid-filled loops of small bowel in the upper abdomen, left lower quadrant and upper pelvis. Findings could be secondary to partial or developing obstruction with ileus also a consideration. Electronically Signed   By: Jasmine Pang M.D.   On: 01/06/2017 19:44   Dg Abd 2 Views  Result Date: 01/07/2017 CLINICAL DATA:  Abdominal distention and nausea and vomiting. EXAM: ABDOMEN - 2 VIEW COMPARISON:  CT  scan dated 01/06/2017 FINDINGS: The bowel gas pattern is now normal. No residual dilated loops of small bowel. There is air scattered throughout the nondistended colon. No fecal impaction or excessive stool in the colon. Stomach is not distended. No free air or free fluid. Bones are normal. Surgical clips consistent with previous cholecystectomy. Phleboliths in the pelvis. IMPRESSION: Benign-appearing abdomen.  No dilated bowel Electronically Signed   By: Francene Boyers M.D.   On: 01/07/2017 11:30        Scheduled Meds: . citalopram  20 mg Oral Daily  . enoxaparin (LOVENOX) injection  40 mg Subcutaneous Q24H  . LORazepam  1 mg Oral Q8H  . pantoprazole (PROTONIX) IV  40 mg Intravenous Q12H  . risperiDONE  0.5 mg Oral QHS  . sucralfate  1 g Oral TID WC & HS  . traZODone  100 mg Oral QHS   Continuous Infusions: . dextrose 5% lactated ringers 75 mL/hr at 01/07/17 2240     LOS: 2 days     Coralie Keens, MD [facility] Pager [phone]  If 7PM-7AM, please contact night-coverage [email or web address] Password [email or web address] 01/08/2017, 9:25 AM

## 2017-01-09 ENCOUNTER — Encounter (HOSPITAL_COMMUNITY): Payer: Self-pay

## 2017-01-09 ENCOUNTER — Inpatient Hospital Stay (HOSPITAL_COMMUNITY): Payer: Self-pay

## 2017-01-09 DIAGNOSIS — E876 Hypokalemia: Secondary | ICD-10-CM

## 2017-01-09 DIAGNOSIS — F329 Major depressive disorder, single episode, unspecified: Secondary | ICD-10-CM

## 2017-01-09 DIAGNOSIS — R1110 Vomiting, unspecified: Secondary | ICD-10-CM

## 2017-01-09 LAB — CBC WITH DIFFERENTIAL/PLATELET
BASOS ABS: 0 10*3/uL (ref 0.0–0.1)
Basophils Relative: 0 %
EOS ABS: 0.1 10*3/uL (ref 0.0–0.7)
EOS PCT: 0 %
HCT: 41.5 % (ref 36.0–46.0)
Hemoglobin: 14.1 g/dL (ref 12.0–15.0)
LYMPHS PCT: 20 %
Lymphs Abs: 2.3 10*3/uL (ref 0.7–4.0)
MCH: 31.7 pg (ref 26.0–34.0)
MCHC: 34 g/dL (ref 30.0–36.0)
MCV: 93.3 fL (ref 78.0–100.0)
MONO ABS: 0.8 10*3/uL (ref 0.1–1.0)
Monocytes Relative: 7 %
Neutro Abs: 8.4 10*3/uL — ABNORMAL HIGH (ref 1.7–7.7)
Neutrophils Relative %: 73 %
Platelets: 148 10*3/uL — ABNORMAL LOW (ref 150–400)
RBC: 4.45 MIL/uL (ref 3.87–5.11)
RDW: 12.4 % (ref 11.5–15.5)
WBC: 11.6 10*3/uL — AB (ref 4.0–10.5)

## 2017-01-09 LAB — BASIC METABOLIC PANEL
Anion gap: 9 (ref 5–15)
BUN: 6 mg/dL (ref 6–20)
CALCIUM: 8.8 mg/dL — AB (ref 8.9–10.3)
CO2: 25 mmol/L (ref 22–32)
Chloride: 100 mmol/L — ABNORMAL LOW (ref 101–111)
Creatinine, Ser: 0.86 mg/dL (ref 0.44–1.00)
GFR calc Af Amer: >60 mL/min (ref >60)
GLUCOSE: 112 mg/dL — AB (ref 65–99)
Potassium: 3.2 mmol/L — ABNORMAL LOW (ref 3.5–5.1)
SODIUM: 134 mmol/L — AB (ref 135–145)

## 2017-01-09 LAB — URINALYSIS, COMPLETE (UACMP) WITH MICROSCOPIC
BACTERIA UA: NONE SEEN
Bilirubin Urine: NEGATIVE
Glucose, UA: NEGATIVE mg/dL
Ketones, ur: NEGATIVE mg/dL
Leukocytes, UA: NEGATIVE
NITRITE: NEGATIVE
PROTEIN: NEGATIVE mg/dL
SPECIFIC GRAVITY, URINE: 1.002 — AB (ref 1.005–1.030)
pH: 7 (ref 5.0–8.0)

## 2017-01-09 LAB — RAPID URINE DRUG SCREEN, HOSP PERFORMED
AMPHETAMINES: NOT DETECTED
BENZODIAZEPINES: NOT DETECTED
Barbiturates: NOT DETECTED
Cocaine: POSITIVE — AB
OPIATES: POSITIVE — AB
Tetrahydrocannabinol: POSITIVE — AB

## 2017-01-09 MED ORDER — TECHNETIUM TC 99M SULFUR COLLOID
2.0000 | Freq: Once | INTRAVENOUS | Status: AC | PRN
  Administered 2017-01-09: 2.1 via ORAL

## 2017-01-09 MED ORDER — POTASSIUM CHLORIDE IN NACL 40-0.9 MEQ/L-% IV SOLN
INTRAVENOUS | Status: DC
  Administered 2017-01-09 – 2017-01-10 (×2): 75 mL/h via INTRAVENOUS

## 2017-01-09 MED ORDER — SODIUM CHLORIDE 0.9 % IV SOLN: INTRAVENOUS | Status: DC

## 2017-01-09 MED ORDER — POLYETHYLENE GLYCOL 3350 17 G PO PACK: 17.0000 g | PACK | Freq: Two times a day (BID) | ORAL | Status: DC

## 2017-01-09 NOTE — Progress Notes (Signed)
Patient states " warm water from the shower running on her stomach helps relieve her stomach pain".

## 2017-01-09 NOTE — Progress Notes (Signed)
Noted Patient vomited X 1 in addition to earlier note. Total X 2 Emesis.

## 2017-01-09 NOTE — Progress Notes (Deleted)
  Subjective: Patient still with abdominal cramping. She did have a bowel movement yesterday. She is passing flatus.  Objective: Vital signs in last 24 hours: Temp:  [98.4 F (36.9 C)-99 F (37.2 C)] 98.4 F (36.9 C) (05/25 0622) Pulse Rate:  [67-79] 67 (05/25 0622) Resp:  [16-18] 18 (05/25 0622) BP: (112-153)/(66-70) 153/70 (05/25 0622) SpO2:  [98 %-99 %] 99 % (05/25 0622) Last BM Date: 01/08/17  Intake/Output from previous day: 05/24 0701 - 05/25 0700 In: 480 [P.O.:480] Out: 1 [Urine:1] Intake/Output this shift: No intake/output data recorded.  General appearance: alert, cooperative and no distress GI: Soft, slightly distended but no rigidity noted. No point tenderness noted. Bowel sounds active.  Lab Results:   Recent Labs  01/08/17 0546 01/09/17 0427  WBC 12.1* 11.6*  HGB 15.8* 14.1  HCT 46.8* 41.5  PLT 178 148*   BMET  Recent Labs  01/08/17 0546 01/09/17 0427  NA 138 134*  K 3.5 3.2*  CL 103 100*  CO2 28 25  GLUCOSE 101* 112*  BUN 5* 6  CREATININE 0.76 0.86  CALCIUM 8.9 8.8*   PT/INR No results for input(s): LABPROT, INR in the last 72 hours.  Studies/Results: Dg Abd 2 Views  Result Date: 01/07/2017 CLINICAL DATA:  Abdominal distention and nausea and vomiting. EXAM: ABDOMEN - 2 VIEW COMPARISON:  CT scan dated 01/06/2017 FINDINGS: The bowel gas pattern is now normal. No residual dilated loops of small bowel. There is air scattered throughout the nondistended colon. No fecal impaction or excessive stool in the colon. Stomach is not distended. No free air or free fluid. Bones are normal. Surgical clips consistent with previous cholecystectomy. Phleboliths in the pelvis. IMPRESSION: Benign-appearing abdomen.  No dilated bowel Electronically Signed   By: Francene BoyersJames  Maxwell M.D.   On: 01/07/2017 11:30    Anti-infectives: Anti-infectives    None      Assessment/Plan: Impression: Partial small bowel obstruction, resolving Plan: May advance diet as  tolerated. Will start MiraLAX. No need for acute surgical intervention.  LOS: 3 days    Amanda MachoMark Jamarea Selner 01/09/2017

## 2017-01-09 NOTE — Progress Notes (Signed)
PROGRESS NOTE  Amanda Carrillo ZOX:[id number] DOB: Apr 01, 1976 DOA: 01/06/2017 PCP: Patient, No Pcp Per  Brief History:  40 year old female with a history of anxiety/depression, tobacco abuse, history of polysubstance abuse presented with one-day history of abdominal pain and intractable vomiting. The patient denied any fevers, chills, chest pain, shortness breath, hematochezia, melena. CT of the abdomen and pelvis in the emergency department revealed few mildly dilated fluid-filled loops of bowel concerning for ileus versus early small bowel obstruction. General surgery was consulted to assist. They did not feel that the patient needed any surgical intervention. Ultimately, the patient's diet was advanced and she had a bowel movement on 01/08/2017. However she continues to complain of significant abdominal pain. She also continues to endorse numerous episodes of emesis although this cannot be verified and has not been seen with nursing staff.  Assessment/Plan: Partial small bowel obstruction -Appears to have improved clinically -01/07/2017 and 01/09/2017 abdominal x-rays negative for obstruction -Appreciate general surgery consult -Continue IV fluids  Intractable abdominal pain with vomiting -Suspect a degree of functional gastroparesis due to her opioids and acute medical illness -Minimize opioids -Antiemetics -Gastric emptying study -Continue PPI -Consult gastroenterology -5/25--2 episodes of emesis -emotional overlay compromises exam and confounds clinical course  Opioid seeking behavior -I have discussed with the patient that escalating her opioids can make her vomiting worse -I will not further escalate her opioids at this time unless there is objective evidence for worsening abdominal pain  Polysubstance abuse -Urine drug screen positive for cocaine and cannabis -Tobacco cessation discussed  Leukocytosis -Secondary to acute medical illness -Patient is afebrile and  hemodynamically stable -will not start any antibiotics -UA neg for pyuria  Depression/anxiety -Continue Celexa and home dose of Ativan -Continue Risperdal and trazodone  Hypokalemia -replete -check mag   Disposition Plan:   Home in 1-2 days  Family Communication:  No Family at bedside--Total time spent 35 minutes.  Greater than 50% spent face to face counseling and coordinating care.  Consultants:  GI  Code Status:  FULL  DVT Prophylaxis:  [place] Lovenox   Procedures: As Listed in Progress Note Above  Antibiotics: None    Subjective: Patient complained of numerous episodes of emesis overnight. Denies any fevers, chills, chest pain, short of breath. He complains of 10 over 10 abdominal pain. Denies any hematochezia, melena, dysuria, hematuria. Denies any headache or neck pain. No rashes.   Objective: Vitals:   01/08/17 1419 01/08/17 2015 01/09/17 0622 01/09/17 1300  BP: 112/66  (!) 153/70 (!) 142/84  Pulse: 79  67 73  Resp: 16  18 18   Temp: 99 F (37.2 C)  98.4 F (36.9 C) 98.6 F (37 C)  TempSrc: Oral  Oral Oral  SpO2: 98% 98% 99% 99%  Weight:      Height:        Intake/Output Summary (Last 24 hours) at 01/09/17 1754 Last data filed at 01/09/17 1600  Gross per 24 hour  Intake           491.25 ml  Output                3 ml  Net           488.25 ml   Weight change:  Exam:   General:  Pt is alert, follows commands appropriately, not in acute distress  HEENT: No icterus, No thrush, No neck mass, [place]/AT  Cardiovascular: RRR, S1/S2, no rubs, no gallops  Respiratory: CTA  bilaterally, no wheezing, no crackles, no rhonchi  Abdomen: Soft/+BS, Epigastric and periumbilical tender, non distended, no guarding  Extremities: No edema, No lymphangitis, No petechiae, No rashes, no synovitis   Data Reviewed: I have personally reviewed following labs and imaging studies Basic Metabolic Panel:  Recent Labs Lab 01/06/17 1640 01/07/17 0525 01/08/17 0546  01/09/17 0427  NA 137 140 138 134*  K 3.5 3.6 3.5 3.2*  CL 101 108 103 100*  CO2 22 23 28 25   GLUCOSE 127* 94 101* 112*  BUN 14 9 5* 6  CREATININE 0.80 0.86 0.76 0.86  CALCIUM 9.9 8.2* 8.9 8.8*  MG 2.0  --   --   --    Liver Function Tests:  Recent Labs Lab 01/06/17 1640  AST 26  ALT 33  ALKPHOS 64  BILITOT 1.2  PROT 9.0*  ALBUMIN 4.7    Recent Labs Lab 01/06/17 1640  LIPASE 25   No results for input(s): AMMONIA in the last 168 hours. Coagulation Profile: No results for input(s): INR, PROTIME in the last 168 hours. CBC:  Recent Labs Lab 01/06/17 1640 01/07/17 0525 01/08/17 0546 01/09/17 0427  WBC 20.0* 13.6* 12.1* 11.6*  NEUTROABS  --   --  6.9 8.4*  HGB 17.0* 14.1 15.8* 14.1  HCT 47.6* 42.2 46.8* 41.5  MCV 93.5 95.0 94.5 93.3  PLT 232 167 178 148*   Cardiac Enzymes: No results for input(s): CKTOTAL, CKMB, CKMBINDEX, TROPONINI in the last 168 hours. BNP: Invalid input(s): POCBNP CBG: No results for input(s): GLUCAP in the last 168 hours. HbA1C: No results for input(s): HGBA1C in the last 72 hours. Urine analysis:    Component Value Date/Time   COLORURINE STRAW (A) 01/09/2017 1107   APPEARANCEUR CLEAR 01/09/2017 1107   LABSPEC 1.002 (L) 01/09/2017 1107   PHURINE 7.0 01/09/2017 1107   GLUCOSEU NEGATIVE 01/09/2017 1107   HGBUR SMALL (A) 01/09/2017 1107   BILIRUBINUR NEGATIVE 01/09/2017 1107   KETONESUR NEGATIVE 01/09/2017 1107   PROTEINUR NEGATIVE 01/09/2017 1107   UROBILINOGEN 0.2 06/19/2015 2330   NITRITE NEGATIVE 01/09/2017 1107   LEUKOCYTESUR NEGATIVE 01/09/2017 1107   Sepsis Labs: @LABRCNTIP (procalcitonin:4,lacticidven:4) ) Recent Results (from the past 240 hour(s))  MRSA PCR Screening     Status: None   Collection Time: 01/06/17 10:59 PM  Result Value Ref Range Status   MRSA by PCR NEGATIVE NEGATIVE Final    Comment:        The GeneXpert MRSA Assay (FDA approved for NASAL specimens only), is one component of a comprehensive MRSA  colonization surveillance program. It is not intended to diagnose MRSA infection nor to guide or monitor treatment for MRSA infections.      Scheduled Meds: . citalopram  20 mg Oral Daily  . enoxaparin (LOVENOX) injection  40 mg Subcutaneous Q24H  . LORazepam  1 mg Oral Q8H  . nicotine  21 mg Transdermal Daily  . pantoprazole (PROTONIX) IV  40 mg Intravenous Q12H  . risperiDONE  0.5 mg Oral QHS  . sucralfate  1 g Oral TID WC & HS  . traZODone  100 mg Oral QHS   Continuous Infusions: . 0.9 % NaCl with KCl 40 mEq / L Stopped (01/09/17 1533)    Procedures/Studies: Ct Abdomen Pelvis W Contrast  Result Date: 01/06/2017 CLINICAL DATA:  Upper abdominal pain with nausea EXAM: CT ABDOMEN AND PELVIS WITH CONTRAST TECHNIQUE: Multidetector CT imaging of the abdomen and pelvis was performed using the standard protocol following bolus administration of intravenous contrast. CONTRAST:  100mL ISOVUE-300 IOPAMIDOL (ISOVUE-300) INJECTION 61% COMPARISON:  02/22/2011 FINDINGS: Lower chest: Lung bases demonstrate no acute consolidation or effusion. Normal heart size. Hepatobiliary: No focal liver abnormality is seen. Status post cholecystectomy. No biliary dilatation. Pancreas: Unremarkable. No pancreatic ductal dilatation or surrounding inflammatory changes. Spleen: Normal in size without focal abnormality. Adrenals/Urinary Tract: Adrenal glands are unremarkable. Kidneys are normal, without renal calculi, focal lesion, or hydronephrosis. Bladder is unremarkable. Stomach/Bowel: Stomach within normal limits. Dilated, fluid-filled proximal small bowel loops measuring up to 3 cm. Additional scattered borderline enlarged loops of small bowel in the upper pelvis and left lower quadrant. No colon wall thickening. Normal appendix. Vascular/Lymphatic: No significant vascular findings are present. No enlarged abdominal or pelvic lymph nodes. Reproductive: Uterus and bilateral adnexa are unremarkable. Other: No free air  or free fluid.  Fat in the umbilicus. Musculoskeletal: No acute or suspicious bone lesion. IMPRESSION: 1. Few mildly dilated fluid-filled loops of small bowel in the upper abdomen, left lower quadrant and upper pelvis. Findings could be secondary to partial or developing obstruction with ileus also a consideration. Electronically Signed   By: Jasmine PangKim  Fujinaga M.D.   On: 01/06/2017 19:44   Dg Abd 2 Views  Result Date: 01/09/2017 CLINICAL DATA:  Upper abdominal pain, nausea and intractable vomiting since 01/06/2017. EXAM: ABDOMEN - 2 VIEW COMPARISON:  Two views of the abdomen 01/07/2017. CT abdomen and pelvis 01/06/2017. FINDINGS: The bowel gas pattern is normal. There is no evidence of free air. No radio-opaque calculi or other significant radiographic abnormality is seen. Cholecystectomy clips noted. IMPRESSION: Normal exam. Electronically Signed   By: Drusilla Kannerhomas  Dalessio M.D.   On: 01/09/2017 12:50   Dg Abd 2 Views  Result Date: 01/07/2017 CLINICAL DATA:  Abdominal distention and nausea and vomiting. EXAM: ABDOMEN - 2 VIEW COMPARISON:  CT scan dated 01/06/2017 FINDINGS: The bowel gas pattern is now normal. No residual dilated loops of small bowel. There is air scattered throughout the nondistended colon. No fecal impaction or excessive stool in the colon. Stomach is not distended. No free air or free fluid. Bones are normal. Surgical clips consistent with previous cholecystectomy. Phleboliths in the pelvis. IMPRESSION: Benign-appearing abdomen.  No dilated bowel Electronically Signed   By: Francene BoyersJames  Maxwell M.D.   On: 01/07/2017 11:30    Gilda Abboud, DO  [facility] Pager [phone]  If 7PM-7AM, please contact night-coverage [email or web address] Password [email or web address] 01/09/2017, 5:54 PM   LOS: 3 days

## 2017-01-09 NOTE — Progress Notes (Signed)
Pt stated that it felt like her heart was beating fast/hard. B/p178/97 p 69. Pt wanted to take a shower to help relax, she stated that she took one earlier yesterday and it seemed to help. Iv site wrapped with a plastic bag and towels given to patein.  No dizziness noted.

## 2017-01-09 NOTE — Progress Notes (Signed)
Note patient vomited X 1, yellow/green in color.

## 2017-01-10 DIAGNOSIS — E876 Hypokalemia: Secondary | ICD-10-CM

## 2017-01-10 DIAGNOSIS — R1084 Generalized abdominal pain: Secondary | ICD-10-CM

## 2017-01-10 DIAGNOSIS — R112 Nausea with vomiting, unspecified: Secondary | ICD-10-CM

## 2017-01-10 LAB — URINE CULTURE

## 2017-01-10 LAB — COMPREHENSIVE METABOLIC PANEL
ALK PHOS: 54 U/L (ref 38–126)
ALT: 44 U/L (ref 14–54)
ANION GAP: 10 (ref 5–15)
AST: 24 U/L (ref 15–41)
Albumin: 3.8 g/dL (ref 3.5–5.0)
BILIRUBIN TOTAL: 1.2 mg/dL (ref 0.3–1.2)
BUN: 8 mg/dL (ref 6–20)
CHLORIDE: 102 mmol/L (ref 101–111)
CO2: 23 mmol/L (ref 22–32)
Calcium: 9 mg/dL (ref 8.9–10.3)
Creatinine, Ser: 0.75 mg/dL (ref 0.44–1.00)
GLUCOSE: 103 mg/dL — AB (ref 65–99)
POTASSIUM: 3.9 mmol/L (ref 3.5–5.1)
Sodium: 135 mmol/L (ref 135–145)
Total Protein: 7.2 g/dL (ref 6.5–8.1)

## 2017-01-10 LAB — CBC
HCT: 43.9 % (ref 36.0–46.0)
HEMOGLOBIN: 15 g/dL (ref 12.0–15.0)
MCH: 31.4 pg (ref 26.0–34.0)
MCHC: 34.2 g/dL (ref 30.0–36.0)
MCV: 91.8 fL (ref 78.0–100.0)
Platelets: 170 10*3/uL (ref 150–400)
RBC: 4.78 MIL/uL (ref 3.87–5.11)
RDW: 12.3 % (ref 11.5–15.5)
WBC: 10.4 10*3/uL (ref 4.0–10.5)

## 2017-01-10 LAB — MAGNESIUM: MAGNESIUM: 2 mg/dL (ref 1.7–2.4)

## 2017-01-10 MED ORDER — METOCLOPRAMIDE HCL 5 MG/ML IJ SOLN: 5.0000 mg | Freq: Three times a day (TID) | INTRAMUSCULAR | Status: DC

## 2017-01-10 MED ORDER — POLYETHYLENE GLYCOL 3350 17 G PO PACK
17.0000 g | PACK | Freq: Every day | ORAL | Status: DC
  Administered 2017-01-10 – 2017-01-11 (×2): 17 g via ORAL
  Filled 2017-01-10 (×3): qty 1

## 2017-01-10 MED ORDER — METOCLOPRAMIDE HCL 5 MG/ML IJ SOLN
10.0000 mg | Freq: Three times a day (TID) | INTRAMUSCULAR | Status: DC
  Administered 2017-01-10 – 2017-01-12 (×8): 10 mg via INTRAVENOUS
  Filled 2017-01-10 (×8): qty 2

## 2017-01-10 NOTE — Progress Notes (Signed)
PROGRESS NOTE  JEANELL MANGAN UJW:[id number] DOB: 03-04-1976 DOA: 01/06/2017 PCP: Patient, No Pcp Per  Brief History:  41 year old female with a history of anxiety/depression, tobacco abuse, history of polysubstance abuse presented with one-day history of abdominal pain and intractable vomiting. The patient denied any fevers, chills, chest pain, shortness breath, hematochezia, melena. CT of the abdomen and pelvis in the emergency department revealed few mildly dilated fluid-filled loops of bowel concerning for ileus versus early small bowel obstruction. General surgery was consulted to assist. They did not feel that the patient needed any surgical intervention. Ultimately, the patient's diet was advanced and she had a bowel movement on 01/08/2017. However she continues to complain of significant abdominal pain. She also continues to endorse numerous episodes of emesis although this cannot be verified and has not been seen with nursing staff.  Assessment/Plan: Partial small bowel obstruction -Appears to have improved clinically -01/07/2017 and 01/09/2017 abdominal x-rays negative for obstruction -Appreciate general surgery consult -Continue IV fluids  Intractable abdominal pain with vomiting -Suspect a degree of functional gastroparesis due to her opioids and acute medical illness -Minimize opioids -01/06/17 lipase 22 -Antiemetics -01/09/17 Gastric emptying study-->delayed gastric emptying -start metoclopramide -Continue PPI -Consult gastroenterology-->?gastritis/esophagitis -5/26--1 episode of emesis this am  Opioid seeking behavior -I have discussed with the patient that escalating her opioids can make her vomiting worse -I will not further escalate her opioids at this time unless there is objective evidence for worsening abdominal pain  Polysubstance abuse -Urine drug screen positive for cocaine and cannabis -Tobacco cessation discussed -drinks liquor&beer 3 times per  week -no signs of withdraw  Leukocytosis -Secondary to acute medical illness -Patient is afebrile and hemodynamically stable -improved without antibiotics -will not start any antibiotics -UA neg for pyuria  Depression/anxiety -Continue Celexa and home dose of Ativan -Continue Risperdal and trazodone  Hypokalemia -replete -check mag   Disposition Plan:   Home in 1-2 days  Family Communication:  spouse updated at bedside  Consultants:  GI, general surgery  Code Status:  FULL  DVT Prophylaxis:  Leando Lovenox   Procedures: As Listed in Progress Note Above  Antibiotics: None    Subjective: Patient states that she has one episode of emesis this morning. She states that her abdominal pain is a little bit better than yesterday. Denies any chest pain, short of breath, dysuria, hematuria. History is passing flatus without any bowel movement yet. No hematochezia or melena. No chest pain, shortness breath, fevers, chills, headache, neck pain.  Objective: Vitals:   01/09/17 0622 01/09/17 1300 01/09/17 2100 01/10/17 0618  BP: (!) 153/70 (!) 142/84 139/75 (!) 168/95  Pulse: 67 73 69 62  Resp: 18 18 18 18   Temp: 98.4 F (36.9 C) 98.6 F (37 C) 98.5 F (36.9 C) 98.3 F (36.8 C)  TempSrc: Oral Oral Oral Oral  SpO2: 99% 99% 98% 100%  Weight:      Height:        Intake/Output Summary (Last 24 hours) at 01/10/17 5621 Last data filed at 01/09/17 1600  Gross per 24 hour  Intake           251.25 ml  Output                1 ml  Net           250.25 ml   Weight change:  Exam:   General:  Pt is alert, follows commands appropriately, not in acute distress  HEENT: No icterus, No thrush, No neck mass, [place]/AT  Cardiovascular: RRR, S1/S2, no rubs, no gallops  Respiratory: CTA bilaterally, no wheezing, no crackles, no rhonchi  Abdomen: Soft/+BS,  Epigastric tendernesstender, non distended, no guarding  Extremities: No edema, No lymphangitis, No petechiae, No  rashes, no synovitis   Data Reviewed: I have personally reviewed following labs and imaging studies Basic Metabolic Panel:  Recent Labs Lab 01/06/17 1640 01/07/17 0525 01/08/17 0546 01/09/17 0427 01/10/17 0629  NA 137 140 138 134* 135  K 3.5 3.6 3.5 3.2* 3.9  CL 101 108 103 100* 102  CO2 22 23 28 25 23   GLUCOSE 127* 94 101* 112* 103*  BUN 14 9 5* 6 8  CREATININE 0.80 0.86 0.76 0.86 0.75  CALCIUM 9.9 8.2* 8.9 8.8* 9.0  MG 2.0  --   --   --  2.0   Liver Function Tests:  Recent Labs Lab 01/06/17 1640 01/10/17 0629  AST 26 24  ALT 33 44  ALKPHOS 64 54  BILITOT 1.2 1.2  PROT 9.0* 7.2  ALBUMIN 4.7 3.8    Recent Labs Lab 01/06/17 1640  LIPASE 25   No results for input(s): AMMONIA in the last 168 hours. Coagulation Profile: No results for input(s): INR, PROTIME in the last 168 hours. CBC:  Recent Labs Lab 01/06/17 1640 01/07/17 0525 01/08/17 0546 01/09/17 0427 01/10/17 0629  WBC 20.0* 13.6* 12.1* 11.6* 10.4  NEUTROABS  --   --  6.9 8.4*  --   HGB 17.0* 14.1 15.8* 14.1 15.0  HCT 47.6* 42.2 46.8* 41.5 43.9  MCV 93.5 95.0 94.5 93.3 91.8  PLT 232 167 178 148* 170   Cardiac Enzymes: No results for input(s): CKTOTAL, CKMB, CKMBINDEX, TROPONINI in the last 168 hours. BNP: Invalid input(s): POCBNP CBG: No results for input(s): GLUCAP in the last 168 hours. HbA1C: No results for input(s): HGBA1C in the last 72 hours. Urine analysis:    Component Value Date/Time   COLORURINE STRAW (A) 01/09/2017 1107   APPEARANCEUR CLEAR 01/09/2017 1107   LABSPEC 1.002 (L) 01/09/2017 1107   PHURINE 7.0 01/09/2017 1107   GLUCOSEU NEGATIVE 01/09/2017 1107   HGBUR SMALL (A) 01/09/2017 1107   BILIRUBINUR NEGATIVE 01/09/2017 1107   KETONESUR NEGATIVE 01/09/2017 1107   PROTEINUR NEGATIVE 01/09/2017 1107   UROBILINOGEN 0.2 06/19/2015 2330   NITRITE NEGATIVE 01/09/2017 1107   LEUKOCYTESUR NEGATIVE 01/09/2017 1107   Sepsis  Labs: @LABRCNTIP (procalcitonin:4,lacticidven:4) ) Recent Results (from the past 240 hour(s))  MRSA PCR Screening     Status: None   Collection Time: 01/06/17 10:59 PM  Result Value Ref Range Status   MRSA by PCR NEGATIVE NEGATIVE Final    Comment:        The GeneXpert MRSA Assay (FDA approved for NASAL specimens only), is one component of a comprehensive MRSA colonization surveillance program. It is not intended to diagnose MRSA infection nor to guide or monitor treatment for MRSA infections.      Scheduled Meds: . citalopram  20 mg Oral Daily  . enoxaparin (LOVENOX) injection  40 mg Subcutaneous Q24H  . LORazepam  1 mg Oral Q8H  . nicotine  21 mg Transdermal Daily  . pantoprazole (PROTONIX) IV  40 mg Intravenous Q12H  . risperiDONE  0.5 mg Oral QHS  . sucralfate  1 g Oral TID WC & HS  . traZODone  100 mg Oral QHS   Continuous Infusions: . 0.9 % NaCl with KCl 40 mEq / L 75 mL/hr (01/10/17 0530)  Procedures/Studies: Nm Gastric Emptying  Result Date: 01/09/2017 CLINICAL DATA:  Bloating and reflux with early satiety EXAM: NUCLEAR MEDICINE GASTRIC EMPTYING SCAN TECHNIQUE: After oral ingestion of radiolabeled meal, sequential abdominal images were obtained for 4 hours. Percentage of activity emptying the stomach was calculated at 1 hour, 2 hour, 3 hour, and 4 hours. RADIOPHARMACEUTICALS:  2.1 mCi Tc-82m sulfur colloid in standardized meal COMPARISON:  CT 01/06/2017 FINDINGS: Expected location of the stomach in the left upper quadrant. Ingested meal empties the stomach gradually over the course of the study. 8 % emptied at 1 hr ( normal >= 10%) 18% emptied at 2 hr ( normal >= 40%) 28% emptied at 3 hr ( normal >= 70%) 49% emptied at 4 hr ( normal >= 90%) IMPRESSION: Delayed gastric emptying study. Electronically Signed   By: Jasmine Pang M.D.   On: 01/09/2017 20:47   Ct Abdomen Pelvis W Contrast  Result Date: 01/06/2017 CLINICAL DATA:  Upper abdominal pain with nausea EXAM: CT  ABDOMEN AND PELVIS WITH CONTRAST TECHNIQUE: Multidetector CT imaging of the abdomen and pelvis was performed using the standard protocol following bolus administration of intravenous contrast. CONTRAST:  ISOVUE-300 IOPAMIDOL (ISOVUE-300) INJECTION 61% COMPARISON:  02/22/2011 FINDINGS: Lower chest: Lung bases demonstrate no acute consolidation or effusion. Normal heart size. Hepatobiliary: No focal liver abnormality is seen. Status post cholecystectomy. No biliary dilatation. Pancreas: Unremarkable. No pancreatic ductal dilatation or surrounding inflammatory changes. Spleen: Normal in size without focal abnormality. Adrenals/Urinary Tract: Adrenal glands are unremarkable. Kidneys are normal, without renal calculi, focal lesion, or hydronephrosis. Bladder is unremarkable. Stomach/Bowel: Stomach within normal limits. Dilated, fluid-filled proximal small bowel loops measuring up to 3 cm. Additional scattered borderline enlarged loops of small bowel in the upper pelvis and left lower quadrant. No colon wall thickening. Normal appendix. Vascular/Lymphatic: No significant vascular findings are present. No enlarged abdominal or pelvic lymph nodes. Reproductive: Uterus and bilateral adnexa are unremarkable. Other: No free air or free fluid.  Fat in the umbilicus. Musculoskeletal: No acute or suspicious bone lesion. IMPRESSION: 1. Few mildly dilated fluid-filled loops of small bowel in the upper abdomen, left lower quadrant and upper pelvis. Findings could be secondary to partial or developing obstruction with ileus also a consideration. Electronically Signed   By: Jasmine Pang M.D.   On: 01/06/2017 19:44   Dg Abd 2 Views  Result Date: 01/09/2017 CLINICAL DATA:  Upper abdominal pain, nausea and intractable vomiting since 01/06/2017. EXAM: ABDOMEN - 2 VIEW COMPARISON:  Two views of the abdomen 01/07/2017. CT abdomen and pelvis 01/06/2017. FINDINGS: The bowel gas pattern is normal. There is no evidence of free air.  No radio-opaque calculi or other significant radiographic abnormality is seen. Cholecystectomy clips noted. IMPRESSION: Normal exam. Electronically Signed   By: Drusilla Kanner M.D.   On: 01/09/2017 12:50   Dg Abd 2 Views  Result Date: 01/07/2017 CLINICAL DATA:  Abdominal distention and nausea and vomiting. EXAM: ABDOMEN - 2 VIEW COMPARISON:  CT scan dated 01/06/2017 FINDINGS: The bowel gas pattern is now normal. No residual dilated loops of small bowel. There is air scattered throughout the nondistended colon. No fecal impaction or excessive stool in the colon. Stomach is not distended. No free air or free fluid. Bones are normal. Surgical clips consistent with previous cholecystectomy. Phleboliths in the pelvis. IMPRESSION: Benign-appearing abdomen.  No dilated bowel Electronically Signed   By: Francene Boyers M.D.   On: 01/07/2017 11:30    Anarie Kalish, DO  [facility] Pager [phone]  If 7PM-7AM, please contact night-coverage [email or web address] Password [email or web address] 01/10/2017, 9:09 AM   LOS: 4 days

## 2017-01-10 NOTE — Consult Note (Signed)
Referring Provider: No ref. provider found Primary Care Physician:  Amanda Carrillo, No Pcp Per Primary Gastroenterologist:  none  Reason for Consultation:  Abdominal pain nausea and vomiting  HPI: 41 year old lady with polysubstance abuse admitted  4 days ago with nausea, vomiting, abdominal pain. Workup thus far has included a CT scan which revealed nonspecific small bowel dilation without obvious high-grade obstruction. No evidence of gastric outlet obstruction or peptic ulcer disease. Serum lipase normal. Seen by general surgery who did not feel that she had surgical problem. Gallbladder removed 2011. No obvious GI bleeding. Has been started on low-dose Reglan 5 mg IV. Takes ibuprofen multiple doses daily. Denies significant reflux symptoms have some vague epigastric pain. No melena or hematochezia. No hematemesis. Urine drug screen positive for opiates, THC and cocaine. Amanda Carrillo states that running hot water over her body alleviate her nausea to a significant degree. No prior history of the GI illnesses including peptic ulcer disease or family history of GI cancer.  She is tolerating low-dose Reglan without apparent side effects thus far.  Phase Gastric Emptying Study  this Admission Shows Delayed.    Past Medical History:  Diagnosis Date  . ADHD (attention deficit hyperactivity disorder)   . Anxiety   . Depression   . Polysubstance abuse    herion, methadone, etoh    Past Surgical History:  Procedure Laterality Date  . CHOLECYSTECTOMY    . TUBAL LIGATION    . TUBAL LIGATION      Prior to Admission medications   Medication Sig Start Date End Date Taking? Authorizing Provider  anti-nausea (EMETROL) solution Take 10 mLs by mouth every 15 (fifteen) minutes as needed for nausea or vomiting.   Yes [provider]  citalopram (CELEXA) 20 MG tablet Take 20 mg by mouth daily.   Yes [provider]  dimenhyDRINATE (DRAMAMINE) 50 MG tablet Take 50 mg by mouth every 8  (eight) hours as needed.   Yes [provider]  ibuprofen (ADVIL,MOTRIN) 200 MG tablet Take 400 mg by mouth every 6 (six) hours as needed (dental pain).    Yes [provider]  LORazepam (ATIVAN) 1 MG tablet Take 1 mg by mouth every 8 (eight) hours.   Yes [provider]  risperiDONE (RISPERDAL) 0.5 MG tablet Take 0.5 mg by mouth at bedtime.   Yes [provider]  simethicone (GAS RELIEF) 80 MG chewable tablet Chew 80 mg by mouth every 6 (six) hours as needed for flatulence.   Yes [provider]  traZODone (DESYREL) 100 MG tablet Take 100 mg by mouth at bedtime.   Yes [provider]    Current Facility-Administered Medications  Medication Dose Route Frequency Provider Last Rate Last Dose  . 0.9 % NaCl with KCl 40 mEq / L  infusion   Intravenous Continuous Tat, David, MD 75 mL/hr at 01/10/17 0530 75 mL/hr at 01/10/17 0530  . acetaminophen (TYLENOL) tablet 650 mg  650 mg Oral Q6H PRN Haydee Monica, MD      . alum & mag hydroxide-simeth (MAALOX/MYLANTA) 200-200-20 MG/5ML suspension 30 mL  30 mL Oral Q6H PRN Franky Macho, MD   30 mL at 01/07/17 1417  . citalopram (CELEXA) tablet 20 mg  20 mg Oral Daily Tarry Kos A, MD   20 mg at 01/09/17 0957  . diphenhydrAMINE (BENADRYL) injection 12.5 mg  12.5 mg Intravenous Q6H PRN Madelyn Flavors A, MD   12.5 mg at 01/09/17 0023  . enoxaparin (LOVENOX) injection 40 mg  40 mg Subcutaneous Q24H Tarry Kos A, MD   40 mg at 01/09/17 2323  . LORazepam (ATIVAN) tablet 1 mg  1 mg Oral Q8H Tarry Kos A, MD   1 mg at 01/09/17 2259  . metoCLOPramide (REGLAN) injection 5 mg  5 mg Intravenous TID AC & HS Tat, David, MD      . morphine 2 MG/ML injection 1 mg  1 mg Intravenous Q4H PRN Arrien, York Ram, MD   1 mg at 01/10/17 0530  . nicotine (NICODERM CQ - dosed in mg/24 hours) patch 21 mg  21 mg Transdermal Daily Arrien, York Ram, MD   21 mg at 01/08/17 2005  . ondansetron (ZOFRAN) tablet 4 mg  4  mg Oral Q6H PRN Haydee Monica, MD       Or  . ondansetron [facility]) injection 4 mg  4 mg Intravenous Q6H PRN Haydee Monica, MD   4 mg at 01/10/17 0530  . pantoprazole (PROTONIX) injection 40 mg  40 mg Intravenous Q12H Arrien, York Ram, MD   40 mg at 01/09/17 2259  . risperiDONE (RISPERDAL) tablet 0.5 mg  0.5 mg Oral QHS Tarry Kos A, MD   0.5 mg at 01/09/17 2259  . simethicone (MYLICON) chewable tablet 80 mg  80 mg Oral Q6H PRN Franky Macho, MD   80 mg at 01/07/17 1417  . sucralfate (CARAFATE) 1 GM/10ML suspension 1 g  1 g Oral TID WC & HS Arrien, York Ram, MD   1 g at 01/09/17 2315  . traZODone (DESYREL) tablet 100 mg  100 mg Oral QHS Tarry Kos A, MD   100 mg at 01/09/17 2259    Allergies as of 01/06/2017 - Review Complete 01/06/2017  Allergen Reaction Noted  . Toradol [ketorolac tromethamine] Hives and Nausea And Vomiting 06/19/2015    Family History  Problem Relation Age of Onset  . Pneumonia Other     Social History   Social History  . Marital status: Divorced    Spouse name: N/A  . Number of children: N/A  . Years of education: N/A   Occupational History  . Not on file.   Social History Main Topics  . Smoking status: Current Every Day Smoker    Packs/day: 1.00    Types: Cigarettes  . Smokeless tobacco: Never Used  . Alcohol use Yes     Comment: wine, liquor  . Drug use: No     Comment: herion (former per pt)  . Sexual activity: Yes    Birth control/ protection: Surgical   Other Topics Concern  . Not on file   Social History Narrative  . No narrative on file    Review of Systems:  As in history of present illness  Physical Exam: Vital signs in last 24 hours: Temp:  [98.3 F (36.8 C)-98.6 F (37 C)] 98.3 F (36.8 C) (05/26 0618) Pulse Rate:  [62-73] 62 (05/26 0618) Resp:  [18] 18 (05/26 0618) BP: (139-168)/(75-95) 168/95 (05/26 0618) SpO2:  [98 %-100 %] 100 % (05/26 0618) Last BM Date: 01/08/17 General:    pleasant and  cooperative in NAD. Multiple tattoos. Eyes:  Sclera clear, no icterus.   Conjunctiva pink. Lungs:  Clear throughout to auscultation.   No wheezes, crackles, or rhonchi. No acute distress. Heart:  Regular rate and rhythm; no murmurs, clicks, rubs,  or gallops. Abdomen:  Nondistended. Positive bowel sounds. No succussion splash. Minimal diffuse tenderness to palpation.   Intake/Output from previous day: 05/25 0701 - 05/26 0700  In: 251.3 [I.V.:251.3] Out: 2 [Urine:2] Intake/Output this shift: No intake/output data recorded.  Lab Results:  Recent Labs  01/08/17 0546 01/09/17 0427 01/10/17 0629  WBC 12.1* 11.6* 10.4  HGB 15.8* 14.1 15.0  HCT 46.8* 41.5 43.9  PLT 178 148* 170   BMET  Recent Labs  01/08/17 0546 01/09/17 0427 01/10/17 0629  NA 138 134* 135  K 3.5 3.2* 3.9  CL 103 100* 102  CO2 28 25 23   GLUCOSE 101* 112* 103*  BUN 5* 6 8  CREATININE 0.76 0.86 0.75  CALCIUM 8.9 8.8* 9.0   LFT  Recent Labs  01/10/17 0629  PROT 7.2  ALBUMIN 3.8  AST 24  ALT 44  ALKPHOS 54  BILITOT 1.2    Impression:  Nausea vomiting and abdominal pain in the setting of polysubstance abuse. Ileus type picture on cross-sectional imaging. Daily NSAID use.  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  Abnormally slow GES not surprising in this setting.  Moreover, she likely has an element of cannabinoid hyperemesis syndrome further complicating the clinical picture (Relief in symptoms with hot water exposure is a tip off to a component of this syndrome).  She is now reporting constipation.   Recommendations:  For the short run, will increase Reglan 10 mg IV before meals and at bedtime.  I admonished about potential side effects.  Add MiraLAX 17 g once daily.  Continue PPI therapy. Continue clear liquid diet.  Drug rehabilitation recommended and critically important as long-term abstinence will  have a major impact on her prognosis.  NSAID use also puts her at risk for NSAID gastropathy.  If no improvement in the next 24-48 hours might consider an EGD.                 Notice:  This dictation was prepared with Dragon dictation along with smaller phrase technology. Any transcriptional errors that result from this process are unintentional and may not be corrected upon review.

## 2017-01-11 DIAGNOSIS — G43A1 Cyclical vomiting, intractable: Secondary | ICD-10-CM

## 2017-01-11 LAB — BASIC METABOLIC PANEL
Anion gap: 7 (ref 5–15)
BUN: 8 mg/dL (ref 6–20)
CHLORIDE: 100 mmol/L — AB (ref 101–111)
CO2: 27 mmol/L (ref 22–32)
Calcium: 9.1 mg/dL (ref 8.9–10.3)
Creatinine, Ser: 0.81 mg/dL (ref 0.44–1.00)
GFR calc Af Amer: >60 mL/min (ref >60)
GFR calc non Af Amer: >60 mL/min (ref >60)
GLUCOSE: 96 mg/dL (ref 65–99)
POTASSIUM: 4 mmol/L (ref 3.5–5.1)
SODIUM: 134 mmol/L — AB (ref 135–145)

## 2017-01-11 LAB — MAGNESIUM: Magnesium: 2.1 mg/dL (ref 1.7–2.4)

## 2017-01-11 MED ORDER — BISACODYL 10 MG RE SUPP
10.0000 mg | Freq: Once | RECTAL | Status: AC
  Administered 2017-01-11: 10 mg via RECTAL
  Filled 2017-01-11: qty 1

## 2017-01-11 MED ORDER — SODIUM CHLORIDE 0.9 % IV SOLN
INTRAVENOUS | Status: DC
  Administered 2017-01-11 – 2017-01-12 (×2): via INTRAVENOUS
  Filled 2017-01-11 (×2): qty 1000

## 2017-01-11 NOTE — Progress Notes (Signed)
Patient up and about in her room. Tolerating clear liquids. Nausea improved. No vomiting. No bowel movement night.  No apparent side effects with Reglan.  Vital signs in last 24 hours: Temp:  [98.9 F (37.2 C)-99.1 F (37.3 C)] 98.9 F (37.2 C) (05/27 0620) Pulse Rate:  [61-81] 61 (05/27 0620) Resp:  [18] 18 (05/27 0620) BP: (155-175)/(86-89) 175/86 (05/27 0620) SpO2:  [98 %-99 %] 98 % (05/27 0620) Last BM Date: 01/07/17 General:   Alert,   pleasant and cooperative in NAD - looks pretty good. Abdomen:  Nondistended.  Normal bowel sounds, without guarding, and without rebound.  No mass or organomegaly. Extremities:  Without clubbing or edema.    Intake/Output from previous day: 05/26 0701 - 05/27 0700 In: 960 [P.O.:960] Out: -  Intake/Output this shift: Total I/O In: 360 [P.O.:360] Out: -   Lab Results:  Recent Labs  01/09/17 0427 01/10/17 0629  WBC 11.6* 10.4  HGB 14.1 15.0  HCT 41.5 43.9  PLT 148* 170   BMET  Recent Labs  01/09/17 0427 01/10/17 0629 01/11/17 0622  NA 134* 135 134*  K 3.2* 3.9 4.0  CL 100* 102 100*  CO2 25 23 27   GLUCOSE 112* 103* 96  BUN 6 8 8   CREATININE 0.86 0.75 0.81  CALCIUM 8.8* 9.0 9.1   LFT  Recent Labs  01/10/17 0629  PROT 7.2  ALBUMIN 3.8  AST 24  ALT 44  ALKPHOS 54  BILITOT 1.2   PT/INR No results for input(s): LABPROT, INR in the last 72 hours. Hepatitis Panel No results for input(s): HEPBSAG, HCVAB, HEPAIGM, HEPBIGM in the last 72 hours. C-Diff No results for input(s): CDIFFTOX in the last 72 hours.  Studies/Results: Nm Gastric Emptying  Result Date: 01/09/2017 CLINICAL DATA:  Bloating and reflux with early satiety EXAM: NUCLEAR MEDICINE GASTRIC EMPTYING SCAN TECHNIQUE: After oral ingestion of radiolabeled meal, sequential abdominal images were obtained for 4 hours. Percentage of activity emptying the stomach was calculated at 1 hour, 2 hour, 3 hour, and 4 hours. RADIOPHARMACEUTICALS:  2.1 mCi Tc-6254m sulfur  colloid in standardized meal COMPARISON:  CT 01/06/2017 FINDINGS: Expected location of the stomach in the left upper quadrant. Ingested meal empties the stomach gradually over the course of the study. 8 % emptied at 1 hr ( normal >= 10%) 18% emptied at 2 hr ( normal >= 40%) 28% emptied at 3 hr ( normal >= 70%) 49% emptied at 4 hr ( normal >= 90%) IMPRESSION: Delayed gastric emptying study. Electronically Signed   By: Jasmine PangKim  Fujinaga M.D.   On: 01/09/2017 20:47   Dg Abd 2 Views  Result Date: 01/09/2017 CLINICAL DATA:  Upper abdominal pain, nausea and intractable vomiting since 01/06/2017. EXAM: ABDOMEN - 2 VIEW COMPARISON:  Two views of the abdomen 01/07/2017. CT abdomen and pelvis 01/06/2017. FINDINGS: The bowel gas pattern is normal. There is no evidence of free air. No radio-opaque calculi or other significant radiographic abnormality is seen. Cholecystectomy clips noted. IMPRESSION: Normal exam. Electronically Signed   By: Drusilla Kannerhomas  Dalessio M.D.   On: 01/09/2017 12:50    Impression:  Nausea, vomiting, ileus, upper abdominal pain the setting of polysubstance abuse.   She looks better  Today.  Recommendations:  Advance to a full liquid diet. Continue current medical regimen. Reassess tomorrow.

## 2017-01-11 NOTE — Progress Notes (Signed)
PROGRESS NOTE  IVIS NICOLSON ZOX:[id number] DOB: 04-25-1976 DOA: 01/06/2017 PCP: Patient, No Pcp Per  Brief History: 41 year old female with a history of anxiety/depression, tobacco abuse, history of polysubstance abuse presented with one-day history of abdominal pain and intractable vomiting. The patient denied any fevers, chills, chest pain, shortness breath, hematochezia, melena. CT of the abdomen and pelvis in the emergency department revealed few mildly dilated fluid-filled loops of bowel concerning for ileus versus early small bowel obstruction. General surgery was consulted to assist. They did not feel that the patient needed any surgical intervention. Ultimately, the patient's diet was advanced and she had a bowel movement on 01/08/2017. However she continues to complain of significant abdominal pain. She also continues to endorse numerous episodes of emesis although this cannot be verified and has not been seen with nursing staff.  Assessment/Plan: Partial small bowel obstruction -Appears to have improved clinically -01/07/2017 and 01/09/2017 abdominal x-rays negative for obstruction -Appreciate general surgery consult -Continue IV fluids  Intractable abdominal pain with vomiting/Gastroparesis -Suspect a degree of functional gastroparesis due to her opioids, substance abuse and acute medical illness -also likely a component of cyclic vomiting syndrome due to [facility] -Minimize opioids -01/06/17 lipase 22 -Antiemetics -01/09/17 Gastric emptying study-->delayed gastric emptying -continue metoclopramide -Continue PPI -Appreciate GI follow up -5/27--advanced to full liquids  Opioid seeking behavior -I have discussed with the patient that escalating her opioids can make her vomiting worse -I will not further escalate her opioids at this time unless there is objective evidence for worsening abdominal pain  Polysubstance abuse -Urine drug screen positive for cocaine and  cannabis -Tobacco cessation discussed -drinks liquor&beer 3 times per week -no signs of withdraw  Leukocytosis -Secondary to acute medical illness -Patient is afebrile and hemodynamically stable -improved without antibiotics -will not start any antibiotics -UA neg for pyuria  Depression/anxiety -Continue Celexa and home dose of Ativan -Continue Risperdal and trazodone  Hypokalemia -replete -check mag   Disposition Plan: Home 5/28 if stable Family Communication: spouse updated at bedside  Consultants: GI, general surgery  Code Status: FULL  DVT Prophylaxis: Alliance Lovenox   Procedures: As Listed in Progress Note Above  Antibiotics: None   Subjective: Patient states the vomiting has improved today but she still feels nauseous she is able to tolerate liquids. Denies any fevers, chills, chest pain, short of breath, abdominal pain. She is passing gas without any bowel movement.  Objective: Vitals:   01/09/17 2100 01/10/17 0618 01/10/17 2100 01/11/17 0620  BP: 139/75 (!) 168/95 (!) 155/89 (!) 175/86  Pulse: 69 62 81 61  Resp: 18 18 18 18   Temp: 98.5 F (36.9 C) 98.3 F (36.8 C) 99.1 F (37.3 C) 98.9 F (37.2 C)  TempSrc: Oral Oral Oral Oral  SpO2: 98% 100% 99% 98%  Weight:      Height:        Intake/Output Summary (Last 24 hours) at 01/11/17 2017 Last data filed at 01/11/17 1900  Gross per 24 hour  Intake             1135 ml  Output                0 ml  Net             1135 ml   Weight change:  Exam:   General:  Pt is alert, follows commands appropriately, not in acute distress  HEENT: No icterus, No thrush, No neck mass, [place]/AT  Cardiovascular: RRR, S1/S2, no rubs, no gallops  Respiratory: CTA bilaterally, no wheezing, no crackles, no rhonchi  Abdomen: Soft/+BS, non tender, non distended, no guarding  Extremities: No edema, No lymphangitis, No petechiae, No rashes, no synovitis   Data Reviewed: I have personally reviewed  following labs and imaging studies Basic Metabolic Panel:  Recent Labs Lab 01/06/17 1640 01/07/17 0525 01/08/17 0546 01/09/17 0427 01/10/17 0629 01/11/17 0622  NA 137 140 138 134* 135 134*  K 3.5 3.6 3.5 3.2* 3.9 4.0  CL 101 108 103 100* 102 100*  CO2 22 23 28 25 23 27   GLUCOSE 127* 94 101* 112* 103* 96  BUN 14 9 5* 6 8 8   CREATININE 0.80 0.86 0.76 0.86 0.75 0.81  CALCIUM 9.9 8.2* 8.9 8.8* 9.0 9.1  MG 2.0  --   --   --  2.0 2.1   Liver Function Tests:  Recent Labs Lab 01/06/17 1640 01/10/17 0629  AST 26 24  ALT 33 44  ALKPHOS 64 54  BILITOT 1.2 1.2  PROT 9.0* 7.2  ALBUMIN 4.7 3.8    Recent Labs Lab 01/06/17 1640  LIPASE 25   No results for input(s): AMMONIA in the last 168 hours. Coagulation Profile: No results for input(s): INR, PROTIME in the last 168 hours. CBC:  Recent Labs Lab 01/06/17 1640 01/07/17 0525 01/08/17 0546 01/09/17 0427 01/10/17 0629  WBC 20.0* 13.6* 12.1* 11.6* 10.4  NEUTROABS  --   --  6.9 8.4*  --   HGB 17.0* 14.1 15.8* 14.1 15.0  HCT 47.6* 42.2 46.8* 41.5 43.9  MCV 93.5 95.0 94.5 93.3 91.8  PLT 232 167 178 148* 170   Cardiac Enzymes: No results for input(s): CKTOTAL, CKMB, CKMBINDEX, TROPONINI in the last 168 hours. BNP: Invalid input(s): POCBNP CBG: No results for input(s): GLUCAP in the last 168 hours. HbA1C: No results for input(s): HGBA1C in the last 72 hours. Urine analysis:    Component Value Date/Time   COLORURINE STRAW (A) 01/09/2017 1107   APPEARANCEUR CLEAR 01/09/2017 1107   LABSPEC 1.002 (L) 01/09/2017 1107   PHURINE 7.0 01/09/2017 1107   GLUCOSEU NEGATIVE 01/09/2017 1107   HGBUR SMALL (A) 01/09/2017 1107   BILIRUBINUR NEGATIVE 01/09/2017 1107   KETONESUR NEGATIVE 01/09/2017 1107   PROTEINUR NEGATIVE 01/09/2017 1107   UROBILINOGEN 0.2 06/19/2015 2330   NITRITE NEGATIVE 01/09/2017 1107   LEUKOCYTESUR NEGATIVE 01/09/2017 1107   Sepsis Labs: @LABRCNTIP (procalcitonin:4,lacticidven:4) ) Recent Results  (from the past 240 hour(s))  MRSA PCR Screening     Status: None   Collection Time: 01/06/17 10:59 PM  Result Value Ref Range Status   MRSA by PCR NEGATIVE NEGATIVE Final    Comment:        The GeneXpert MRSA Assay (FDA approved for NASAL specimens only), is one component of a comprehensive MRSA colonization surveillance program. It is not intended to diagnose MRSA infection nor to guide or monitor treatment for MRSA infections.   Culture, Urine     Status: Abnormal   Collection Time: 01/09/17 11:08 AM  Result Value Ref Range Status   Specimen Description URINE, CLEAN CATCH  Final   Special Requests NONE  Final   Culture (A)  Final    <10,000 COLONIES/mL INSIGNIFICANT GROWTH Performed at [facility], [address] [place], [place] [address]    Report Status 01/10/2017 FINAL  Final     Scheduled Meds: . citalopram  20 mg Oral Daily  . enoxaparin (LOVENOX) injection  40 mg Subcutaneous  Q24H  . LORazepam  1 mg Oral Q8H  . metoCLOPramide (REGLAN) injection  10 mg Intravenous TID AC & HS  . pantoprazole (PROTONIX) IV  40 mg Intravenous Q12H  . polyethylene glycol  17 g Oral Daily  . risperiDONE  0.5 mg Oral QHS  . sucralfate  1 g Oral TID WC & HS  . traZODone  100 mg Oral QHS   Continuous Infusions: . sodium chloride 0.9 % 1,000 mL with potassium chloride 20 mEq infusion 75 mL/hr at 01/11/17 1616    Procedures/Studies: Nm Gastric Emptying  Result Date: 01/09/2017 CLINICAL DATA:  Bloating and reflux with early satiety EXAM: NUCLEAR MEDICINE GASTRIC EMPTYING SCAN TECHNIQUE: After oral ingestion of radiolabeled meal, sequential abdominal images were obtained for 4 hours. Percentage of activity emptying the stomach was calculated at 1 hour, 2 hour, 3 hour, and 4 hours. RADIOPHARMACEUTICALS:  2.1 mCi Tc-62m sulfur colloid in standardized meal COMPARISON:  CT 01/06/2017 FINDINGS: Expected location of the stomach in the left upper quadrant. Ingested meal empties the stomach  gradually over the course of the study. 8 % emptied at 1 hr ( normal >= 10%) 18% emptied at 2 hr ( normal >= 40%) 28% emptied at 3 hr ( normal >= 70%) 49% emptied at 4 hr ( normal >= 90%) IMPRESSION: Delayed gastric emptying study. Electronically Signed   By: Jasmine Pang M.D.   On: 01/09/2017 20:47   Ct Abdomen Pelvis W Contrast  Result Date: 01/06/2017 CLINICAL DATA:  Upper abdominal pain with nausea EXAM: CT ABDOMEN AND PELVIS WITH CONTRAST TECHNIQUE: Multidetector CT imaging of the abdomen and pelvis was performed using the standard protocol following bolus administration of intravenous contrast. CONTRAST:  ISOVUE-300 IOPAMIDOL (ISOVUE-300) INJECTION 61% COMPARISON:  02/22/2011 FINDINGS: Lower chest: Lung bases demonstrate no acute consolidation or effusion. Normal heart size. Hepatobiliary: No focal liver abnormality is seen. Status post cholecystectomy. No biliary dilatation. Pancreas: Unremarkable. No pancreatic ductal dilatation or surrounding inflammatory changes. Spleen: Normal in size without focal abnormality. Adrenals/Urinary Tract: Adrenal glands are unremarkable. Kidneys are normal, without renal calculi, focal lesion, or hydronephrosis. Bladder is unremarkable. Stomach/Bowel: Stomach within normal limits. Dilated, fluid-filled proximal small bowel loops measuring up to 3 cm. Additional scattered borderline enlarged loops of small bowel in the upper pelvis and left lower quadrant. No colon wall thickening. Normal appendix. Vascular/Lymphatic: No significant vascular findings are present. No enlarged abdominal or pelvic lymph nodes. Reproductive: Uterus and bilateral adnexa are unremarkable. Other: No free air or free fluid.  Fat in the umbilicus. Musculoskeletal: No acute or suspicious bone lesion. IMPRESSION: 1. Few mildly dilated fluid-filled loops of small bowel in the upper abdomen, left lower quadrant and upper pelvis. Findings could be secondary to partial or developing obstruction  with ileus also a consideration. Electronically Signed   By: Jasmine Pang M.D.   On: 01/06/2017 19:44   Dg Abd 2 Views  Result Date: 01/09/2017 CLINICAL DATA:  Upper abdominal pain, nausea and intractable vomiting since 01/06/2017. EXAM: ABDOMEN - 2 VIEW COMPARISON:  Two views of the abdomen 01/07/2017. CT abdomen and pelvis 01/06/2017. FINDINGS: The bowel gas pattern is normal. There is no evidence of free air. No radio-opaque calculi or other significant radiographic abnormality is seen. Cholecystectomy clips noted. IMPRESSION: Normal exam. Electronically Signed   By: Drusilla Kanner M.D.   On: 01/09/2017 12:50   Dg Abd 2 Views  Result Date: 01/07/2017 CLINICAL DATA:  Abdominal distention and nausea and vomiting. EXAM: ABDOMEN - 2 VIEW COMPARISON:  CT scan dated 01/06/2017 FINDINGS: The bowel gas pattern is now normal. No residual dilated loops of small bowel. There is air scattered throughout the nondistended colon. No fecal impaction or excessive stool in the colon. Stomach is not distended. No free air or free fluid. Bones are normal. Surgical clips consistent with previous cholecystectomy. Phleboliths in the pelvis. IMPRESSION: Benign-appearing abdomen.  No dilated bowel Electronically Signed   By: Francene Boyers M.D.   On: 01/07/2017 11:30    Doretha Goding, DO  [facility] Pager [phone]  If 7PM-7AM, please contact night-coverage [email or web address] Password [email or web address] 01/11/2017, 8:17 PM   LOS: 5 days

## 2017-01-12 LAB — CBC
HCT: 42 % (ref 36.0–46.0)
Hemoglobin: 14.5 g/dL (ref 12.0–15.0)
MCH: 31.8 pg (ref 26.0–34.0)
MCHC: 34.5 g/dL (ref 30.0–36.0)
MCV: 92.1 fL (ref 78.0–100.0)
Platelets: 161 10*3/uL (ref 150–400)
RBC: 4.56 MIL/uL (ref 3.87–5.11)
RDW: 12.3 % (ref 11.5–15.5)
WBC: 9 10*3/uL (ref 4.0–10.5)

## 2017-01-12 LAB — BASIC METABOLIC PANEL
ANION GAP: 8 (ref 5–15)
BUN: 7 mg/dL (ref 6–20)
CHLORIDE: 101 mmol/L (ref 101–111)
CO2: 25 mmol/L (ref 22–32)
Calcium: 9 mg/dL (ref 8.9–10.3)
Creatinine, Ser: 0.85 mg/dL (ref 0.44–1.00)
GFR calc non Af Amer: >60 mL/min (ref >60)
Glucose, Bld: 102 mg/dL — ABNORMAL HIGH (ref 65–99)
Potassium: 3.8 mmol/L (ref 3.5–5.1)
Sodium: 134 mmol/L — ABNORMAL LOW (ref 135–145)

## 2017-01-12 MED ORDER — PANTOPRAZOLE SODIUM 40 MG PO TBEC: 40.0000 mg | DELAYED_RELEASE_TABLET | Freq: Every day | ORAL | Status: DC

## 2017-01-12 MED ORDER — PANTOPRAZOLE SODIUM 40 MG PO TBEC: 40.0000 mg | DELAYED_RELEASE_TABLET | Freq: Every day | ORAL | 0 refills | Status: DC

## 2017-01-12 MED ORDER — HYDROCODONE-ACETAMINOPHEN 5-325 MG PO TABS: 1.0000 | ORAL_TABLET | Freq: Four times a day (QID) | ORAL | 0 refills | Status: DC | PRN

## 2017-01-12 MED ORDER — METOCLOPRAMIDE HCL 5 MG PO TABS: 5.0000 mg | ORAL_TABLET | Freq: Three times a day (TID) | ORAL | 0 refills | Status: DC

## 2017-01-12 MED ORDER — POTASSIUM CHLORIDE IN NACL 20-0.9 MEQ/L-% IV SOLN
INTRAVENOUS | Status: DC
  Administered 2017-01-12: 10:00:00 via INTRAVENOUS

## 2017-01-12 MED ORDER — METOCLOPRAMIDE HCL 10 MG PO TABS: 5.0000 mg | ORAL_TABLET | Freq: Three times a day (TID) | ORAL | Status: DC

## 2017-01-12 NOTE — Discharge Summary (Addendum)
Physician Discharge Summary  Amanda Carrillo WUJ:[id number] DOB: July 31, 1976 DOA: 01/06/2017  PCP: Patient, No Pcp Per  Admit date: 01/06/2017 Discharge date: 01/12/2017  Admitted From: Home Disposition:  Home  Recommendations for Outpatient Follow-up:  1. Follow up with PCP in 1-2 weeks 2. Please obtain BMP/CBC in one week    Discharge Condition: Stable CODE STATUS:FULL Diet recommendation: Soft   Brief/Interim Summary: 41 year old female with a history of anxiety/depression, tobacco abuse, history of polysubstance abuse presented with one-day history of abdominal pain and intractable vomiting. The patient denied any fevers, chills, chest pain, shortness breath, hematochezia, melena. CT of the abdomen and pelvis in the emergency department revealed few mildly dilated fluid-filled loops of bowel concerning for ileus versus early small bowel obstruction. General surgery was consulted to assist. They did not feel that the patient needed any surgical intervention. Ultimately, the patient's diet was advanced and she had a bowel movement on 01/08/2017. However she continues to complain of significant abdominal pain. She also continues to endorse numerous episodes of emesis although this cannot be verified and has not been seen with nursing staff.  After discussion with the patient, she notified nursing staff of her emesis which was 1-2 episodes per day.  Repeat plain xrays of abd showed resolution of her PSBO. Gastric emptying study confirmed delayed gastric emptying. GI was consulted to assist.  Pt was started on reglan and PPI.  After a period bowel rest, her diet was advanced slowly.  She ultimately tolerated her diet.  She was told to avoid THC and cocaine.  Discharge Diagnoses:  Partial small bowel obstruction -Appears to have improved clinically -01/07/2017 and 01/09/2017 abdominal x-rays negative for obstruction -Appreciate general surgery consult -Continued IV fluids  Intractable  abdominal pain with vomiting/Gastroparesis -Suspect a degree of functional gastroparesis due to her opioids, substance abuse and acute medical illness -also likely a component of cyclic vomiting syndrome due to [facility] and gastritis from NSAIDS -Minimize opioids, avoid NSAIDS -home with norco #6 1 po q 6 hours prn pain, no RF -NCSRS was queried for past 12 months--no Rx for opioids -01/06/17 lipase 22 -Antiemetics -01/09/17 Gastric emptying study-->delayed gastric emptying -continue metoclopramide-->d/c home with reglan 5 mg ac&hs x 1 month -Continue PPI once daily x 1 month -Appreciate GI follow up -5/27--advanced to full liquids--tolerated  Opioid seeking behavior -I have discussed with the patient that escalating her opioids can make her vomiting worse -I will not further escalate her opioids at this time unless there is objective evidence for worsening abdominal pain  Polysubstance abuse -Urine drug screen positive for cocaine and cannabis -Tobacco cessation discussed -drinks liquor&beer 3 times per week -no signs of withdraw  Leukocytosis -Secondary to acute medical illness -Patient is afebrile and hemodynamically stable -improved without antibiotics -will not start any antibiotics -UA neg for pyuria  Depression/anxiety -Continue Celexa and home dose of Ativan -Continue Risperdal and trazodone  Hypokalemia -replete -check mag--2.1   Discharge Instructions  Discharge Instructions    Diet - low sodium heart healthy    Complete by:  As directed    Increase activity slowly    Complete by:  As directed      Allergies as of 01/12/2017      Reactions   Toradol [ketorolac Tromethamine] Hives, Nausea And Vomiting      Medication List    STOP taking these medications   ibuprofen 200 MG tablet Commonly known as:  ADVIL,MOTRIN     TAKE these medications   anti-nausea solution Take  10 mLs by mouth every 15 (fifteen) minutes as needed for nausea or vomiting.     citalopram 20 MG tablet Commonly known as:  CELEXA Take 20 mg by mouth daily.   dimenhyDRINATE 50 MG tablet Commonly known as:  DRAMAMINE Take 50 mg by mouth every 8 (eight) hours as needed.   GAS RELIEF 80 MG chewable tablet Generic drug:  simethicone Chew 80 mg by mouth every 6 (six) hours as needed for flatulence.   HYDROcodone-acetaminophen 5-325 MG tablet Commonly known as:  NORCO Take 1 tablet by mouth every 6 (six) hours as needed for moderate pain.   LORazepam 1 MG tablet Commonly known as:  ATIVAN Take 1 mg by mouth every 8 (eight) hours.   metoCLOPramide 5 MG tablet Commonly known as:  REGLAN Take 1 tablet (5 mg total) by mouth 4 (four) times daily -  before meals and at bedtime.   pantoprazole 40 MG tablet Commonly known as:  PROTONIX Take 1 tablet (40 mg total) by mouth daily. Start taking on:  01/13/2017   risperiDONE 0.5 MG tablet Commonly known as:  RISPERDAL Take 0.5 mg by mouth at bedtime.   traZODone 100 MG tablet Commonly known as:  DESYREL Take 100 mg by mouth at bedtime.       Allergies  Allergen Reactions  . Toradol [Ketorolac Tromethamine] Hives and Nausea And Vomiting    Consultations:  General surgery  GI--[facility]   Procedures/Studies: Nm Gastric Emptying  Result Date: 01/09/2017 CLINICAL DATA:  Bloating and reflux with early satiety EXAM: NUCLEAR MEDICINE GASTRIC EMPTYING SCAN TECHNIQUE: After oral ingestion of radiolabeled meal, sequential abdominal images were obtained for 4 hours. Percentage of activity emptying the stomach was calculated at 1 hour, 2 hour, 3 hour, and 4 hours. RADIOPHARMACEUTICALS:  2.1 mCi Tc-57m sulfur colloid in standardized meal COMPARISON:  CT 01/06/2017 FINDINGS: Expected location of the stomach in the left upper quadrant. Ingested meal empties the stomach gradually over the course of the study. 8 % emptied at 1 hr ( normal >= 10%) 18% emptied at 2 hr ( normal >= 40%) 28% emptied at 3 hr ( normal >= 70%) 49%  emptied at 4 hr ( normal >= 90%) IMPRESSION: Delayed gastric emptying study. Electronically Signed   By: Jasmine Pang M.D.   On: 01/09/2017 20:47   Ct Abdomen Pelvis W Contrast  Result Date: 01/06/2017 CLINICAL DATA:  Upper abdominal pain with nausea EXAM: CT ABDOMEN AND PELVIS WITH CONTRAST TECHNIQUE: Multidetector CT imaging of the abdomen and pelvis was performed using the standard protocol following bolus administration of intravenous contrast. CONTRAST:  ISOVUE-300 IOPAMIDOL (ISOVUE-300) INJECTION 61% COMPARISON:  02/22/2011 FINDINGS: Lower chest: Lung bases demonstrate no acute consolidation or effusion. Normal heart size. Hepatobiliary: No focal liver abnormality is seen. Status post cholecystectomy. No biliary dilatation. Pancreas: Unremarkable. No pancreatic ductal dilatation or surrounding inflammatory changes. Spleen: Normal in size without focal abnormality. Adrenals/Urinary Tract: Adrenal glands are unremarkable. Kidneys are normal, without renal calculi, focal lesion, or hydronephrosis. Bladder is unremarkable. Stomach/Bowel: Stomach within normal limits. Dilated, fluid-filled proximal small bowel loops measuring up to 3 cm. Additional scattered borderline enlarged loops of small bowel in the upper pelvis and left lower quadrant. No colon wall thickening. Normal appendix. Vascular/Lymphatic: No significant vascular findings are present. No enlarged abdominal or pelvic lymph nodes. Reproductive: Uterus and bilateral adnexa are unremarkable. Other: No free air or free fluid.  Fat in the umbilicus. Musculoskeletal: No acute or suspicious bone lesion. IMPRESSION: 1. Few mildly  dilated fluid-filled loops of small bowel in the upper abdomen, left lower quadrant and upper pelvis. Findings could be secondary to partial or developing obstruction with ileus also a consideration. Electronically Signed   By: Jasmine Pang M.D.   On: 01/06/2017 19:44   Dg Abd 2 Views  Result Date: 01/09/2017 CLINICAL  DATA:  Upper abdominal pain, nausea and intractable vomiting since 01/06/2017. EXAM: ABDOMEN - 2 VIEW COMPARISON:  Two views of the abdomen 01/07/2017. CT abdomen and pelvis 01/06/2017. FINDINGS: The bowel gas pattern is normal. There is no evidence of free air. No radio-opaque calculi or other significant radiographic abnormality is seen. Cholecystectomy clips noted. IMPRESSION: Normal exam. Electronically Signed   By: Drusilla Kanner M.D.   On: 01/09/2017 12:50   Dg Abd 2 Views  Result Date: 01/07/2017 CLINICAL DATA:  Abdominal distention and nausea and vomiting. EXAM: ABDOMEN - 2 VIEW COMPARISON:  CT scan dated 01/06/2017 FINDINGS: The bowel gas pattern is now normal. No residual dilated loops of small bowel. There is air scattered throughout the nondistended colon. No fecal impaction or excessive stool in the colon. Stomach is not distended. No free air or free fluid. Bones are normal. Surgical clips consistent with previous cholecystectomy. Phleboliths in the pelvis. IMPRESSION: Benign-appearing abdomen.  No dilated bowel Electronically Signed   By: Francene Boyers M.D.   On: 01/07/2017 11:30        Discharge Exam: Vitals:   01/11/17 2200 01/12/17 0639  BP: (!) 150/91 (!) 176/90  Pulse: 85 76  Resp: 18 20  Temp: 98.7 F (37.1 C) 98.7 F (37.1 C)   Vitals:   01/10/17 2100 01/11/17 0620 01/11/17 2200 01/12/17 0639  BP: (!) 155/89 (!) 175/86 (!) 150/91 (!) 176/90  Pulse: 81 61 85 76  Resp: 18 18 18 20   Temp: 99.1 F (37.3 C) 98.9 F (37.2 C) 98.7 F (37.1 C) 98.7 F (37.1 C)  TempSrc: Oral Oral Oral Oral  SpO2: 99% 98% 100% 100%  Weight:      Height:        General: Pt is alert, awake, not in acute distress Cardiovascular: RRR, S1/S2 +, no rubs, no gallops Respiratory: CTA bilaterally, no wheezing, no rhonchi Abdominal: Soft, NT, ND, bowel sounds + Extremities: no edema, no cyanosis   The results of significant diagnostics from this hospitalization (including imaging,  microbiology, ancillary and laboratory) are listed below for reference.    Significant Diagnostic Studies: Nm Gastric Emptying  Result Date: 01/09/2017 CLINICAL DATA:  Bloating and reflux with early satiety EXAM: NUCLEAR MEDICINE GASTRIC EMPTYING SCAN TECHNIQUE: After oral ingestion of radiolabeled meal, sequential abdominal images were obtained for 4 hours. Percentage of activity emptying the stomach was calculated at 1 hour, 2 hour, 3 hour, and 4 hours. RADIOPHARMACEUTICALS:  2.1 mCi Tc-58m sulfur colloid in standardized meal COMPARISON:  CT 01/06/2017 FINDINGS: Expected location of the stomach in the left upper quadrant. Ingested meal empties the stomach gradually over the course of the study. 8 % emptied at 1 hr ( normal >= 10%) 18% emptied at 2 hr ( normal >= 40%) 28% emptied at 3 hr ( normal >= 70%) 49% emptied at 4 hr ( normal >= 90%) IMPRESSION: Delayed gastric emptying study. Electronically Signed   By: Jasmine Pang M.D.   On: 01/09/2017 20:47   Ct Abdomen Pelvis W Contrast  Result Date: 01/06/2017 CLINICAL DATA:  Upper abdominal pain with nausea EXAM: CT ABDOMEN AND PELVIS WITH CONTRAST TECHNIQUE: Multidetector CT imaging of the abdomen  and pelvis was performed using the standard protocol following bolus administration of intravenous contrast. CONTRAST:  ISOVUE-300 IOPAMIDOL (ISOVUE-300) INJECTION 61% COMPARISON:  02/22/2011 FINDINGS: Lower chest: Lung bases demonstrate no acute consolidation or effusion. Normal heart size. Hepatobiliary: No focal liver abnormality is seen. Status post cholecystectomy. No biliary dilatation. Pancreas: Unremarkable. No pancreatic ductal dilatation or surrounding inflammatory changes. Spleen: Normal in size without focal abnormality. Adrenals/Urinary Tract: Adrenal glands are unremarkable. Kidneys are normal, without renal calculi, focal lesion, or hydronephrosis. Bladder is unremarkable. Stomach/Bowel: Stomach within normal limits. Dilated, fluid-filled  proximal small bowel loops measuring up to 3 cm. Additional scattered borderline enlarged loops of small bowel in the upper pelvis and left lower quadrant. No colon wall thickening. Normal appendix. Vascular/Lymphatic: No significant vascular findings are present. No enlarged abdominal or pelvic lymph nodes. Reproductive: Uterus and bilateral adnexa are unremarkable. Other: No free air or free fluid.  Fat in the umbilicus. Musculoskeletal: No acute or suspicious bone lesion. IMPRESSION: 1. Few mildly dilated fluid-filled loops of small bowel in the upper abdomen, left lower quadrant and upper pelvis. Findings could be secondary to partial or developing obstruction with ileus also a consideration. Electronically Signed   By: Jasmine Pang M.D.   On: 01/06/2017 19:44   Dg Abd 2 Views  Result Date: 01/09/2017 CLINICAL DATA:  Upper abdominal pain, nausea and intractable vomiting since 01/06/2017. EXAM: ABDOMEN - 2 VIEW COMPARISON:  Two views of the abdomen 01/07/2017. CT abdomen and pelvis 01/06/2017. FINDINGS: The bowel gas pattern is normal. There is no evidence of free air. No radio-opaque calculi or other significant radiographic abnormality is seen. Cholecystectomy clips noted. IMPRESSION: Normal exam. Electronically Signed   By: Drusilla Kanner M.D.   On: 01/09/2017 12:50   Dg Abd 2 Views  Result Date: 01/07/2017 CLINICAL DATA:  Abdominal distention and nausea and vomiting. EXAM: ABDOMEN - 2 VIEW COMPARISON:  CT scan dated 01/06/2017 FINDINGS: The bowel gas pattern is now normal. No residual dilated loops of small bowel. There is air scattered throughout the nondistended colon. No fecal impaction or excessive stool in the colon. Stomach is not distended. No free air or free fluid. Bones are normal. Surgical clips consistent with previous cholecystectomy. Phleboliths in the pelvis. IMPRESSION: Benign-appearing abdomen.  No dilated bowel Electronically Signed   By: Francene Boyers M.D.   On: 01/07/2017 11:30      Microbiology: Recent Results (from the past 240 hour(s))  MRSA PCR Screening     Status: None   Collection Time: 01/06/17 10:59 PM  Result Value Ref Range Status   MRSA by PCR NEGATIVE NEGATIVE Final    Comment:        The GeneXpert MRSA Assay (FDA approved for NASAL specimens only), is one component of a comprehensive MRSA colonization surveillance program. It is not intended to diagnose MRSA infection nor to guide or monitor treatment for MRSA infections.   Culture, Urine     Status: Abnormal   Collection Time: 01/09/17 11:08 AM  Result Value Ref Range Status   Specimen Description URINE, CLEAN CATCH  Final   Special Requests NONE  Final   Culture (A)  Final    <10,000 COLONIES/mL INSIGNIFICANT GROWTH Performed at [facility], [address], [place], [place] [address]    Report Status 01/10/2017 FINAL  Final     Labs: Basic Metabolic Panel:  Recent Labs Lab 01/06/17 1640  01/08/17 0546 01/09/17 0427 01/10/17 0629 01/11/17 0622 01/12/17 0629  NA 137  < >  138 134* 135 134* 134*  K 3.5  < > 3.5 3.2* 3.9 4.0 3.8  CL 101  < > 103 100* 102 100* 101  CO2 22  < > 28 25 23 27 25   GLUCOSE 127*  < > 101* 112* 103* 96 102*  BUN 14  < > 5* 6 8 8 7   CREATININE 0.80  < > 0.76 0.86 0.75 0.81 0.85  CALCIUM 9.9  < > 8.9 8.8* 9.0 9.1 9.0  MG 2.0  --   --   --  2.0 2.1  --   < > = values in this interval not displayed. Liver Function Tests:  Recent Labs Lab 01/06/17 1640 01/10/17 0629  AST 26 24  ALT 33 44  ALKPHOS 64 54  BILITOT 1.2 1.2  PROT 9.0* 7.2  ALBUMIN 4.7 3.8    Recent Labs Lab 01/06/17 1640  LIPASE 25   No results for input(s): AMMONIA in the last 168 hours. CBC:  Recent Labs Lab 01/07/17 0525 01/08/17 0546 01/09/17 0427 01/10/17 0629 01/12/17 0629  WBC 13.6* 12.1* 11.6* 10.4 9.0  NEUTROABS  --  6.9 8.4*  --   --   HGB 14.1 15.8* 14.1 15.0 14.5  HCT 42.2 46.8* 41.5 43.9 42.0  MCV 95.0 94.5 93.3 91.8 92.1  PLT 167 178 148*  170 161   Cardiac Enzymes: No results for input(s): CKTOTAL, CKMB, CKMBINDEX, TROPONINI in the last 168 hours. BNP: Invalid input(s): POCBNP CBG: No results for input(s): GLUCAP in the last 168 hours.  Time coordinating discharge:  Greater than 30 minutes  Signed:  Almon Whitford, DO [facility] Pager: [phone] 01/12/2017, 11:38 AM

## 2017-01-12 NOTE — Progress Notes (Signed)
  Patient ambulating in room. Wearing street clothes. Patient says nausea and abdominal pain improved.  Tolerating diet.  Vital signs in last 24 hours: Temp:  [98.7 F (37.1 C)] 98.7 F (37.1 C) (05/28 0639) Pulse Rate:  [76-85] 76 (05/28 0639) Resp:  [18-20] 20 (05/28 0639) BP: (150-176)/(90-91) 176/90 (05/28 0639) SpO2:  [100 %] 100 % (05/28 0639) Last BM Date: 01/11/17 General:   Alert,   pleasant and cooperative in NAD Abdomen:  Nondistended. Positive bowel sounds soft and nontender without appreciable mass or organomegaly  Extremities:  Without clubbing or edema.    Intake/Output from previous day: 05/27 0701 - 05/28 0700 In: 1885 [P.O.:1080; I.V.:805] Out: -  Intake/Output this shift: No intake/output data recorded.  Lab Results:  Recent Labs  01/10/17 0629 01/12/17 0629  WBC 10.4 9.0  HGB 15.0 14.5  HCT 43.9 42.0  PLT 170 161   BMET  Recent Labs  01/10/17 0629 01/11/17 0622 01/12/17 0629  NA 135 134* 134*  K 3.9 4.0 3.8  CL 102 100* 101  CO2 23 27 25   GLUCOSE 103* 96 102*  BUN 8 8 7   CREATININE 0.75 0.81 0.85  CALCIUM 9.0 9.1 9.0   LFT  Recent Labs  01/10/17 0629  PROT 7.2  ALBUMIN 3.8  AST 24  ALT 44  ALKPHOS 54  BILITOT 1.2      Impression:     Nausea, vomiting, abdominal pain -  much improved. Likely directly related to polysubstance abuse. No apparent side effects related to Reglan.    Recommendations:  Okay to discharge from a GI standpoint. Protonix 40 mg once daily should suffice.  Would discharge her on a brief course of a lesser dose of Reglan, ie  5 mg po before meals and at bedtime without any refills.  Importance of abstinence from illicit drugs reemphasized today.  I gave her my card. I have offered to see her back in the office in about 3-4 weeks from now.

## 2017-01-12 NOTE — Progress Notes (Signed)
.  rmr 

## 2017-01-13 ENCOUNTER — Encounter: Payer: Self-pay | Admitting: Internal Medicine

## 2017-01-16 DIAGNOSIS — K3184 Gastroparesis: Secondary | ICD-10-CM

## 2017-01-16 HISTORY — DX: Gastroparesis: K31.84

## 2017-02-17 ENCOUNTER — Ambulatory Visit: Payer: Self-pay | Admitting: Gastroenterology

## 2017-05-30 ENCOUNTER — Emergency Department (HOSPITAL_COMMUNITY)
Admission: EM | Admit: 2017-05-30 | Discharge: 2017-05-30 | Disposition: A | Discharge status: Home / Self Care | Payer: Self-pay | Attending: Emergency Medicine | Admitting: Emergency Medicine

## 2017-05-30 DIAGNOSIS — R1084 Generalized abdominal pain: Secondary | ICD-10-CM | POA: Insufficient documentation

## 2017-05-30 DIAGNOSIS — Z5321 Procedure and treatment not carried out due to patient leaving prior to being seen by health care provider: Secondary | ICD-10-CM | POA: Insufficient documentation

## 2017-05-30 NOTE — ED Triage Notes (Signed)
abd pain x 12 hours.  Hx of gastroparesis.  VSS per ems.

## 2017-05-30 NOTE — ED Notes (Signed)
Pt in triage- states she is leaving and going to [facility]. Pt reports her ride is on way, pt was brought in by EMS, pt has IV to right AC- IV removed, cath intact. Pt wants to leave prior to being triaged.

## 2017-06-07 ENCOUNTER — Emergency Department (HOSPITAL_COMMUNITY)
Admission: EM | Admit: 2017-06-07 | Discharge: 2017-06-07 | Disposition: A | Discharge status: Home / Self Care | Payer: Self-pay | Attending: Emergency Medicine | Admitting: Emergency Medicine

## 2017-06-07 ENCOUNTER — Encounter (HOSPITAL_COMMUNITY): Payer: Self-pay | Admitting: *Deleted

## 2017-06-07 ENCOUNTER — Emergency Department (HOSPITAL_COMMUNITY): Payer: Self-pay

## 2017-06-07 DIAGNOSIS — Z79899 Other long term (current) drug therapy: Secondary | ICD-10-CM | POA: Insufficient documentation

## 2017-06-07 DIAGNOSIS — R109 Unspecified abdominal pain: Secondary | ICD-10-CM

## 2017-06-07 DIAGNOSIS — F17210 Nicotine dependence, cigarettes, uncomplicated: Secondary | ICD-10-CM | POA: Insufficient documentation

## 2017-06-07 DIAGNOSIS — R197 Diarrhea, unspecified: Secondary | ICD-10-CM | POA: Insufficient documentation

## 2017-06-07 DIAGNOSIS — R112 Nausea with vomiting, unspecified: Secondary | ICD-10-CM | POA: Insufficient documentation

## 2017-06-07 LAB — CBC WITH DIFFERENTIAL/PLATELET
BASOS PCT: 0 %
Basophils Absolute: 0 10*3/uL (ref 0.0–0.1)
EOS PCT: 1 %
Eosinophils Absolute: 0.1 10*3/uL (ref 0.0–0.7)
HEMATOCRIT: 41.8 % (ref 36.0–46.0)
Hemoglobin: 14.4 g/dL (ref 12.0–15.0)
Lymphocytes Relative: 27 %
Lymphs Abs: 3.4 10*3/uL (ref 0.7–4.0)
MCH: 31 pg (ref 26.0–34.0)
MCHC: 34.4 g/dL (ref 30.0–36.0)
MCV: 89.9 fL (ref 78.0–100.0)
MONO ABS: 0.9 10*3/uL (ref 0.1–1.0)
MONOS PCT: 8 %
NEUTROS ABS: 7.9 10*3/uL — AB (ref 1.7–7.7)
Neutrophils Relative %: 64 %
Platelets: 184 10*3/uL (ref 150–400)
RBC: 4.65 MIL/uL (ref 3.87–5.11)
RDW: 12.5 % (ref 11.5–15.5)
WBC: 12.3 10*3/uL — ABNORMAL HIGH (ref 4.0–10.5)

## 2017-06-07 LAB — RAPID URINE DRUG SCREEN, HOSP PERFORMED
Amphetamines: NOT DETECTED
BENZODIAZEPINES: NOT DETECTED
Barbiturates: NOT DETECTED
COCAINE: NOT DETECTED
OPIATES: POSITIVE — AB
TETRAHYDROCANNABINOL: NOT DETECTED

## 2017-06-07 LAB — COMPREHENSIVE METABOLIC PANEL
ALBUMIN: 3.8 g/dL (ref 3.5–5.0)
ALT: 11 U/L — ABNORMAL LOW (ref 14–54)
AST: 11 U/L — ABNORMAL LOW (ref 15–41)
Alkaline Phosphatase: 50 U/L (ref 38–126)
Anion gap: 9 (ref 5–15)
BILIRUBIN TOTAL: 0.8 mg/dL (ref 0.3–1.2)
BUN: 7 mg/dL (ref 6–20)
CO2: 26 mmol/L (ref 22–32)
Calcium: 8.9 mg/dL (ref 8.9–10.3)
Chloride: 106 mmol/L (ref 101–111)
Creatinine, Ser: 0.92 mg/dL (ref 0.44–1.00)
GFR calc Af Amer: >60 mL/min (ref >60)
GFR calc non Af Amer: >60 mL/min (ref >60)
Glucose, Bld: 100 mg/dL — ABNORMAL HIGH (ref 65–99)
POTASSIUM: 2.9 mmol/L — AB (ref 3.5–5.1)
Sodium: 141 mmol/L (ref 135–145)
TOTAL PROTEIN: 7 g/dL (ref 6.5–8.1)

## 2017-06-07 LAB — URINALYSIS, ROUTINE W REFLEX MICROSCOPIC
BILIRUBIN URINE: NEGATIVE
Glucose, UA: NEGATIVE mg/dL
Ketones, ur: NEGATIVE mg/dL
NITRITE: NEGATIVE
Protein, ur: NEGATIVE mg/dL
SPECIFIC GRAVITY, URINE: 1.006 (ref 1.005–1.030)
pH: 6 (ref 5.0–8.0)

## 2017-06-07 LAB — LIPASE, BLOOD: LIPASE: 22 U/L (ref 11–51)

## 2017-06-07 LAB — I-STAT BETA HCG BLOOD, ED (MC, WL, AP ONLY)

## 2017-06-07 MED ORDER — METRONIDAZOLE 500 MG PO TABS: 500.0000 mg | ORAL_TABLET | Freq: Two times a day (BID) | ORAL | 0 refills | Status: DC

## 2017-06-07 MED ORDER — CIPROFLOXACIN IN D5W 400 MG/200ML IV SOLN: 400.0000 mg | Freq: Once | INTRAVENOUS | Status: DC

## 2017-06-07 MED ORDER — PANTOPRAZOLE SODIUM 20 MG PO TBEC: 20.0000 mg | DELAYED_RELEASE_TABLET | Freq: Every day | ORAL | 0 refills | Status: DC

## 2017-06-07 MED ORDER — CIPROFLOXACIN HCL 500 MG PO TABS: 500.0000 mg | ORAL_TABLET | Freq: Two times a day (BID) | ORAL | 0 refills | Status: DC

## 2017-06-07 MED ORDER — METRONIDAZOLE IN NACL 5-0.79 MG/ML-% IV SOLN: 500.0000 mg | Freq: Once | INTRAVENOUS | Status: DC

## 2017-06-07 MED ORDER — POTASSIUM CHLORIDE 10 MEQ/100ML IV SOLN
10.0000 meq | INTRAVENOUS | Status: AC
  Administered 2017-06-07: 10 meq via INTRAVENOUS
  Filled 2017-06-07: qty 100

## 2017-06-07 MED ORDER — CIPROFLOXACIN HCL 250 MG PO TABS
500.0000 mg | ORAL_TABLET | Freq: Once | ORAL | Status: AC
  Administered 2017-06-07: 500 mg via ORAL
  Filled 2017-06-07: qty 2

## 2017-06-07 MED ORDER — PANTOPRAZOLE SODIUM 40 MG IV SOLR
40.0000 mg | Freq: Once | INTRAVENOUS | Status: AC
  Administered 2017-06-07: 40 mg via INTRAVENOUS
  Filled 2017-06-07: qty 40

## 2017-06-07 MED ORDER — METRONIDAZOLE 500 MG PO TABS
500.0000 mg | ORAL_TABLET | Freq: Once | ORAL | Status: AC
Start: 2017-06-07 — End: 2017-06-07
  Administered 2017-06-07: 500 mg via ORAL
  Filled 2017-06-07: qty 1

## 2017-06-07 MED ORDER — METOCLOPRAMIDE HCL 5 MG PO TABS: 5.0000 mg | ORAL_TABLET | Freq: Four times a day (QID) | ORAL | 0 refills | Status: DC

## 2017-06-07 MED ORDER — METOCLOPRAMIDE HCL 5 MG/ML IJ SOLN
10.0000 mg | Freq: Once | INTRAMUSCULAR | Status: AC
  Administered 2017-06-07: 10 mg via INTRAVENOUS
  Filled 2017-06-07: qty 2

## 2017-06-07 MED ORDER — SODIUM CHLORIDE 0.9 % IV BOLUS (SEPSIS)
1000.0000 mL | Freq: Once | INTRAVENOUS | Status: AC
Start: 2017-06-07 — End: 2017-06-07
  Administered 2017-06-07: 1000 mL via INTRAVENOUS

## 2017-06-07 MED ORDER — POTASSIUM CHLORIDE CRYS ER 20 MEQ PO TBCR
40.0000 meq | EXTENDED_RELEASE_TABLET | Freq: Once | ORAL | Status: AC
  Administered 2017-06-07: 40 meq via ORAL
  Filled 2017-06-07: qty 2

## 2017-06-07 MED ORDER — DICYCLOMINE HCL 10 MG/ML IM SOLN
20.0000 mg | Freq: Once | INTRAMUSCULAR | Status: AC
  Administered 2017-06-07: 20 mg via INTRAMUSCULAR
  Filled 2017-06-07: qty 2

## 2017-06-07 MED ORDER — SODIUM CHLORIDE 0.9 % IV BOLUS (SEPSIS)
1000.0000 mL | Freq: Once | INTRAVENOUS | Status: AC
  Administered 2017-06-07: 1000 mL via INTRAVENOUS

## 2017-06-07 MED ORDER — IOPAMIDOL (ISOVUE-300) INJECTION 61%
100.0000 mL | Freq: Once | INTRAVENOUS | Status: AC | PRN
  Administered 2017-06-07: 100 mL via INTRAVENOUS

## 2017-06-07 MED ORDER — ONDANSETRON 4 MG PO TBDP: 4.0000 mg | ORAL_TABLET | Freq: Three times a day (TID) | ORAL | 0 refills | Status: DC | PRN

## 2017-06-07 MED ORDER — ONDANSETRON HCL 4 MG/2ML IJ SOLN
4.0000 mg | Freq: Once | INTRAMUSCULAR | Status: AC
  Administered 2017-06-07: 4 mg via INTRAVENOUS
  Filled 2017-06-07: qty 2

## 2017-06-07 NOTE — ED Triage Notes (Signed)
Pt c/o abdominal pain with n/v/d x 1 week 

## 2017-06-07 NOTE — ED Provider Notes (Signed)
[facility] Provider Note   CSN: [id number] Arrival date & time: 06/07/17  0230     History   Chief Complaint Chief Complaint  Patient presents with  . Abdominal Pain    HPI Amanda Carrillo is a [age] y.o. female.  Patient presents via EMS with a one-week history of nausea vomiting abdominal pain and diarrhea.  States this is similar to when she was admitted in May and diagnosed with a partial small bowel obstruction and gastroparesis.  States compliance with her PPI and Reglan.  Has not been able to eat or drink anything over the past week.  Has had vomiting about 8 times yesterday that was nonbloody and yellow and clear in color.  Associated with 3 episodes of loose stools.  Report fever of 103 several days ago which is since resolved.  She has blood in her urine but is on her menstrual cycle now.  Denies any vaginal bleeding or discharge.  Patient with previous cholecystectomy.  She admits to using ibuprofen on a regular basis.  She was supposed to have an EGD but did not get GI follow-up arranged.  She denies any chronic pain medication use.   The history is provided by the patient and the EMS personnel.    Past Medical History:  Diagnosis Date  . ADHD (attention deficit hyperactivity disorder)   . Anxiety   . Depression   . Polysubstance abuse (HCC)    herion, methadone, etoh    Patient Active Problem List   Diagnosis Date Noted  . Hypokalemia 01/09/2017  . Intractable vomiting   . Small bowel obstruction, partial (HCC) 01/06/2017  . Partial small bowel obstruction (HCC) 01/06/2017  . ADHD (attention deficit hyperactivity disorder)   . Anxiety   . Depression     Past Surgical History:  Procedure Laterality Date  . CHOLECYSTECTOMY    . TUBAL LIGATION    . TUBAL LIGATION      OB History    Gravida Para Term Preterm AB Living   3 2 2   1      SAB TAB Ectopic Multiple Live Births     1             Home Medications    Prior to Admission  medications   Medication Sig Start Date End Date Taking? Authorizing Provider  FLUoxetine (PROZAC) 10 MG tablet Take 25 mg by mouth daily.   Yes [provider]  LORazepam (ATIVAN) 1 MG tablet Take 1 mg by mouth every 8 (eight) hours.    [provider]  traZODone (DESYREL) 100 MG tablet Take 100 mg by mouth at bedtime.    [provider]    Family History Family History  Problem Relation Age of Onset  . Pneumonia Other     Social History Social History  Substance Use Topics  . Smoking status: Current Every Day Smoker    Packs/day: 0.50    Types: Cigarettes  . Smokeless tobacco: Never Used  . Alcohol use Yes     Comment: wine, liquor     Allergies   Toradol [ketorolac tromethamine]   Review of Systems Review of Systems  Constitutional: Positive for activity change, appetite change, chills, fatigue and fever.  Respiratory: Negative for cough, chest tightness and shortness of breath.   Cardiovascular: Negative for chest pain.  Gastrointestinal: Positive for abdominal pain, diarrhea, nausea and vomiting.  Genitourinary: Negative for dysuria, hematuria, vaginal bleeding and vaginal discharge.  Musculoskeletal: Positive for back  pain. Negative for arthralgias and myalgias.  Skin: Negative for rash.  Neurological: Negative for dizziness, weakness and headaches.    all other systems are negative except as noted in the HPI and PMH.    Physical Exam Updated Vital Signs BP (!) 130/98 (BP Location: Right Arm)   Pulse 76   Temp 98.1 F (36.7 C) (Oral)   Resp 12   Ht 5\' 9"  (1.753 m)   Wt 95.3 kg (210 lb)   LMP 06/06/2017   SpO2 98%   BMI 31.01 kg/m   Physical Exam  Constitutional: She is oriented to person, place, and time. She appears well-developed and well-nourished. No distress.  Dry mucus membranes  HENT:  Head: Normocephalic and atraumatic.  Mouth/Throat: Oropharynx is clear and moist. No oropharyngeal exudate.  Eyes: Pupils are  equal, round, and reactive to light. Conjunctivae and EOM are normal.  Neck: Normal range of motion. Neck supple.  No meningismus.  Cardiovascular: Normal rate, regular rhythm, normal heart sounds and intact distal pulses.   No murmur heard. Pulmonary/Chest: Effort normal and breath sounds normal. No respiratory distress.  Abdominal: Soft. There is tenderness. There is no rebound and no guarding.  Soft, mild diffuse tenderness, worse in epigastrium  Musculoskeletal: Normal range of motion. She exhibits tenderness. She exhibits no edema.  Paraspinal lumbar tenderness  Neurological: She is alert and oriented to person, place, and time. No cranial nerve deficit. She exhibits normal muscle tone. Coordination normal.   5/5 strength throughout. CN 2-12 intact.Equal grip strength.   Skin: Skin is warm.  Psychiatric: She has a normal mood and affect. Her behavior is normal.  Nursing note and vitals reviewed.    ED Treatments / Results  Labs (all labs ordered are listed, but only abnormal results are displayed) Labs Reviewed  URINALYSIS, ROUTINE W REFLEX MICROSCOPIC - Abnormal; Notable for the following:       Result Value   Hgb urine dipstick LARGE (*)    Leukocytes, UA TRACE (*)    Bacteria, UA RARE (*)    Squamous Epithelial / LPF 0-5 (*)    All other components within normal limits  RAPID URINE DRUG SCREEN, HOSP PERFORMED - Abnormal; Notable for the following:    Opiates POSITIVE (*)    All other components within normal limits  CBC WITH DIFFERENTIAL/PLATELET - Abnormal; Notable for the following:    WBC 12.3 (*)    Neutro Abs 7.9 (*)    All other components within normal limits  COMPREHENSIVE METABOLIC PANEL - Abnormal; Notable for the following:    Potassium 2.9 (*)    Glucose, Bld 100 (*)    AST 11 (*)    ALT 11 (*)    All other components within normal limits  LIPASE, BLOOD  I-STAT BETA HCG BLOOD, ED ([facility], [facility], [facility] ONLY)    EKG  EKG Interpretation None        Radiology Ct Abdomen Pelvis W Contrast  Result Date: 06/07/2017 CLINICAL DATA:  Abdominal pain, nausea, vomiting, and diarrhea for 1 week. EXAM: CT ABDOMEN AND PELVIS WITH CONTRAST TECHNIQUE: Multidetector CT imaging of the abdomen and pelvis was performed using the standard protocol following bolus administration of intravenous contrast. CONTRAST:  ISOVUE-300 IOPAMIDOL (ISOVUE-300) INJECTION 61% COMPARISON:  01/06/2017 FINDINGS: Lower chest: The lung bases are clear. Hepatobiliary: Appearance suggests hepatic cirrhosis with enlarged lateral segment left lobe of liver and nodular hepatic contour. Homogeneous parenchymal pattern. No focal lesions identified. Surgical absence of the gallbladder. No bile duct  dilatation. Pancreas: Unremarkable. No pancreatic ductal dilatation or surrounding inflammatory changes. Spleen: Normal in size without focal abnormality. Adrenals/Urinary Tract: Adrenal glands are unremarkable. Kidneys are normal, without renal calculi, focal lesion, or hydronephrosis. Bladder is unremarkable. Stomach/Bowel: Stomach and small bowel are mostly decompressed. Colon is not abnormally distended. There is mild wall thickening involving the ascending and transverse colon, possibly extending to the descending colon. In the setting of cirrhosis, this could represent portal venous hypertensive colopathy. Infectious or inflammatory colitis is not excluded. The appendix is normal. Vascular/Lymphatic: Normal caliber abdominal aorta. No aortic calcification. Retroperitoneal lymph nodes are moderately prominent, largest measuring up to about 7 mm in diameter. These are likely reactive and may be related to cirrhosis. No significant lymphadenopathy. Reproductive: Uterus and bilateral adnexa are unremarkable. Other: No free air or free fluid in the abdomen. Abdominal wall musculature appears intact. Subcutaneous emphysema over the left gluteal region likely due to injection site. Musculoskeletal:  No acute or significant osseous findings. IMPRESSION: 1. Appearance of the liver suggests hepatic cirrhosis. 2. Mild colonic wall thickening may reflect portal venous hypertensive cul lot the feet in the setting of cirrhosis. Infectious or inflammatory colitis is not excluded. 3. No evidence of bowel obstruction. 4. Mild prominence of retroperitoneal lymph nodes, likely reactive. Electronically Signed   By: Burman Nieves M.D.   On: 06/07/2017 05:23    Procedures Procedures (including critical care time)  Medications Ordered in ED Medications - No data to display   Initial Impression / Assessment and Plan / ED Course  I have reviewed the triage vital signs and the nursing notes.  Pertinent labs & imaging results that were available during my care of the patient were reviewed by me and considered in my medical decision making (see chart for details).    Patient with history of partial small bowel obstruction and gastroparesis presenting with a 1 week history of abdominal pain with vomiting and diarrhea.  Fever several days ago.  Presentation in May 2018 was thought to be due to substance abuse.  Abdomen is soft without peritoneal signs.  Patient will be given IV fluids, symptom control, labs and urine  Labs reassuring Mild leukocytosis and hypokalemia.  Drug screen positive for opiates which she is not prescribed and did not receive in the ED.  CT with colitis. No SBO. No abscess.  No vomiting or diarrhea in the ED.  She is tolerating p.o.  Abdomen is soft without peritoneal signs.  She is given antiemetics, Reglan, antibiotics, PPI and GI follow-up.  Return precautions discussed Final Clinical Impressions(s) / ED Diagnoses   Final diagnoses:  Nausea vomiting and diarrhea  Abdominal pain, unspecified abdominal location    New Prescriptions New Prescriptions   No medications on file     Glynn Octave, MD 06/07/17 [phone]

## 2017-06-07 NOTE — Discharge Instructions (Signed)
Take antibiotics as prescribed.  Follow-up with Dr. Dr. Jena Gaussourk.  Avoid alcohol, NSAIDs, caffeine, spicy foods.  Avoid using drugs.  Return to the ED if you develop new or worsening symptoms.

## 2017-06-15 ENCOUNTER — Emergency Department (HOSPITAL_COMMUNITY): Payer: Self-pay

## 2017-06-15 ENCOUNTER — Encounter (HOSPITAL_COMMUNITY): Payer: Self-pay | Admitting: *Deleted

## 2017-06-15 ENCOUNTER — Emergency Department (HOSPITAL_COMMUNITY)
Admission: EM | Admit: 2017-06-15 | Discharge: 2017-06-15 | Disposition: A | Discharge status: Home / Self Care | Payer: Self-pay | Attending: Emergency Medicine | Admitting: Emergency Medicine

## 2017-06-15 DIAGNOSIS — R0789 Other chest pain: Secondary | ICD-10-CM | POA: Insufficient documentation

## 2017-06-15 DIAGNOSIS — Z79899 Other long term (current) drug therapy: Secondary | ICD-10-CM | POA: Insufficient documentation

## 2017-06-15 DIAGNOSIS — F17210 Nicotine dependence, cigarettes, uncomplicated: Secondary | ICD-10-CM | POA: Insufficient documentation

## 2017-06-15 LAB — BASIC METABOLIC PANEL
ANION GAP: 8 (ref 5–15)
BUN: 7 mg/dL (ref 6–20)
CO2: 23 mmol/L (ref 22–32)
Calcium: 9.4 mg/dL (ref 8.9–10.3)
Chloride: 104 mmol/L (ref 101–111)
Creatinine, Ser: 0.7 mg/dL (ref 0.44–1.00)
Glucose, Bld: 141 mg/dL — ABNORMAL HIGH (ref 65–99)
POTASSIUM: 3.8 mmol/L (ref 3.5–5.1)
Sodium: 135 mmol/L (ref 135–145)

## 2017-06-15 LAB — CBC
HEMATOCRIT: 44.6 % (ref 36.0–46.0)
Hemoglobin: 14.7 g/dL (ref 12.0–15.0)
MCH: 30.9 pg (ref 26.0–34.0)
MCHC: 33 g/dL (ref 30.0–36.0)
MCV: 93.9 fL (ref 78.0–100.0)
Platelets: 157 10*3/uL (ref 150–400)
RBC: 4.75 MIL/uL (ref 3.87–5.11)
RDW: 12.9 % (ref 11.5–15.5)
WBC: 11.2 10*3/uL — AB (ref 4.0–10.5)

## 2017-06-15 LAB — TROPONIN I: Troponin I: <0.03 ng/mL (ref <0.03)

## 2017-06-15 LAB — D-DIMER, QUANTITATIVE (NOT AT ARMC): D DIMER QUANT: 0.38 ug{FEU}/mL (ref 0.00–0.50)

## 2017-06-15 MED ORDER — ETODOLAC 300 MG PO CAPS: 300.0000 mg | ORAL_CAPSULE | Freq: Three times a day (TID) | ORAL | 0 refills | Status: DC

## 2017-06-15 MED ORDER — ACETAMINOPHEN 500 MG PO TABS
1000.0000 mg | ORAL_TABLET | Freq: Once | ORAL | Status: AC
  Administered 2017-06-15: 1000 mg via ORAL
  Filled 2017-06-15: qty 2

## 2017-06-15 NOTE — ED Notes (Signed)
Patient transported to X-ray 

## 2017-06-15 NOTE — ED Triage Notes (Signed)
Pt c/o intermittent sharp pain in right back that radiates around under right breast and into mid chest x 3 days. Pt c/o increased pain with deep breathing, cough, yawning. Pt denies n/v, dizziness.

## 2017-06-15 NOTE — ED Provider Notes (Signed)
[facility] EMERGENCY DEPARTMENT Provider Note   CSN: [id number] Arrival date & time: 06/15/17  1117     History   Chief Complaint Chief Complaint  Patient presents with  . Chest Pain    HPI Amanda Carrillo is a 41 y.o. female.  The history is provided by the patient.  Chest Pain   This is a new problem. The current episode started more than 2 days ago. The problem occurs constantly. The problem has not changed since onset.The pain is present in the lateral region (right chest). The pain is moderate. The quality of the pain is described as stabbing and sharp. The pain radiates to the upper back. Exacerbated by: nothing. Pertinent negatives include no back pain, no cough, no fever and no shortness of breath. She has tried nothing for the symptoms. The treatment provided no relief. There are no known risk factors.  Pertinent negatives for past medical history include no CAD, no CHF, no DVT and no PE.    Past Medical History:  Diagnosis Date  . ADHD (attention deficit hyperactivity disorder)   . Anxiety   . Depression   . Polysubstance abuse (HCC)    herion, methadone, etoh    Patient Active Problem List   Diagnosis Date Noted  . Hypokalemia 01/09/2017  . Intractable vomiting   . Small bowel obstruction, partial (HCC) 01/06/2017  . Partial small bowel obstruction (HCC) 01/06/2017  . ADHD (attention deficit hyperactivity disorder)   . Anxiety   . Depression     Past Surgical History:  Procedure Laterality Date  . CHOLECYSTECTOMY    . TUBAL LIGATION    . TUBAL LIGATION      OB History    Gravida Para Term Preterm AB Living   3 2 2   1      SAB TAB Ectopic Multiple Live Births     1             Home Medications    Prior to Admission medications   Medication Sig Start Date End Date Taking? Authorizing Provider  ciprofloxacin (CIPRO) 500 MG tablet Take 1 tablet (500 mg total) by mouth 2 (two) times daily. 06/07/17  Yes Rancour, Jeannett Senior, MD  FLUoxetine (PROZAC)  10 MG tablet Take 25 mg by mouth daily.   Yes [provider]  LORazepam (ATIVAN) 1 MG tablet Take 1 mg by mouth every 8 (eight) hours.   Yes [provider]  metoCLOPramide (REGLAN) 5 MG tablet Take 1 tablet (5 mg total) by mouth every 6 (six) hours. 06/07/17  Yes Rancour, Jeannett Senior, MD  metroNIDAZOLE (FLAGYL) 500 MG tablet Take 1 tablet (500 mg total) by mouth 2 (two) times daily. 06/07/17  Yes Rancour, Jeannett Senior, MD  ondansetron (ZOFRAN ODT) 4 MG disintegrating tablet Take 1 tablet (4 mg total) by mouth every 8 (eight) hours as needed for nausea or vomiting. 06/07/17  Yes Rancour, Jeannett Senior, MD  pantoprazole (PROTONIX) 20 MG tablet Take 1 tablet (20 mg total) by mouth daily. 06/07/17  Yes Rancour, Jeannett Senior, MD  traZODone (DESYREL) 100 MG tablet Take 100 mg by mouth at bedtime.   Yes [provider]  etodolac (LODINE) 300 MG capsule Take 1 capsule (300 mg total) by mouth every 8 (eight) hours. 06/15/17   Linwood Dibbles, MD    Family History Family History  Problem Relation Age of Onset  . Pneumonia Other     Social History Social History  Substance Use Topics  . Smoking status: Current Every Day Smoker  Packs/day: 0.50    Types: Cigarettes  . Smokeless tobacco: Never Used  . Alcohol use No     Allergies   Toradol [ketorolac tromethamine]   Review of Systems Review of Systems  Constitutional: Negative for fever.  Respiratory: Negative for cough and shortness of breath.   Cardiovascular: Positive for chest pain.  Musculoskeletal: Negative for back pain.  All other systems reviewed and are negative.    Physical Exam Updated Vital Signs BP (!) 122/98 (BP Location: Left Arm)   Pulse 76   Temp 98.3 F (36.8 C) (Oral)   Resp 17   Ht 1.753 m (5\' 9" )   Wt 95.3 kg (210 lb)   LMP 06/06/2017   SpO2 99%   BMI 31.01 kg/m   Physical Exam  Constitutional: She appears well-developed and well-nourished. No distress.  HENT:  Head: Normocephalic and  atraumatic.  Right Ear: External ear normal.  Left Ear: External ear normal.  Eyes: Conjunctivae are normal. Right eye exhibits no discharge. Left eye exhibits no discharge. No scleral icterus.  Neck: Neck supple. No tracheal deviation present.  Cardiovascular: Normal rate, regular rhythm and intact distal pulses.   Pulmonary/Chest: Effort normal and breath sounds normal. No stridor. No respiratory distress. She has no wheezes. She has no rales. She exhibits tenderness.  Abdominal: Soft. Bowel sounds are normal. She exhibits no distension. There is no tenderness. There is no rebound and no guarding.  Musculoskeletal: She exhibits no edema or tenderness.  Neurological: She is alert. She has normal strength. No cranial nerve deficit (no facial droop, extraocular movements intact, no slurred speech) or sensory deficit. She exhibits normal muscle tone. She displays no seizure activity. Coordination normal.  Skin: Skin is warm and dry. No rash noted.  Psychiatric: She has a normal mood and affect.  Nursing note and vitals reviewed.    ED Treatments / Results  Labs (all labs ordered are listed, but only abnormal results are displayed) Labs Reviewed  BASIC METABOLIC PANEL - Abnormal; Notable for the following:       Result Value   Glucose, Bld 141 (*)    All other components within normal limits  CBC - Abnormal; Notable for the following:    WBC 11.2 (*)    All other components within normal limits  TROPONIN I  D-DIMER, QUANTITATIVE (NOT AT [facility])    EKG  EKG Interpretation  Date/Time:  Monday June 15 2017 11:31:31 EDT Ventricular Rate:  85 PR Interval:    QRS Duration: 90 QT Interval:  363 QTC Calculation: 432 R Axis:   75 Text Interpretation:  Sinus rhythm No old tracing to compare Confirmed by Linwood DibblesKnapp, Feliz Herard [phone]([id number]) on 06/15/2017 11:39:40 AM       Radiology Dg Chest 2 View  Result Date: 06/15/2017 CLINICAL DATA:  Intermittent sharp right back pain radiating anteriorly  under the right breast and into the mid chest for the past 3 days. The patient the pain has a pleuritic and positional component. Current smoker. EXAM: CHEST  2 VIEW COMPARISON:  Chest x-ray of July 28, 2013 FINDINGS: The lungs are adequately inflated and clear. The heart and pulmonary vascularity are normal. The mediastinum is normal in width. There is no pleural effusion or pneumothorax. The bony thorax is unremarkable. IMPRESSION: There is no acute cardiopulmonary abnormality. There are mild chronic bronchitic -smoking related changes. Electronically Signed   By: David  SwazilandJordan M.D.   On: 06/15/2017 12:10    Procedures Procedures (including critical care time)  Medications Ordered  in ED Medications  acetaminophen (TYLENOL) tablet 1,000 mg (1,000 mg Oral Given 06/15/17 1258)     Initial Impression / Assessment and Plan / ED Course  I have reviewed the triage vital signs and the nursing notes.  Pertinent labs & imaging results that were available during my care of the patient were reviewed by me and considered in my medical decision making (see chart for details).   Patient symptoms are atypical for cardiac chest pain.  Her ED workup is reassuring.  I doubt acute coronary syndrome.  Patient is having some sharp pleuritic pain.  She is low risk for PE.  D-dimer is negative.  I doubt pulmonary embolism.  Suspect viral pleuritic pain.  Patient has an allergy to Toradol however she states she can take other nonsteroidal anti-inflammatory medications.  Will discharge home with a prescription for Lodine.  Follow-up with a primary care doctor.  Final Clinical Impressions(s) / ED Diagnoses   Final diagnoses:  Atypical chest pain    New Prescriptions New Prescriptions   ETODOLAC (LODINE) 300 MG CAPSULE    Take 1 capsule (300 mg total) by mouth every 8 (eight) hours.     Linwood Dibbles, MD 06/15/17 1345

## 2017-06-15 NOTE — Discharge Instructions (Signed)
Follow-up with a primary care doctor next week if symptoms have not resolved.  Take the medications as needed for pain and discomfort

## 2017-06-30 ENCOUNTER — Emergency Department (HOSPITAL_COMMUNITY)
Admission: EM | Admit: 2017-06-30 | Discharge: 2017-06-30 | Disposition: A | Discharge status: Home / Self Care | Payer: Self-pay | Attending: Emergency Medicine | Admitting: Emergency Medicine

## 2017-06-30 ENCOUNTER — Other Ambulatory Visit: Payer: Self-pay

## 2017-06-30 ENCOUNTER — Encounter (HOSPITAL_COMMUNITY): Payer: Self-pay

## 2017-06-30 ENCOUNTER — Encounter: Payer: Self-pay | Admitting: Nurse Practitioner

## 2017-06-30 DIAGNOSIS — K649 Unspecified hemorrhoids: Secondary | ICD-10-CM | POA: Insufficient documentation

## 2017-06-30 DIAGNOSIS — F17210 Nicotine dependence, cigarettes, uncomplicated: Secondary | ICD-10-CM | POA: Insufficient documentation

## 2017-06-30 DIAGNOSIS — Z79899 Other long term (current) drug therapy: Secondary | ICD-10-CM | POA: Insufficient documentation

## 2017-06-30 DIAGNOSIS — K6289 Other specified diseases of anus and rectum: Secondary | ICD-10-CM

## 2017-06-30 LAB — CBC
HCT: 46.9 % — ABNORMAL HIGH (ref 36.0–46.0)
Hemoglobin: 15.9 g/dL — ABNORMAL HIGH (ref 12.0–15.0)
MCH: 31.6 pg (ref 26.0–34.0)
MCHC: 33.9 g/dL (ref 30.0–36.0)
MCV: 93.2 fL (ref 78.0–100.0)
PLATELETS: 163 10*3/uL (ref 150–400)
RBC: 5.03 MIL/uL (ref 3.87–5.11)
RDW: 13.3 % (ref 11.5–15.5)
WBC: 8.3 10*3/uL (ref 4.0–10.5)

## 2017-06-30 LAB — TYPE AND SCREEN
ABO/RH(D): A POS
Antibody Screen: NEGATIVE

## 2017-06-30 LAB — COMPREHENSIVE METABOLIC PANEL
ALBUMIN: 3.9 g/dL (ref 3.5–5.0)
ALK PHOS: 58 U/L (ref 38–126)
ALT: 10 U/L — AB (ref 14–54)
AST: 15 U/L (ref 15–41)
Anion gap: 8 (ref 5–15)
BUN: 10 mg/dL (ref 6–20)
CHLORIDE: 105 mmol/L (ref 101–111)
CO2: 22 mmol/L (ref 22–32)
CREATININE: 0.77 mg/dL (ref 0.44–1.00)
Calcium: 9.5 mg/dL (ref 8.9–10.3)
GFR calc non Af Amer: >60 mL/min (ref >60)
GLUCOSE: 88 mg/dL (ref 65–99)
Potassium: 4.1 mmol/L (ref 3.5–5.1)
SODIUM: 135 mmol/L (ref 135–145)
Total Bilirubin: 0.5 mg/dL (ref 0.3–1.2)
Total Protein: 7.3 g/dL (ref 6.5–8.1)

## 2017-06-30 LAB — ABO/RH: ABO/RH(D): A POS

## 2017-06-30 LAB — POC OCCULT BLOOD, ED: FECAL OCCULT BLD: NEGATIVE

## 2017-06-30 MED ORDER — HYDROCORTISONE ACETATE 25 MG RE SUPP: 25.0000 mg | Freq: Two times a day (BID) | RECTAL | 0 refills | Status: DC

## 2017-06-30 MED ORDER — LIDOCAINE HCL 2 % EX GEL
1.0000 "application " | Freq: Once | CUTANEOUS | Status: DC
  Filled 2017-06-30: qty 20

## 2017-06-30 NOTE — ED Provider Notes (Signed)
[facility]GENCY [facility]MENT Provider Note   CSN: [id number] Arrival date & time: 06/30/17  [id number]     History   Chief Complaint Chief Complaint  Patient presents with  . Rectal Bleeding    HPI Amanda Carrillo is a 41 y.o. female presenting with rectal pain for the last week. She reports a history of constipation and noticing blood on the tissue when she wipes. Pain is aggravated by pressure when seated and defecation. She describes it as a razor blade cutting sensation. She denies any bright red blood per rectum or in the toilet bowl. Last bowel movement was about 2 days ago and normal, nonbloody. She has been trying sitz baths, preparation H over the past week without relief. She denies any fever, chills, nausea, vomiting, abdominal pain or other symptoms. She was scheduled for follow-up with GI after her last emergency department visit 06/07/17 for colitis but was not able to be seen until after January 1st 2019.   HPI  Past Medical History:  Diagnosis Date  . ADHD (attention deficit hyperactivity disorder)   . Anxiety   . Depression   . Polysubstance abuse (HCC)    herion, methadone, etoh    Patient Active Problem List   Diagnosis Date Noted  . Hypokalemia 01/09/2017  . Intractable vomiting   . Small bowel obstruction, partial (HCC) 01/06/2017  . Partial small bowel obstruction (HCC) 01/06/2017  . ADHD (attention deficit hyperactivity disorder)   . Anxiety   . Depression     Past Surgical History:  Procedure Laterality Date  . CHOLECYSTECTOMY    . TUBAL LIGATION    . TUBAL LIGATION      OB History    Gravida Para Term Preterm AB Living   3 2 2   1      SAB TAB Ectopic Multiple Live Births     1             Home Medications    Prior to Admission medications   Medication Sig Start Date End Date Taking? Authorizing Provider  acetaminophen (TYLENOL) 500 MG tablet Take 1,000 mg every 6 (six) hours as needed by mouth for mild pain or  headache.   Yes [provider]  FLUoxetine (PROZAC) 10 MG tablet Take 20 mg daily by mouth.    Yes [provider]  LORazepam (ATIVAN) 1 MG tablet Take 1 mg by mouth every 8 (eight) hours.   Yes [provider]  metoCLOPramide (REGLAN) 5 MG tablet Take 1 tablet (5 mg total) by mouth every 6 (six) hours. 06/07/17  Yes Rancour, Jeannett SeniorStephen, MD  pantoprazole (PROTONIX) 20 MG tablet Take 1 tablet (20 mg total) by mouth daily. 06/07/17  Yes Rancour, Jeannett SeniorStephen, MD  shark liver oil-cocoa butter (PREPARATION H) 0.25-3-85.5 % suppository Place 1 suppository as needed rectally for hemorrhoids.   Yes [provider]  ciprofloxacin (CIPRO) 500 MG tablet Take 1 tablet (500 mg total) by mouth 2 (two) times daily. Patient not taking: Reported on 06/30/2017 06/07/17   Glynn Octaveancour, Stephen, MD  etodolac (LODINE) 300 MG capsule Take 1 capsule (300 mg total) by mouth every 8 (eight) hours. Patient not taking: Reported on 06/30/2017 06/15/17   Linwood DibblesKnapp, Jon, MD  hydrocortisone (ANUSOL-HC) 25 MG suppository Place 1 suppository (25 mg total) 2 (two) times daily rectally. 06/30/17   Georgiana ShoreMitchell, Kelisha Dall B, PA-C  metroNIDAZOLE (FLAGYL) 500 MG tablet Take 1 tablet (500 mg total) by mouth 2 (two) times daily. Patient not taking: Reported on  06/30/2017 06/07/17   Rancour, Jeannett SeniorStephen, MD  ondansetron (ZOFRAN ODT) 4 MG disintegrating tablet Take 1 tablet (4 mg total) by mouth every 8 (eight) hours as needed for nausea or vomiting. Patient not taking: Reported on 06/30/2017 06/07/17   Glynn Octaveancour, Stephen, MD    Family History Family History  Problem Relation Age of Onset  . Pneumonia Other     Social History Social History   Tobacco Use  . Smoking status: Current Every Day Smoker    Packs/day: 0.50    Types: Cigarettes  . Smokeless tobacco: Never Used  Substance Use Topics  . Alcohol use: No  . Drug use: No    Comment: herion (former per pt)     Allergies   Toradol [ketorolac  tromethamine]   Review of Systems Review of Systems  Constitutional: Negative for chills and fever.  Respiratory: Negative for cough, shortness of breath, wheezing and stridor.   Cardiovascular: Negative for chest pain and palpitations.  Gastrointestinal: Positive for anal bleeding, constipation and rectal pain. Negative for abdominal distention, abdominal pain, blood in stool, diarrhea, nausea and vomiting.  Genitourinary: Negative for difficulty urinating, dysuria, flank pain, frequency and hematuria.  Musculoskeletal: Negative for back pain.  Skin: Negative for color change, pallor and rash.  Neurological: Negative for dizziness, seizures, syncope and light-headedness.     Physical Exam Updated Vital Signs BP 137/88   Pulse 83   Temp 98.7 F (37.1 C) (Oral)   Resp 18   Ht 5\' 9"  (1.753 m)   Wt 99.8 kg (220 lb)   LMP 06/06/2017 (Approximate)   SpO2 99%   BMI 32.49 kg/m   Physical Exam  Constitutional: She appears well-developed and well-nourished. No distress.  Afebrile, nontoxic-appearing, lying comfortably in bed in no acute distress.  HENT:  Head: Normocephalic and atraumatic.  Eyes: Conjunctivae are normal.  Neck: Neck supple.  Cardiovascular: Normal rate, regular rhythm and normal heart sounds.  No murmur heard. Pulmonary/Chest: Effort normal and breath sounds normal. No stridor. No respiratory distress. She has no wheezes.  Abdominal: Soft. There is no tenderness.  Genitourinary:  Genitourinary Comments: External hemorrhoid non-thrombosed at 9 o'clock. Reducible. No fissure noted  Musculoskeletal: She exhibits no edema.  Neurological: She is alert.  Skin: Skin is warm and dry. No rash noted. She is not diaphoretic. No erythema. No pallor.  Psychiatric: She has a normal mood and affect.  Nursing note and vitals reviewed.    ED Treatments / Results  Labs (all labs ordered are listed, but only abnormal results are displayed) Labs Reviewed  COMPREHENSIVE  METABOLIC PANEL - Abnormal; Notable for the following components:      Result Value   ALT 10 (*)    All other components within normal limits  CBC - Abnormal; Notable for the following components:   Hemoglobin 15.9 (*)    HCT 46.9 (*)    All other components within normal limits  POC OCCULT BLOOD, ED  TYPE AND SCREEN  ABO/RH    EKG  EKG Interpretation None       Radiology No results found.  Procedures Procedures (including critical care time)  Medications Ordered in ED Medications  lidocaine (XYLOCAINE) 2 % jelly 1 application (not administered)     Initial Impression / Assessment and Plan / ED Course  I have reviewed the triage vital signs and the nursing notes.  Pertinent labs & imaging results that were available during my care of the patient were reviewed by me and considered in my  medical decision making (see chart for details).     Patient presenting with 1 week of rectal pain and external non--thrombosed hemorrhoid.  Patient's symptoms were managed in the ED and patient improved.  Patient is otherwise well-appearing, labs unremarkable, Hemoccult negative, normal vital signs and stable.  Patient's bleeding was only small amount on tissue. no bright red blood per rectum.  Discharge home with close follow-up with PCP. Advised hydration high-fiber, sitz bath, Anusol suppositories and donut cushion.  Discussed strict return precautions and advised to return to the emergency department if experiencing any new or worsening symptoms. Instructions were understood and patient agreed with discharge plan.  Final Clinical Impressions(s) / ED Diagnoses   Final diagnoses:  Hemorrhoids, unspecified hemorrhoid type    ED Discharge Orders        Ordered    hydrocortisone (ANUSOL-HC) 25 MG suppository  2 times daily     06/30/17 1244    Donut Cushion     06/30/17 1247       Gregary Cromer 06/30/17 1251    Jacalyn Lefevre, MD 06/30/17 1432

## 2017-06-30 NOTE — Discharge Instructions (Addendum)
As discussed, make sure you stay well-hydrated and take in plenty of fibers. Take sitz baths as often as possible throughout the day (4-5). Suppositories for pain relief. Follow up with your primary care provider or the wellness center in the next week. Return if you experience worsening, bleeding or other new concerning symptoms in the meantime.

## 2017-06-30 NOTE — ED Triage Notes (Signed)
Pt. Was diagnosed with colitis over 2 weeks ago and was placed on Antibiotic.  After completion of the antibiotic she developed rectal pain,with a hemorrhoid.  Mild bleeding noted. Rd and purple in color.    Using OTC  Medications with know relief.  Pt. Denies any fevers, n/v/d  Skin is p/w/d  Pt. Is alert and oriented X4

## 2017-08-03 ENCOUNTER — Telehealth: Payer: Self-pay | Admitting: *Deleted

## 2017-08-03 NOTE — Telephone Encounter (Signed)
Presents to [facility] asking to be seen in oud clinic, spoke to dr Oswaldo Donevincent, will see 12/18at 1115

## 2017-08-04 ENCOUNTER — Telehealth: Payer: Self-pay | Admitting: *Deleted

## 2017-08-04 ENCOUNTER — Telehealth: Payer: Self-pay | Admitting: General Practice

## 2017-08-04 ENCOUNTER — Encounter: Payer: Self-pay | Admitting: Student in an Organized Health Care Education/Training Program

## 2017-08-04 ENCOUNTER — Ambulatory Visit (INDEPENDENT_AMBULATORY_CARE_PROVIDER_SITE_OTHER): Payer: Self-pay | Admitting: Student in an Organized Health Care Education/Training Program

## 2017-08-04 ENCOUNTER — Other Ambulatory Visit: Payer: Self-pay

## 2017-08-04 VITALS — BP 132/80 | HR 74 | Temp 98.6°F | Resp 20 | Ht 70.0 in | Wt 204.4 lb

## 2017-08-04 DIAGNOSIS — F1920 Other psychoactive substance dependence, uncomplicated: Secondary | ICD-10-CM

## 2017-08-04 DIAGNOSIS — E049 Nontoxic goiter, unspecified: Secondary | ICD-10-CM

## 2017-08-04 DIAGNOSIS — Z972 Presence of dental prosthetic device (complete) (partial): Secondary | ICD-10-CM

## 2017-08-04 DIAGNOSIS — L818 Other specified disorders of pigmentation: Secondary | ICD-10-CM

## 2017-08-04 DIAGNOSIS — F329 Major depressive disorder, single episode, unspecified: Secondary | ICD-10-CM

## 2017-08-04 DIAGNOSIS — F1123 Opioid dependence with withdrawal: Secondary | ICD-10-CM

## 2017-08-04 DIAGNOSIS — F1199 Opioid use, unspecified with unspecified opioid-induced disorder: Secondary | ICD-10-CM

## 2017-08-04 DIAGNOSIS — F1190 Opioid use, unspecified, uncomplicated: Secondary | ICD-10-CM

## 2017-08-04 MED ORDER — BUPRENORPHINE HCL-NALOXONE HCL 8-2 MG SL FILM: 1.0000 | ORAL_FILM | Freq: Two times a day (BID) | SUBLINGUAL | 0 refills | Status: DC

## 2017-08-04 NOTE — Telephone Encounter (Signed)
Pt calls and states that [facility] did not have enough suboxone to fill her script, wants to know if new script can be sent to [facility] [address]? Have [facility] and verified, ask them to cancel script. Have changed pharmacy profile

## 2017-08-04 NOTE — Telephone Encounter (Signed)
Ok. I have reordered for new pharmacy.

## 2017-08-04 NOTE — Telephone Encounter (Signed)
[facility] [place] [place], CALL HER AT [phone]

## 2017-08-04 NOTE — Telephone Encounter (Signed)
done

## 2017-08-04 NOTE — Patient Instructions (Signed)

## 2017-08-04 NOTE — Progress Notes (Signed)
08/04/2017  41 year old woman came to our clinic today seeking treatment for opioid use disorder. The patient reports snorting heroin for the last 2 years and strongly desires to quit. She lives with her boyfriend who also uses heroin. She started using opioids in her early twenties. She went through an outpatient treatment program, no MAT, and reports being clean for about 10 years. She denies injection use for many years, only snorts crushed pills and heroin powder. She is not sure all the substances that come in the heroin anymore. She denies previous hospitalizations, surgeries, overdose, or infectious disease complications of drug use. She does endorse withdrawal symptoms if she does not use. She spends about $80 per day on heroin, and uses multiple times daily. She lives with her boyfriend's family, she takes care of his mother who is disabled from a CVA. She receives financial support from her boyfriend's father, not employed or insured. She is currently one year into a 2-year probation for misdemeanor financial card fraud. She reports "fooling" her probation urine drug testing by drinking large amounts of water prior to the test.   Last substance used: Heroin, 12/16 evening  Mental Health History: History of depression, [facility], no prior suicide attempts, not currently on treatment.   Current counseling/behavioural health provider: None  This patient has Severe Opioid Use Disorder by following DSM-V criteria: Keep those that apply.  - Opioids taken in larger amounts or over a longer period than intended - Persistent desire to cut down - A great deal of time is spent to obtain/use/recover from the opioid - Cravings to use opioids - Use resulting in a failure to fulfill major role obligations - Continue opioid use despite persistent social or interpersonal problems - Important activities are given up or reduced because of opioid use - Tolerance -  Withdrawal   Past Medical History:  Diagnosis Date  . ADHD (attention deficit hyperactivity disorder)   . Anxiety   . Depression   . Polysubstance abuse (HCC)    herion, methadone, etoh   Home Medications:  None currently  Family History:  Reviewed, no significant family history  Physical Exam  Vitals:   08/04/17 1106  BP: 132/80  Pulse: 74  Resp: 20  Temp: 98.6 F (37 C)  TempSrc: Oral  SpO2: 100%  Weight: 204 lb 6.4 oz (92.7 kg)  Height: 5\' 10"  (1.778 m)   Gen: Uncomfortable appearing woman, no distress ENT: upper dentures, partial lower dentures Neck:  Diffusely enlarged thyroid with no nodules or masses CV:  RRR, no murmurs Lungs:  Clear throughout, no wheezing Abd: soft, nontender Ext: Warm, well perfused, old injection sites on both arms, no SSI Neuro:  Alert, conversational, normal strength, normal gait Psych:  Well groomed, depressed affect, normal eye contact Skin: multiple professional tatoos, no rash   Clinical Opiate Withdrawal Scale: bold applicable COWS scoring   - Resting HR:    - 0 for < 80   - 1 for 81 - 100   - 2 for 101 - 120   - 4 for > 120  - Sweating:   - 0 for no chills/flushing   - 1 for subjective chills/flushing   - 3 for beads of sweat on brow/face   - 4 for sweat streaming off of face  - Restlessness:    - 0 for able to sit still   - 1 for subjective difficulty sitting still   - 3 for frequent  shifting or extraneous movement   - 5 for unable to sit still for more than a few seconds  - Pupil size:    - 0 for pinpoint or normal   - 1 for possibly larger than normal   - 2 for moderately dilated   - 5 for only iris rim visible  - Bone/joint pain:    - 0 for not present   - 1 for mild diffuse discomfort   - 2 severe diffuse aching   - 4 for objectively rubbing joints/muscles and obviously in pain  - Runny nose/tearing:    - 0 for not present   - 1 for stuffy nose/moist eyes   - 2 for nose running/tearing   - 4 for nose  constantly running or tears streaming down cheeks  - GI Upset:    - 0 for no GI symptoms   - 1 for stomach cramps   - 2 for nausea or loose stool   - 3 for vomiting or diarrhea   - 5 for multiple episodes of vomiting or diarrhea  - Tremor observation of outstretched hands:    - 0 for no tremor   - 1 for tremor can be felt but not observed   - 2 for slight tremor observable   - 4 for gross tremor or muscle twitching  - Yawning:    - 0 for no yawning   - 1 for yawning once or twice during assessment   - 2 for yawning three or more times during assessment   - 4 for yawning several times per minute  - Anxiety or irritability:    - 0 for none   - 1 for patient reports increasing irritability or anxiousness   - 2 for patient obviously irritable/anxious   - 4 for patient so irritable/anxious that assessment is difficult  - Gooseflesh:    - 0 for skin is smooth   - 3 for piloerection of skin can be felt or seen   - 5 for prominent piloerection  TOTAL: 12  Assessment/Plan:   Based on a review of the patient's medical history including substance use and mental health factors, and physical exam, Amanda Carrillo is a suitable candidate for MAT with buprenorphine/naloxone.  UDS ordered this visit.    HIV negative in 2016, needs repeat. CMP normal one month ago from ED visit.  Home Induction:   I have instructed the patient how to appropriately take this medication, including placing under the tongue with head relaxed for 10 minutes and allowing to dissolve without chewing or swallowing tab/film, and with nothing to eat or drink in the subsequent 15 minutes.  They have been told not to start taking the medication until they have significant signs of withdrawal and I have explained the concept of precipitated withdrawal with the patient.    Intervisit Care:  I will call the patient the morning after induction to assess symptom burden and determine the need for additional dose titration.  We  discussed this medication must be kept in a safe place and away from children.   We will see the patient back in 2 weeks in clinic, with options for a sooner appointment based on patient and provider preference.   Patient was encouraged to call the office and speak with the MD on call for any urgent concerns.

## 2017-08-04 NOTE — Assessment & Plan Note (Signed)
Severe Opioid Use Disorder with dependence on heroin products. I think she qualifies for a trial of medication assisted treatment. We talked about risks and benefits to Buprenorphine and Methadone. She is requesting Bupe induction which I think is appropriate. Will start with Suboxone 8mg , two films daily. I gave her instructions about home induction: 4mg  this afternoon followed by another 4mg  one hour later, then 8mg  BID starting tomorrow morning. She is currently in moderate withdrawal, she understands the risk of precipitated withdrawal. We will submit an application for Suboxone assistance with the pharmaceutical company, and we gave her a coupon for today's prescription. Urine tox obtained today, she is high risk for trying to manipulate urine tests given her history of manipulating probation testing. I looked at the database which shows only Ativan prescriptions that stopped in September. Clinic is closed most of next week for the winter holidays, so I have given her a two week supply, she will return on 1/2 for follow up. I will call her tomorrow to check in with her induction.

## 2017-08-06 ENCOUNTER — Telehealth: Payer: Self-pay | Admitting: General Practice

## 2017-08-06 NOTE — Telephone Encounter (Signed)
Pt was advised to throw all packaging away. She states she feels a "lot better", advised her that I would pass this on to dr Oswaldo Donevincent

## 2017-08-06 NOTE — Telephone Encounter (Signed)
PATIENT CALLED IS DOING GOOD ON NEW MEDICATION SUVOXONE, PATIENT WANTS TO KNOW IF SHE SHOULD KEEP THE CONTAINERS THE MEDICATION IS SHIPPED IN.

## 2017-08-07 ENCOUNTER — Encounter: Payer: Self-pay | Admitting: Pharmacist

## 2017-08-07 DIAGNOSIS — F1120 Opioid dependence, uncomplicated: Secondary | ICD-10-CM | POA: Insufficient documentation

## 2017-08-08 LAB — TOXASSURE SELECT,+ANTIDEPR,UR

## 2017-08-13 ENCOUNTER — Ambulatory Visit: Payer: Self-pay | Admitting: Nurse Practitioner

## 2017-08-13 ENCOUNTER — Telehealth: Payer: Self-pay | Admitting: Internal Medicine

## 2017-08-13 ENCOUNTER — Encounter: Payer: Self-pay | Admitting: Internal Medicine

## 2017-08-13 NOTE — Telephone Encounter (Signed)
Patient was a no show and letter sent  °

## 2017-08-13 NOTE — Telephone Encounter (Signed)
Noted  

## 2017-08-14 ENCOUNTER — Other Ambulatory Visit: Payer: Self-pay

## 2017-08-14 MED ORDER — BUPRENORPHINE HCL-NALOXONE HCL 8-2 MG SL FILM: 1.0000 | ORAL_FILM | Freq: Two times a day (BID) | SUBLINGUAL | 0 refills | Status: DC

## 2017-08-14 NOTE — Telephone Encounter (Signed)
Spoke with Amanda Carrillo, she is doing well on Suboxone, family plans have changed and she has a family vacation planned, will be out till 1/4. She and her significant other have a court date planned for 1/8 and will not be able to make that appointment.  I discussed we will need her to reliabbly come to her appointments,  However will try to be accommodating this time.  [facility]  Will Rx 1 additional week supply of suboxone.

## 2017-08-14 NOTE — Telephone Encounter (Signed)
Pt states she and her family will out of town on the 2nd to celebrate Christmas (her side of the family).

## 2017-08-14 NOTE — Telephone Encounter (Signed)
Would like suboxone to be filled @ [facility] [address].

## 2017-08-14 NOTE — Telephone Encounter (Signed)
[facility] [place] - they do not have enough to refill her rx. Her Boyfriend had called  - stated [facility].

## 2017-08-14 NOTE — Telephone Encounter (Signed)
From review of Dr Vincent's note on 12/18 as well as the PMP database, she filled a 2 week prescription 10 days ago, she has a follow up visit scheduled for 1/2, why does she need the refill today?

## 2017-08-14 NOTE — Telephone Encounter (Signed)
Buprenorphine HCl-Naloxone HCl (SUBOXONE) 8-2 MG FILM, refill request. Pt states she will be out of town this whole week, would like the med by today. Please call pt back.

## 2017-08-14 NOTE — Telephone Encounter (Signed)
Thanks

## 2017-08-19 ENCOUNTER — Ambulatory Visit: Payer: Self-pay

## 2017-08-25 ENCOUNTER — Ambulatory Visit: Payer: Self-pay

## 2017-08-26 ENCOUNTER — Ambulatory Visit: Payer: Self-pay

## 2017-08-26 ENCOUNTER — Other Ambulatory Visit: Payer: Self-pay | Admitting: General Practice

## 2017-08-26 NOTE — Telephone Encounter (Signed)
Pt's significant other had death in his family and pt had to cancel OUD appt.  Appt has been rescheduled to next Tues 1/15.  Pt requesting rx for Buprenorphine HCl-Naloxone HCl (SUBOXONE) 8-2 MG FILM.  Will send to MDs (Mullen/Vincent) for consideration, please advise.Criss AlvineGoldston, Ciria Bernardini Cassady1/9/201910:31 AM

## 2017-08-26 NOTE — Telephone Encounter (Signed)
Patient would like to speak to someone about medicine

## 2017-08-26 NOTE — Telephone Encounter (Signed)
I am going to deny this refill request. We induced her 12/18, then she missed her 1/2 appointment with us, and now a second no show yesterday. We will not refill Bupe without a follow up visit. [facility] or tomorrow, I will be here. If she is unable to follow up with us regularly, she will not be able to continue with Bupe treatment.

## 2017-08-26 NOTE — Telephone Encounter (Signed)
Patient is calling back  °

## 2017-08-26 NOTE — Telephone Encounter (Signed)
Spoke w/ pt she will come to acc tomorrow sch [facility], her sig other will call tomorrow and make appt

## 2017-08-27 ENCOUNTER — Ambulatory Visit: Payer: Self-pay

## 2017-08-28 ENCOUNTER — Encounter: Payer: Self-pay | Admitting: Student in an Organized Health Care Education/Training Program

## 2017-08-31 ENCOUNTER — Emergency Department (HOSPITAL_COMMUNITY): Payer: Self-pay

## 2017-08-31 ENCOUNTER — Emergency Department (HOSPITAL_COMMUNITY)
Admission: EM | Admit: 2017-08-31 | Discharge: 2017-08-31 | Disposition: A | Discharge status: Home / Self Care | Payer: Self-pay | Attending: Emergency Medicine | Admitting: Emergency Medicine

## 2017-08-31 ENCOUNTER — Encounter (HOSPITAL_COMMUNITY): Payer: Self-pay | Admitting: *Deleted

## 2017-08-31 ENCOUNTER — Other Ambulatory Visit: Payer: Self-pay

## 2017-08-31 DIAGNOSIS — F17210 Nicotine dependence, cigarettes, uncomplicated: Secondary | ICD-10-CM | POA: Insufficient documentation

## 2017-08-31 DIAGNOSIS — R112 Nausea with vomiting, unspecified: Secondary | ICD-10-CM | POA: Insufficient documentation

## 2017-08-31 DIAGNOSIS — R1084 Generalized abdominal pain: Secondary | ICD-10-CM | POA: Insufficient documentation

## 2017-08-31 DIAGNOSIS — Z79899 Other long term (current) drug therapy: Secondary | ICD-10-CM | POA: Insufficient documentation

## 2017-08-31 DIAGNOSIS — F909 Attention-deficit hyperactivity disorder, unspecified type: Secondary | ICD-10-CM | POA: Insufficient documentation

## 2017-08-31 LAB — CBC WITH DIFFERENTIAL/PLATELET
Basophils Absolute: 0 10*3/uL (ref 0.0–0.1)
Basophils Relative: 0 %
EOS ABS: 0 10*3/uL (ref 0.0–0.7)
EOS PCT: 0 %
HCT: 44.1 % (ref 36.0–46.0)
Hemoglobin: 15.1 g/dL — ABNORMAL HIGH (ref 12.0–15.0)
LYMPHS ABS: 3 10*3/uL (ref 0.7–4.0)
Lymphocytes Relative: 22 %
MCH: 30.9 pg (ref 26.0–34.0)
MCHC: 34.2 g/dL (ref 30.0–36.0)
MCV: 90.2 fL (ref 78.0–100.0)
Monocytes Absolute: 0.9 10*3/uL (ref 0.1–1.0)
Monocytes Relative: 7 %
Neutro Abs: 9.8 10*3/uL — ABNORMAL HIGH (ref 1.7–7.7)
Neutrophils Relative %: 71 %
Platelets: 229 10*3/uL (ref 150–400)
RBC: 4.89 MIL/uL (ref 3.87–5.11)
RDW: 12.8 % (ref 11.5–15.5)
WBC: 13.7 10*3/uL — AB (ref 4.0–10.5)

## 2017-08-31 LAB — COMPREHENSIVE METABOLIC PANEL
ALT: 21 U/L (ref 14–54)
ANION GAP: 14 (ref 5–15)
AST: 17 U/L (ref 15–41)
Albumin: 4.6 g/dL (ref 3.5–5.0)
Alkaline Phosphatase: 60 U/L (ref 38–126)
BUN: 17 mg/dL (ref 6–20)
CHLORIDE: 103 mmol/L (ref 101–111)
CO2: 22 mmol/L (ref 22–32)
Calcium: 9.7 mg/dL (ref 8.9–10.3)
Creatinine, Ser: 0.93 mg/dL (ref 0.44–1.00)
GFR calc non Af Amer: >60 mL/min (ref >60)
Glucose, Bld: 116 mg/dL — ABNORMAL HIGH (ref 65–99)
Potassium: 3.4 mmol/L — ABNORMAL LOW (ref 3.5–5.1)
SODIUM: 139 mmol/L (ref 135–145)
Total Bilirubin: 1.6 mg/dL — ABNORMAL HIGH (ref 0.3–1.2)
Total Protein: 8.6 g/dL — ABNORMAL HIGH (ref 6.5–8.1)

## 2017-08-31 LAB — URINALYSIS, ROUTINE W REFLEX MICROSCOPIC
BACTERIA UA: NONE SEEN
Bilirubin Urine: NEGATIVE
Glucose, UA: NEGATIVE mg/dL
KETONES UR: 5 mg/dL — AB
Nitrite: NEGATIVE
PROTEIN: 30 mg/dL — AB
Specific Gravity, Urine: 1.026 (ref 1.005–1.030)
pH: 5 (ref 5.0–8.0)

## 2017-08-31 LAB — RAPID URINE DRUG SCREEN, HOSP PERFORMED
AMPHETAMINES: NOT DETECTED
BENZODIAZEPINES: NOT DETECTED
Barbiturates: NOT DETECTED
Cocaine: POSITIVE — AB
OPIATES: NOT DETECTED
Tetrahydrocannabinol: POSITIVE — AB

## 2017-08-31 LAB — I-STAT BETA HCG BLOOD, ED (MC, WL, AP ONLY): I-stat hCG, quantitative: <5 m[IU]/mL (ref <5)

## 2017-08-31 MED ORDER — METOCLOPRAMIDE HCL 10 MG PO TABS: 10.0000 mg | ORAL_TABLET | Freq: Four times a day (QID) | ORAL | 0 refills | Status: DC | PRN

## 2017-08-31 MED ORDER — SODIUM CHLORIDE 0.9 % IV BOLUS (SEPSIS)
1000.0000 mL | Freq: Once | INTRAVENOUS | Status: AC
Start: 2017-08-31 — End: 2017-08-31
  Administered 2017-08-31: 1000 mL via INTRAVENOUS

## 2017-08-31 MED ORDER — HALOPERIDOL LACTATE 5 MG/ML IJ SOLN
2.0000 mg | Freq: Once | INTRAMUSCULAR | Status: AC
  Administered 2017-08-31: 2 mg via INTRAVENOUS
  Filled 2017-08-31: qty 1

## 2017-08-31 MED ORDER — METOCLOPRAMIDE HCL 5 MG/ML IJ SOLN
10.0000 mg | Freq: Once | INTRAMUSCULAR | Status: AC
  Administered 2017-08-31: 10 mg via INTRAVENOUS
  Filled 2017-08-31: qty 2

## 2017-08-31 MED ORDER — SODIUM CHLORIDE 0.9 % IV BOLUS (SEPSIS)
1000.0000 mL | Freq: Once | INTRAVENOUS | Status: AC
  Administered 2017-08-31: 1000 mL via INTRAVENOUS

## 2017-08-31 MED ORDER — ONDANSETRON HCL 4 MG/2ML IJ SOLN
4.0000 mg | Freq: Once | INTRAMUSCULAR | Status: AC
  Administered 2017-08-31: 4 mg via INTRAVENOUS
  Filled 2017-08-31: qty 2

## 2017-08-31 MED ORDER — ONDANSETRON HCL 4 MG PO TABS: 4.0000 mg | ORAL_TABLET | Freq: Four times a day (QID) | ORAL | 0 refills | Status: DC

## 2017-08-31 MED ORDER — LORAZEPAM 2 MG/ML IJ SOLN
0.5000 mg | Freq: Once | INTRAMUSCULAR | Status: AC
  Administered 2017-08-31: 0.5 mg via INTRAVENOUS
  Filled 2017-08-31: qty 1

## 2017-08-31 NOTE — ED Triage Notes (Signed)
Pt brought in by [facility] for c/o abdominal pain and states she has been vomiting for the last 3 days; pt given zofran 4mg  iv by [facility]

## 2017-08-31 NOTE — ED Provider Notes (Signed)
[facility] EMERGENCY DEPARTMENT Provider Note   CSN: [id number] Arrival date & time: 08/31/17  0111     History   Chief Complaint Chief Complaint  Patient presents with  . Abdominal Pain    HPI Amanda Carrillo is a 42 y.o. female.  Patient presents to the emergency department for evaluation of abdominal pain with nausea and vomiting.  Patient reports symptoms have been ongoing for 3 days.  She is currently menstruating.  She reports that she has been having very heavy menstrual cycles recently and is passing clots.  She has increased pain in the uterus when she passes clots.  Pain radiates all the way up to the upper abdomen, however.  She reports that she has had 2 similar episodes in the past.  She was told that she had gastroparesis with the first episode.  The second time a CAT scan showed colitis.  Symptoms are similar to these 2 previous episodes.      Past Medical History:  Diagnosis Date  . ADHD (attention deficit hyperactivity disorder)   . Anxiety   . Depression   . Polysubstance abuse (HCC)    herion, methadone, etoh    Patient Active Problem List   Diagnosis Date Noted  . Opioid use disorder, severe, dependence (HCC) 08/07/2017  . Opioid use disorder (HCC) 08/04/2017  . Depression     Past Surgical History:  Procedure Laterality Date  . CHOLECYSTECTOMY    . TUBAL LIGATION    . TUBAL LIGATION      OB History    Gravida Para Term Preterm AB Living   3 2 2   1      SAB TAB Ectopic Multiple Live Births     1             Home Medications    Prior to Admission medications   Medication Sig Start Date End Date Taking? Authorizing Provider  Buprenorphine HCl-Naloxone HCl (SUBOXONE) 8-2 MG FILM Place 1 Film under the tongue 2 (two) times daily. 08/14/17   Gust Rung, DO    Family History Family History  Problem Relation Age of Onset  . Pneumonia Other     Social History Social History   Tobacco Use  . Smoking status: Current Every Day  Smoker    Packs/day: 0.50    Types: Cigarettes  . Smokeless tobacco: Never Used  Substance Use Topics  . Alcohol use: No  . Drug use: No    Comment: herion (former per pt)     Allergies   Toradol [ketorolac tromethamine]   Review of Systems Review of Systems  Gastrointestinal: Positive for abdominal pain, nausea and vomiting.  Genitourinary: Positive for vaginal bleeding.  All other systems reviewed and are negative.    Physical Exam Updated Vital Signs BP 136/80 (BP Location: Right Arm)   Pulse 70   Temp 98.9 F (37.2 C) (Oral)   Resp 16   Ht 5\' 9"  (1.753 m)   Wt 92.1 kg (203 lb)   LMP 08/30/2017   SpO2 98%   BMI 29.98 kg/m   Physical Exam  Constitutional: She is oriented to person, place, and time. She appears well-developed and well-nourished. No distress.  HENT:  Head: Normocephalic and atraumatic.  Right Ear: Hearing normal.  Left Ear: Hearing normal.  Nose: Nose normal.  Mouth/Throat: Oropharynx is clear and moist and mucous membranes are normal.  Eyes: Conjunctivae and EOM are normal. Pupils are equal, round, and reactive to light.  Neck: Normal  range of motion. Neck supple.  Cardiovascular: Regular rhythm, S1 normal and S2 normal. Exam reveals no gallop and no friction rub.  No murmur heard. Pulmonary/Chest: Effort normal and breath sounds normal. No respiratory distress. She exhibits no tenderness.  Abdominal: Soft. Normal appearance and bowel sounds are normal. There is no hepatosplenomegaly. There is generalized tenderness. There is no rebound, no guarding, no tenderness at McBurney's point and negative Murphy's sign. No hernia.  Musculoskeletal: Normal range of motion.  Neurological: She is alert and oriented to person, place, and time. She has normal strength. No cranial nerve deficit or sensory deficit. Coordination normal. GCS eye subscore is 4. GCS verbal subscore is 5. GCS motor subscore is 6.  Skin: Skin is warm, dry and intact. No rash noted. No  cyanosis.  Psychiatric: She has a normal mood and affect. Her speech is normal and behavior is normal. Thought content normal.  Nursing note and vitals reviewed.    ED Treatments / Results  Labs (all labs ordered are listed, but only abnormal results are displayed) Labs Reviewed  CBC WITH DIFFERENTIAL/PLATELET - Abnormal; Notable for the following components:      Result Value   WBC 13.7 (*)    Hemoglobin 15.1 (*)    Neutro Abs 9.8 (*)    All other components within normal limits  COMPREHENSIVE METABOLIC PANEL - Abnormal; Notable for the following components:   Potassium 3.4 (*)    Glucose, Bld 116 (*)    Total Protein 8.6 (*)    Total Bilirubin 1.6 (*)    All other components within normal limits  RAPID URINE DRUG SCREEN, HOSP PERFORMED - Abnormal; Notable for the following components:   Cocaine POSITIVE (*)    Tetrahydrocannabinol POSITIVE (*)    All other components within normal limits  URINALYSIS, ROUTINE W REFLEX MICROSCOPIC - Abnormal; Notable for the following components:   Color, Urine AMBER (*)    APPearance HAZY (*)    Hgb urine dipstick LARGE (*)    Ketones, ur 5 (*)    Protein, ur 30 (*)    Leukocytes, UA TRACE (*)    Squamous Epithelial / LPF 0-5 (*)    All other components within normal limits  I-STAT BETA HCG BLOOD, ED ([facility], [facility], [facility] ONLY)    EKG  EKG Interpretation None       Radiology Dg Abd Acute W/chest  Result Date: 08/31/2017 CLINICAL DATA:  Abdominal pain and vomiting. EXAM: DG ABDOMEN ACUTE W/ 1V CHEST COMPARISON:  06/15/2017 chest radiograph. FINDINGS: Stable cardiomediastinal silhouette with normal heart size. No pneumothorax. No pleural effusion. Lungs appear clear, with no acute consolidative airspace disease and no pulmonary edema. Cholecystectomy clips are seen in the right upper quadrant of the abdomen. No disproportionately dilated small bowel loops or significant air-fluid levels. Mild colonic stool. No radiopaque nephrolithiasis.  IMPRESSION: 1. No active cardiopulmonary disease. 2. Nonobstructive bowel gas pattern. Electronically Signed   By: Delbert PhenixJason A Poff M.D.   On: 08/31/2017 02:36    Procedures Procedures (including critical care time)  Medications Ordered in ED Medications  haloperidol lactate (HALDOL) injection 2 mg (not administered)  LORazepam (ATIVAN) injection 0.5 mg (not administered)  sodium chloride 0.9 % bolus 1,000 mL (1,000 mLs Intravenous New Bag/Given 08/31/17 0140)    Followed by  sodium chloride 0.9 % bolus 1,000 mL (1,000 mLs Intravenous New Bag/Given 08/31/17 0140)  ondansetron (ZOFRAN) injection 4 mg (4 mg Intravenous Given 08/31/17 0133)  metoCLOPramide (REGLAN) injection 10 mg (10 mg Intravenous  Given 08/31/17 0133)     Initial Impression / Assessment and Plan / ED Course  I have reviewed the triage vital signs and the nursing notes.  Pertinent labs & imaging results that were available during my care of the patient were reviewed by me and considered in my medical decision making (see chart for details).     Patient presents to the emergency department for evaluation of abdominal pain.  Patient has had nausea and vomiting associated with the pain.  She has not had fever or diarrhea.  She reports that she has had similar symptoms in the past.  Patient has previously been diagnosed with cyclic vomiting syndrome secondary to [facility] use as well as gastroparesis from opioid use.  She has a history of polysubstance drug abuse.  Drug screen today is positive for THC and cocaine.  Patient has diffuse abdominal tenderness without signs of peritonitis.  No concern for acute surgical process.  In the past she has been diagnosed with colitis but this does not fit current presentation.  Patient was informed that both cyclic vomiting syndrome and gastroparesis would worsen with opioid analgesia.  She was informed that she would not be getting pain medicine, was given IV hydration and antiemetics.  She did have  some improvement and will not require hospitalization at this time.  Final Clinical Impressions(s) / ED Diagnoses   Final diagnoses:  Generalized abdominal pain  Non-intractable vomiting with nausea, unspecified vomiting type    ED Discharge Orders    None       Gilda Crease, MD 08/31/17 [phone]

## 2017-09-01 ENCOUNTER — Ambulatory Visit: Payer: Self-pay

## 2017-09-30 ENCOUNTER — Ambulatory Visit: Payer: Self-pay | Admitting: Nurse Practitioner

## 2017-10-05 ENCOUNTER — Ambulatory Visit: Payer: Self-pay | Admitting: Nurse Practitioner

## 2017-10-07 ENCOUNTER — Ambulatory Visit: Payer: Self-pay | Admitting: Nurse Practitioner

## 2017-11-03 ENCOUNTER — Encounter (HOSPITAL_COMMUNITY): Payer: Self-pay | Admitting: Emergency Medicine

## 2017-11-03 ENCOUNTER — Emergency Department (HOSPITAL_COMMUNITY)
Admission: EM | Admit: 2017-11-03 | Discharge: 2017-11-04 | Disposition: A | Discharge status: Other Acute Inpatient Hospital | Payer: Self-pay | Attending: Emergency Medicine | Admitting: Emergency Medicine

## 2017-11-03 ENCOUNTER — Other Ambulatory Visit: Payer: Self-pay

## 2017-11-03 DIAGNOSIS — R45851 Suicidal ideations: Secondary | ICD-10-CM | POA: Insufficient documentation

## 2017-11-03 DIAGNOSIS — Z79899 Other long term (current) drug therapy: Secondary | ICD-10-CM | POA: Insufficient documentation

## 2017-11-03 DIAGNOSIS — F17210 Nicotine dependence, cigarettes, uncomplicated: Secondary | ICD-10-CM | POA: Insufficient documentation

## 2017-11-03 DIAGNOSIS — F419 Anxiety disorder, unspecified: Secondary | ICD-10-CM | POA: Insufficient documentation

## 2017-11-03 DIAGNOSIS — F332 Major depressive disorder, recurrent severe without psychotic features: Secondary | ICD-10-CM | POA: Insufficient documentation

## 2017-11-03 DIAGNOSIS — F1410 Cocaine abuse, uncomplicated: Secondary | ICD-10-CM | POA: Insufficient documentation

## 2017-11-03 DIAGNOSIS — F322 Major depressive disorder, single episode, severe without psychotic features: Secondary | ICD-10-CM

## 2017-11-03 LAB — COMPREHENSIVE METABOLIC PANEL
ALBUMIN: 4.4 g/dL (ref 3.5–5.0)
ALK PHOS: 67 U/L (ref 38–126)
ALT: 15 U/L (ref 14–54)
ANION GAP: 12 (ref 5–15)
AST: 19 U/L (ref 15–41)
BILIRUBIN TOTAL: 0.5 mg/dL (ref 0.3–1.2)
BUN: 20 mg/dL (ref 6–20)
CALCIUM: 9.5 mg/dL (ref 8.9–10.3)
CO2: 21 mmol/L — ABNORMAL LOW (ref 22–32)
CREATININE: 0.95 mg/dL (ref 0.44–1.00)
Chloride: 102 mmol/L (ref 101–111)
GFR calc Af Amer: >60 mL/min (ref >60)
GFR calc non Af Amer: >60 mL/min (ref >60)
GLUCOSE: 122 mg/dL — AB (ref 65–99)
Potassium: 3.8 mmol/L (ref 3.5–5.1)
Sodium: 135 mmol/L (ref 135–145)
Total Protein: 8.5 g/dL — ABNORMAL HIGH (ref 6.5–8.1)

## 2017-11-03 LAB — RAPID URINE DRUG SCREEN, HOSP PERFORMED
AMPHETAMINES: NOT DETECTED
Barbiturates: NOT DETECTED
Benzodiazepines: NOT DETECTED
Cocaine: POSITIVE — AB
OPIATES: NOT DETECTED
Tetrahydrocannabinol: NOT DETECTED

## 2017-11-03 LAB — CBC
HCT: 47.2 % — ABNORMAL HIGH (ref 36.0–46.0)
Hemoglobin: 15.9 g/dL — ABNORMAL HIGH (ref 12.0–15.0)
MCH: 31.8 pg (ref 26.0–34.0)
MCHC: 33.7 g/dL (ref 30.0–36.0)
MCV: 94.4 fL (ref 78.0–100.0)
PLATELETS: 237 10*3/uL (ref 150–400)
RBC: 5 MIL/uL (ref 3.87–5.11)
RDW: 13.4 % (ref 11.5–15.5)
WBC: 13.6 10*3/uL — ABNORMAL HIGH (ref 4.0–10.5)

## 2017-11-03 LAB — ETHANOL: Alcohol, Ethyl (B): <10 mg/dL (ref <10)

## 2017-11-03 LAB — HCG, QUANTITATIVE, PREGNANCY

## 2017-11-03 NOTE — ED Notes (Signed)
Gave patient a drink and crackers ok per Nurse Herbert SetaHeather

## 2017-11-03 NOTE — ED Triage Notes (Signed)
Pt states having an addiction problem with cocaine.  Last use was today.  Pt denies SI/HI today but states she has SI ideation at times.  Pt states she was "I was wishing I would die in my sleep last night"
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
## 2017-11-03 NOTE — ED Provider Notes (Signed)
[facility] EMERGENCY DEPARTMENT Provider Note   CSN: [id number] Arrival date & time: 11/03/17  1614     History   Chief Complaint Chief Complaint  Patient presents with  . Addiction Problem    HPI Amanda Carrillo is a 42 y.o. female.  HPI  The patient is a 42 year old female, she presents to the hospital today with multiple complaints but primarily the patient states "I am tired of living this way and I do not know if I can keep going on".  She reports that she has a significant history of substance abuse dating back to her teenage years using multiple different substances including heroin marijuana cocaine and other drugs as well.  Recently over the last 2 months she has been using heavy amounts of cocaine up to $100 per day, last use last night.  She does not live with her children both 51 and 41 years old who live with their fathers parents.  She currently lives with her fianc and his parents, she reports increasing amounts of depression, she is sleeping all the time and has a poor appetite and has had some recent thoughts of suicide though she does not have a specific plan.  She reports that she has thought occasionally about taking overdose of pills but does not have an active plan to do that at this time.  Her symptoms are gradually worsening and have now become severe.  She is extremely tearful throughout the interview.  Past Medical History:  Diagnosis Date  . ADHD (attention deficit hyperactivity disorder)   . Anxiety   . Depression   . Polysubstance abuse (HCC)    herion, methadone, etoh    Patient Active Problem List   Diagnosis Date Noted  . Opioid use disorder, severe, dependence (HCC) 08/07/2017  . Opioid use disorder (HCC) 08/04/2017  . Depression     Past Surgical History:  Procedure Laterality Date  . CHOLECYSTECTOMY    . TUBAL LIGATION    . TUBAL LIGATION      OB History    Gravida Para Term Preterm AB Living   3 2 2   1      SAB TAB Ectopic Multiple  Live Births     1             Home Medications    Prior to Admission medications   Medication Sig Start Date End Date Taking? Authorizing Provider  Buprenorphine HCl-Naloxone HCl (SUBOXONE) 8-2 MG FILM Place 1 Film under the tongue 2 (two) times daily. 08/14/17   Gust Rung, DO  metoCLOPramide (REGLAN) 10 MG tablet Take 1 tablet (10 mg total) by mouth every 6 (six) hours as needed for nausea or vomiting. 08/31/17   Gilda Crease, MD  ondansetron (ZOFRAN) 4 MG tablet Take 1 tablet (4 mg total) by mouth every 6 (six) hours. 08/31/17   Gilda Crease, MD    Family History Family History  Problem Relation Age of Onset  . Pneumonia Other     Social History Social History   Tobacco Use  . Smoking status: Current Every Day Smoker    Packs/day: 0.50    Types: Cigarettes  . Smokeless tobacco: Never Used  Substance Use Topics  . Alcohol use: No  . Drug use: No    Comment: herion (former per pt)     Allergies   Toradol [ketorolac tromethamine]   Review of Systems Review of Systems  All other systems reviewed and are negative.  Physical Exam Updated Vital Signs BP 121/72 (BP Location: Right Arm)   Pulse 91   Temp 98.3 F (36.8 C) (Oral)   Resp 19   Ht 5\' 9"  (1.753 m)   Wt 93 kg (205 lb)   SpO2 95%   BMI 30.27 kg/m   Physical Exam  Constitutional: She appears well-developed and well-nourished. No distress.  HENT:  Head: Normocephalic and atraumatic.  Mouth/Throat: Oropharynx is clear and moist. No oropharyngeal exudate.  Eyes: Conjunctivae and EOM are normal. Pupils are equal, round, and reactive to light. Right eye exhibits no discharge. Left eye exhibits no discharge. No scleral icterus.  Neck: Normal range of motion. Neck supple. No JVD present. No thyromegaly present.  Cardiovascular: Normal rate, regular rhythm, normal heart sounds and intact distal pulses. Exam reveals no gallop and no friction rub.  No murmur heard. Pulmonary/Chest:  Effort normal and breath sounds normal. No respiratory distress. She has no wheezes. She has no rales.  Abdominal: Soft. Bowel sounds are normal. She exhibits no distension and no mass. There is no tenderness.  Musculoskeletal: Normal range of motion. She exhibits no edema or tenderness.  Lymphadenopathy:    She has no cervical adenopathy.  Neurological: She is alert. Coordination normal.  Skin: Skin is warm and dry. No rash noted. No erythema.  Psychiatric:  The patient has a depressed affect, she is tearful, she does not appear to be responding to internal stimuli, she does endorse passive suicidal ideation  Nursing note and vitals reviewed.    ED Treatments / Results  Labs (all labs ordered are listed, but only abnormal results are displayed) Labs Reviewed  COMPREHENSIVE METABOLIC PANEL - Abnormal; Notable for the following components:      Result Value   CO2 21 (*)    Glucose, Bld 122 (*)    Total Protein 8.5 (*)    All other components within normal limits  CBC - Abnormal; Notable for the following components:   WBC 13.6 (*)    Hemoglobin 15.9 (*)    HCT 47.2 (*)    All other components within normal limits  RAPID URINE DRUG SCREEN, HOSP PERFORMED - Abnormal; Notable for the following components:   Cocaine POSITIVE (*)    All other components within normal limits  ETHANOL  HCG, QUANTITATIVE, PREGNANCY    Radiology No results found.  Procedures Procedures (including critical care time)  Medications Ordered in ED Medications - No data to display   Initial Impression / Assessment and Plan / ED Course  I have reviewed the triage vital signs and the nursing notes.  Pertinent labs & imaging results that were available during my care of the patient were reviewed by me and considered in my medical decision making (see chart for details).     The patient will need to be seen by psychiatry, labs are pending, she does have a admitted history of frequent cocaine use but  does not appear to be toxic on this medication or substance at this time.  The patient was recommended for inpatient admission.  I agree with this disposition.  Final Clinical Impressions(s) / ED Diagnoses   Final diagnoses:  Current severe episode of major depressive disorder without psychotic features, unspecified whether recurrent (HCC)  Cocaine abuse [facility](HCC)    ED Discharge Orders    None       Eber HongMiller, Danyela Posas, MD 11/04/17 0013

## 2017-11-03 NOTE — Progress Notes (Signed)
Contacted [facility] Charge Nurse Angelica ChessmanMandy and informed her that after reviewing pt's chart and information with Dellia Beckwithina Okonkow, NP, it was determined that pt meets criteria for inpatient hospitalization. Mandy expressed an understanding.

## 2017-11-04 ENCOUNTER — Inpatient Hospital Stay (HOSPITAL_COMMUNITY)
Admission: AD | Admit: 2017-11-04 | Discharge: 2017-11-09 | DRG: 885 | Disposition: A | Discharge status: Home / Self Care | Payer: Federal, State, Local not specified - Other | Source: Other Acute Inpatient Hospital | Attending: Psychiatry | Admitting: Psychiatry

## 2017-11-04 ENCOUNTER — Encounter (HOSPITAL_COMMUNITY): Payer: Self-pay

## 2017-11-04 ENCOUNTER — Other Ambulatory Visit: Payer: Self-pay

## 2017-11-04 DIAGNOSIS — N390 Urinary tract infection, site not specified: Secondary | ICD-10-CM | POA: Diagnosis present

## 2017-11-04 DIAGNOSIS — R450 Nervousness: Secondary | ICD-10-CM | POA: Diagnosis not present

## 2017-11-04 DIAGNOSIS — G4700 Insomnia, unspecified: Secondary | ICD-10-CM | POA: Diagnosis present

## 2017-11-04 DIAGNOSIS — F17210 Nicotine dependence, cigarettes, uncomplicated: Secondary | ICD-10-CM | POA: Diagnosis present

## 2017-11-04 DIAGNOSIS — Z79899 Other long term (current) drug therapy: Secondary | ICD-10-CM

## 2017-11-04 DIAGNOSIS — Z9049 Acquired absence of other specified parts of digestive tract: Secondary | ICD-10-CM | POA: Diagnosis not present

## 2017-11-04 DIAGNOSIS — Z9851 Tubal ligation status: Secondary | ICD-10-CM

## 2017-11-04 DIAGNOSIS — T43215A Adverse effect of selective serotonin and norepinephrine reuptake inhibitors, initial encounter: Secondary | ICD-10-CM | POA: Diagnosis not present

## 2017-11-04 DIAGNOSIS — G4710 Hypersomnia, unspecified: Secondary | ICD-10-CM

## 2017-11-04 DIAGNOSIS — Z885 Allergy status to narcotic agent status: Secondary | ICD-10-CM | POA: Diagnosis not present

## 2017-11-04 DIAGNOSIS — F3162 Bipolar disorder, current episode mixed, moderate: Principal | ICD-10-CM | POA: Diagnosis present

## 2017-11-04 DIAGNOSIS — F419 Anxiety disorder, unspecified: Secondary | ICD-10-CM | POA: Diagnosis not present

## 2017-11-04 DIAGNOSIS — Z23 Encounter for immunization: Secondary | ICD-10-CM

## 2017-11-04 DIAGNOSIS — F322 Major depressive disorder, single episode, severe without psychotic features: Secondary | ICD-10-CM | POA: Insufficient documentation

## 2017-11-04 DIAGNOSIS — F909 Attention-deficit hyperactivity disorder, unspecified type: Secondary | ICD-10-CM | POA: Diagnosis present

## 2017-11-04 DIAGNOSIS — F1120 Opioid dependence, uncomplicated: Secondary | ICD-10-CM | POA: Diagnosis present

## 2017-11-04 DIAGNOSIS — Y92230 Patient room in hospital as the place of occurrence of the external cause: Secondary | ICD-10-CM | POA: Diagnosis not present

## 2017-11-04 DIAGNOSIS — F1490 Cocaine use, unspecified, uncomplicated: Secondary | ICD-10-CM | POA: Diagnosis present

## 2017-11-04 DIAGNOSIS — Z818 Family history of other mental and behavioral disorders: Secondary | ICD-10-CM | POA: Diagnosis not present

## 2017-11-04 DIAGNOSIS — Z560 Unemployment, unspecified: Secondary | ICD-10-CM

## 2017-11-04 DIAGNOSIS — R45851 Suicidal ideations: Secondary | ICD-10-CM | POA: Diagnosis present

## 2017-11-04 HISTORY — DX: Gastroparesis: K31.84

## 2017-11-04 LAB — URINALYSIS, ROUTINE W REFLEX MICROSCOPIC
BILIRUBIN URINE: NEGATIVE
Glucose, UA: NEGATIVE mg/dL
KETONES UR: NEGATIVE mg/dL
NITRITE: POSITIVE — AB
PROTEIN: NEGATIVE mg/dL
Specific Gravity, Urine: 1.018 (ref 1.005–1.030)
pH: 5 (ref 5.0–8.0)

## 2017-11-04 MED ORDER — ACETAMINOPHEN 325 MG PO TABS
650.0000 mg | ORAL_TABLET | Freq: Four times a day (QID) | ORAL | Status: DC | PRN
  Administered 2017-11-05 – 2017-11-08 (×6): 650 mg via ORAL
  Filled 2017-11-04 (×6): qty 2

## 2017-11-04 MED ORDER — CEPHALEXIN 500 MG PO CAPS
500.0000 mg | ORAL_CAPSULE | Freq: Three times a day (TID) | ORAL | Status: DC
  Administered 2017-11-05 – 2017-11-09 (×14): 500 mg via ORAL
  Filled 2017-11-04 (×3): qty 1
  Filled 2017-11-04: qty 9
  Filled 2017-11-04 (×3): qty 1
  Filled 2017-11-04: qty 9
  Filled 2017-11-04: qty 2
  Filled 2017-11-04 (×8): qty 1
  Filled 2017-11-04: qty 9
  Filled 2017-11-04 (×2): qty 1

## 2017-11-04 MED ORDER — CEPHALEXIN 500 MG PO CAPS
500.0000 mg | ORAL_CAPSULE | Freq: Three times a day (TID) | ORAL | Status: DC
  Administered 2017-11-04: 500 mg via ORAL
  Filled 2017-11-04: qty 1

## 2017-11-04 MED ORDER — INFLUENZA VAC SPLIT HIGH-DOSE 0.5 ML IM SUSY
0.5000 mL | PREFILLED_SYRINGE | INTRAMUSCULAR | Status: AC
  Administered 2017-11-05: 0.5 mL via INTRAMUSCULAR
  Filled 2017-11-04: qty 0.5

## 2017-11-04 MED ORDER — PNEUMOCOCCAL VAC POLYVALENT 25 MCG/0.5ML IJ INJ
0.5000 mL | INJECTION | INTRAMUSCULAR | Status: AC
  Administered 2017-11-05: 0.5 mL via INTRAMUSCULAR

## 2017-11-04 MED ORDER — TRAZODONE HCL 50 MG PO TABS
50.0000 mg | ORAL_TABLET | Freq: Every evening | ORAL | Status: DC | PRN
  Administered 2017-11-04 (×2): 50 mg via ORAL
  Filled 2017-11-04 (×7): qty 1

## 2017-11-04 MED ORDER — NICOTINE 21 MG/24HR TD PT24
21.0000 mg | MEDICATED_PATCH | Freq: Every day | TRANSDERMAL | Status: DC
  Administered 2017-11-05 – 2017-11-09 (×5): 21 mg via TRANSDERMAL
  Filled 2017-11-04 (×7): qty 1

## 2017-11-04 MED ORDER — MAGNESIUM HYDROXIDE 400 MG/5ML PO SUSP: 30.0000 mL | Freq: Every day | ORAL | Status: DC | PRN

## 2017-11-04 MED ORDER — NICOTINE 21 MG/24HR TD PT24
MEDICATED_PATCH | TRANSDERMAL | Status: AC
  Administered 2017-11-04: 21 mg
  Filled 2017-11-04: qty 1

## 2017-11-04 MED ORDER — ALUM & MAG HYDROXIDE-SIMETH 200-200-20 MG/5ML PO SUSP: 30.0000 mL | ORAL | Status: DC | PRN

## 2017-11-04 NOTE — Progress Notes (Signed)
Patient ID: Amanda Carrillo, female   DOB: 1976-05-10, 42 y.o.   MRN: [id number]  42 [facility] with reports of ongoing depressive and anxiety symptoms. Reports she currently lives with her boyfriends family. The matriarch suffered a stroke not too long ago and she currently serves as her caretaker. Reports ongoing tobacco and cocaine use to deal with anxiety and ongoing angry outbursts. [facility]ed [facility]. Reports being off of her medications for 6 months. Since then she has been hospitalized for gastroparesis twice. Reports she got "fluids and pain meds for my stomach pain" both times. Endorses weight loss due to diagnosis and diet restrictions, as much as 50 lbs within the past 6 months.  Requests to get back on her medications and find ways to "deal with feeling overwhelmed and my anger."  Pt currently denies SI/HI and A/V hallucinations. Pt verbally agrees to seek staff if SI/HI or A/VH occurs and to consult with staff before acting on any harmful thoughts.Consents signed, skin/belongings search completed and pt oriented to unit. Pt stable at this time. Pt given the opportunity to express concerns and ask questions. Pt given toiletries. Will continue to monitor.

## 2017-11-04 NOTE — ED Notes (Signed)
[facility].

## 2017-11-04 NOTE — Tx Team (Signed)
Initial Treatment Plan 11/04/2017 10:39 PM Amanda Matermie L Thaw ZOX:[id number]    PATIENT STRESSORS: Financial difficulties Health problems Medication change or noncompliance Substance abuse   PATIENT STRENGTHS: Ability for insight Capable of independent living General fund of knowledge Supportive family/friends   PATIENT IDENTIFIED PROBLEMS: Depression  "I don't know how to control my angry outbursts"  "I need to talk to someone, get back on my meds"  "I don't want to feel overwhelmed anymore. I want to take care of me not anyone else."  Suicidal ideation  Anxiety           DISCHARGE CRITERIA:  Ability to meet basic life and health needs Improved stabilization in mood, thinking, and/or behavior Verbal commitment to aftercare and medication compliance  PRELIMINARY DISCHARGE PLAN: Attend aftercare/continuing care group Outpatient therapy Return to previous living arrangement  PATIENT/FAMILY INVOLVEMENT: This treatment plan has been presented to and reviewed with the patient, Amanda Carrillo. The patient and family have been given the opportunity to ask questions and make suggestions.  Aurora Maskwyman, Chloee Tena E, RN 11/04/2017, 10:39 PM

## 2017-11-04 NOTE — ED Notes (Signed)
Patient left unit with [facility] transportation.  All belongings with patient. Attempt report, no answer at this time.

## 2017-11-04 NOTE — ED Notes (Signed)
Pt given breakfast tray

## 2017-11-04 NOTE — ED Notes (Signed)
[facility]

## 2017-11-04 NOTE — Progress Notes (Signed)
[facility] # [facility]2 once a UA noting pts WBC has been collected.  Leighton Ruffina Okonkwo, NP is the accepting provider.   Dr. Adela Glimpseabos is the attending provider.  Call report to [phone]. [facility] ED RN notified by [facility], RN.  Pt is Voluntary and will be transported by [facility].  [facility].   Wells GuilesSarah Gisell Buehrle, LCSW, LCAS Disposition CSW [facility] [phone]

## 2017-11-04 NOTE — ED Provider Notes (Signed)
UA noted. Has UTI. Fine for dispo. Keflex.    Raeford RazorKohut, Iyonnah Ferrante, MD 11/04/17 1750

## 2017-11-05 DIAGNOSIS — G4700 Insomnia, unspecified: Secondary | ICD-10-CM

## 2017-11-05 DIAGNOSIS — F419 Anxiety disorder, unspecified: Secondary | ICD-10-CM

## 2017-11-05 DIAGNOSIS — R450 Nervousness: Secondary | ICD-10-CM

## 2017-11-05 DIAGNOSIS — F3162 Bipolar disorder, current episode mixed, moderate: Secondary | ICD-10-CM | POA: Diagnosis present

## 2017-11-05 DIAGNOSIS — F17210 Nicotine dependence, cigarettes, uncomplicated: Secondary | ICD-10-CM

## 2017-11-05 DIAGNOSIS — Z653 Problems related to other legal circumstances: Secondary | ICD-10-CM

## 2017-11-05 DIAGNOSIS — Z818 Family history of other mental and behavioral disorders: Secondary | ICD-10-CM

## 2017-11-05 DIAGNOSIS — Z560 Unemployment, unspecified: Secondary | ICD-10-CM

## 2017-11-05 MED ORDER — HYDROXYZINE HCL 50 MG PO TABS
50.0000 mg | ORAL_TABLET | Freq: Four times a day (QID) | ORAL | Status: DC | PRN
  Administered 2017-11-05 – 2017-11-07 (×3): 50 mg via ORAL
  Filled 2017-11-05 (×3): qty 1

## 2017-11-05 MED ORDER — ADULT MULTIVITAMIN W/MINERALS CH
1.0000 | ORAL_TABLET | Freq: Every day | ORAL | Status: DC
  Administered 2017-11-05 – 2017-11-09 (×5): 1 via ORAL
  Filled 2017-11-05 (×7): qty 1

## 2017-11-05 MED ORDER — OLANZAPINE 10 MG PO TABS
10.0000 mg | ORAL_TABLET | Freq: Every day | ORAL | Status: DC
  Administered 2017-11-05 – 2017-11-08 (×4): 10 mg via ORAL
  Filled 2017-11-05 (×3): qty 1
  Filled 2017-11-05: qty 7
  Filled 2017-11-05 (×3): qty 1

## 2017-11-05 MED ORDER — FLUOXETINE HCL 20 MG PO CAPS
20.0000 mg | ORAL_CAPSULE | Freq: Every day | ORAL | Status: DC
  Administered 2017-11-05 – 2017-11-09 (×5): 20 mg via ORAL
  Filled 2017-11-05: qty 1
  Filled 2017-11-05: qty 7
  Filled 2017-11-05 (×6): qty 1

## 2017-11-05 MED ORDER — ENSURE ENLIVE PO LIQD: 237.0000 mL | Freq: Every day | ORAL | Status: DC | PRN

## 2017-11-05 MED ORDER — TRAZODONE HCL 150 MG PO TABS
150.0000 mg | ORAL_TABLET | Freq: Every evening | ORAL | Status: DC | PRN
  Administered 2017-11-05 (×2): 150 mg via ORAL
  Filled 2017-11-05 (×6): qty 1

## 2017-11-05 NOTE — Progress Notes (Signed)
Patient ID: Amanda Carrillo, female   DOB: February 03, 1976, 42 y.o.   MRN: [id number]  Pt currently presents with a flat affect and agitated behavior. Pt reports ongoing anxiety. Focused on medications, ambivalent about psycho- educational learning. Reports 9/10 pain in her R arm which she attributes to punching a wall PTA. Pt reports poor sleep with medication regimen last night, requests to take extra medications ordered tonight.   Pt provided with medications per providers orders. Pt's labs and vitals were monitored throughout the night. Pt given a 1:1 about emotional and mental status. Pt supported and encouraged to express concerns and questions. Pt educated on medications. Assessment of L arm reveals complete ROM and no swelling.   Pt's safety ensured with 15 minute and environmental checks. Pt currently denies SI/HI and A/V hallucinations. Pt verbally agrees to seek staff if SI/HI or A/VH occurs and to consult with staff before acting on any harmful thoughts. Will continue POC.

## 2017-11-05 NOTE — Progress Notes (Signed)
Adult Psychoeducational Group Note  Date:  11/05/2017 Time:  9:33 PM  Group Topic/Focus:  Wrap-Up Group:   The focus of this group is to help patients review their daily goal of treatment and discuss progress on daily workbooks.  Participation Level:  Active  Participation Quality:  Appropriate  Affect:  Appropriate  Cognitive:  Appropriate  Insight: Appropriate  Engagement in Group:  Engaged  Modes of Intervention:  Discussion  Additional Comments:  Patient attended group and participated.  Broghan Pannone W Shontia Gillooly 11/05/2017, 9:33 PM

## 2017-11-05 NOTE — Progress Notes (Signed)
NUTRITION ASSESSMENT  Pt identified as at risk on the Malnutrition Screen Tool  INTERVENTION: 1. Educated patient on the importance of nutrition and encouraged intake of food and beverages. 2. Discussed weight goals. 3. Supplements:  - will order Ensure Enlive once/day PRN, each supplement provides 350 kcal and 20 grams of protein - will order daily multivitamin with minerals.    NUTRITION DIAGNOSIS: Unintentional weight loss related to sub-optimal intake as evidenced by pt report.   Goal: Pt to meet >/= 90% of their estimated nutrition needs.  Monitor:  PO intake  Assessment:  Pt admitted for ongoing depression and anxiety. She has been taking care of her boyfriend's mother after she had a stroke. She smokes and uses cocaine to deal with anxiety.   She reports hx of gastroparesis and needed to be hospitalized for this x2 recently and reports that gastroparesis dx and changes in diet led to 50 lb weight loss in the past 6 months. This reported weight loss is not confirmed by weights in the chart. Based on weight hx in the chart, it appears that pt has lost 22 lbs (11% body weight) in the past 2 months. This is significant for time frame.   Will order supplement as outlined above. Continue to encourage PO intakes of meals, supplement, and snacks.     42 y.o. female  Height: Ht Readings from Last 1 Encounters:  11/04/17 5\' 7"  (1.702 m)    Weight: Wt Readings from Last 1 Encounters:  11/04/17 181 lb (82.1 kg)    Weight Hx: Wt Readings from Last 10 Encounters:  11/04/17 181 lb (82.1 kg)  11/03/17 205 lb (93 kg)  08/31/17 203 lb (92.1 kg)  08/04/17 204 lb 6.4 oz (92.7 kg)  06/30/17 220 lb (99.8 kg)  06/15/17 210 lb (95.3 kg)  06/07/17 210 lb (95.3 kg)  01/06/17 219 lb 9.6 oz (99.6 kg)  03/06/16 230 lb (104.3 kg)  08/24/15 205 lb (93 kg)    BMI:  Body mass index is 28.35 kg/m. Pt meets criteria for overweight based on current BMI.  Estimated Nutritional  Needs: Kcal: 25-30 kcal/kg Protein: > 1 gram protein/kg Fluid: 1 ml/kcal  Diet Order: Diet regular Room service appropriate? Yes; Fluid consistency: Thin Pt is also offered choice of unit snacks mid-morning and mid-afternoon.  Pt is eating as desired.   Lab results and medications reviewed.      Trenton GammonJessica Travion Ke, MS, RD, LDN, [facility] Inpatient Clinical Dietitian Pager # [phone] After hours/weekend pager # [phone]
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
## 2017-11-05 NOTE — B[facility] Suicide Risk Assessment (Signed)
[facility] INPATIENT:  Family/Significant Other Suicide Prevention Education  Suicide Prevention Education:  Contact Attempts: Amanda Carrillo (pt's fiance) [phone] has been identified by the patient as the family member/significant other with whom the patient will be residing, and identified as the person(s) who will aid the patient in the event of a mental health crisis.  With written consent from the patient, two attempts were made to provide suicide prevention education, prior to and/or following the patient's discharge.  We were unsuccessful in providing suicide prevention education.  A suicide education pamphlet was given to the patient to share with family/significant other.  Date and time of first attempt: 11/05/17 at 10:22AM (voicemail left requesting call back at his earliest convenience).   Amanda Carrillo Smart LCSW 11/05/2017, 10:23 AM   SPE completed with pt's fiance. Aftercare reviewed. Pt's fiance has no safety concerns regarding pt returning home. Pt does not have access to weapons/firearms.   Trula SladeHeather Smart, MSW, LCSW Clinical Social Worker 11/06/2017 2:41 PM

## 2017-11-05 NOTE — [facility] Suicide Risk Assessment (Signed)
[facility] Admission Suicide Risk Assessment   Nursing information obtained from:    Demographic factors:    Current Mental Status:    Loss Factors:    Historical Factors:    Risk Reduction Factors:     Total Time spent with patient: 1 hour Principal Problem: Bipolar 1 disorder, mixed, moderate (HCC) Diagnosis:   Patient Active Problem List   Diagnosis Date Noted  . Bipolar 1 disorder, mixed, moderate (HCC) [F31.62] 11/05/2017  . MDD (major depressive disorder), severe (HCC) [F32.2] 11/04/2017  . Opioid use disorder, severe, dependence (HCC) [F11.20] 08/07/2017  . Opioid use disorder (HCC) [F11.99] 08/04/2017  . Depression [F32.9]    Subjective Data:   Amanda Carrillo is a 42 y/o F with history of treatment for depression and bipolar disorder and cocaine use who was admitted to [facility] from [facility] ED after she presented with symptoms of worsening depression, SI with plan to overdose, and worsening use of cocaine. Pt had reported that she had been off of her previous psychotropic medications for the past 6 months and had been experiencing gradually worsening symptoms of depression, anxiety, and mood swings during that time.  Upon initial presentation, pt shares, "I take care of boyfriend's mother who had a major stroke and as a result I have not been taking care of myself mentally and physically. I quit taking my medications about 6 months ago because I couldn't afford them, but then I started using cocaine to suppress my emotions." Pt reports using about $100 per day of cocaine in the recent weeks. She describes depressed mood with fluctuations and irritability. She endorses fluctuant sleep pattern (but in the past she has stayed up for more than 4 days consecutively without sleep outside of the context of substance use), guilty feelings, low energy, poor concentration, and fluctuant appetite. She denies SI, but states that she often thinks "I just wish I would not wake up." She denies any  active intention of harming herself or attempting suicide. She denies HI/AH/VH. She endorses distractibility and mood swings, but denies other symptoms of mania. She denies symptoms of OCD and PTSD. She denies illicit substance use aside from cocaine.  Discussed with patient about treatment options. She reports previously she had done well on Symbyax but she was unable to afford this medication. Discussed with patient that symbyax is combination of prozac and olanzapine, both of which have generic versions, and pt was in agreement to begin trial of both of those medications.  Continued Clinical Symptoms:    The "Alcohol Use Disorders Identification Test", Guidelines for Use in Primary Care, Second Edition.  World Science writer [facility]). Score between 0-7:  no or low risk or alcohol related problems. Score between 8-15:  moderate risk of alcohol related problems. Score between 16-19:  high risk of alcohol related problems. Score 20 or above:  warrants further diagnostic evaluation for alcohol dependence and treatment.   CLINICAL FACTORS:   Severe Anxiety and/or Agitation Bipolar Disorder:   Mixed State Alcohol/Substance Abuse/Dependencies More than one psychiatric diagnosis Previous Psychiatric Diagnoses and Treatments   Musculoskeletal: Strength & Muscle Tone: within normal limits Gait & Station: normal Patient leans: N/A  Psychiatric Specialty Exam: Physical Exam  Nursing note and vitals reviewed.   Review of Systems  Constitutional: Negative for chills and fever.  Respiratory: Negative for cough and shortness of breath.   Cardiovascular: Negative for chest pain.  Psychiatric/Behavioral: Positive for depression and substance abuse. Negative for hallucinations and suicidal ideas. The patient is  nervous/anxious and has insomnia.     Blood pressure 115/90, pulse (!) 107, temperature 97.7 F (36.5 C), temperature source Oral, resp. rate 16, height 5\' 7"  (1.702 m), weight 82.1 kg  (181 lb).Body mass index is 28.35 kg/m.  General Appearance: Casual and Fairly Groomed  Eye Contact:  Good  Speech:  Clear and Coherent and Normal Rate  Volume:  Normal  Mood:  Anxious and Depressed  Affect:  Appropriate, Congruent and Labile  Thought Process:  Coherent and Goal Directed  Orientation:  Full (Time, Place, and Person)  Thought Content:  Logical  Suicidal Thoughts:  Yes.  without intent/plan  Homicidal Thoughts:  No  Memory:  Immediate;   Fair Recent;   Fair Remote;   Fair  Judgement:  Poor  Insight:  Lacking  Psychomotor Activity:  Normal  Concentration:  Concentration: Fair  Recall:  Fis[employer]Fair  Fund of Knowledge:  Fair  Language:  Fair  Akathisia:  No  Handed:    AIMS (if indicated):     Assets:  Resilience  ADL's:  Intact  Cognition:  WNL  Sleep:  Number of Hours: 6.25    COGNITIVE FEATURES THAT CONTRIBUTE TO RISK:  None    SUICIDE RISK:   Minimal: No identifiable suicidal ideation.  Patients presenting with no risk factors but with morbid ruminations; may be classified as minimal risk based on the severity of the depressive symptoms  PLAN OF CARE:   -admit to inpatient level of care  - Bipolar I, current episode mixed   - Start olanzapine 10mg  qhs   - Start prozac 20mg  po qDay  - Anxiety    -Start atarax 50mg  po q6h prn anxietuy  -Insomnia   - Start trazodone 150mg  po qhs prn insomnia (may repeat x1)  - Encourage participation in groups and therapeutic milieu  -Disposition planning will be ongoing  I certify that inpatient services furnished can reasonably be expected to improve the patient's condition.   Micheal Likenshristopher T Maryanne Huneycutt, MD 11/05/2017, 4:57 PM

## 2017-11-05 NOTE — H&P (Addendum)
Psychiatric Admission Assessment Adult  Patient Identification: TYKESHA KONICKI  MRN:  [id number]  Date of Evaluation:  11/05/2017  Chief Complaint: Worsening symptoms of depression & anxiety triggering suicidal ideations.  Principal Diagnosis: Bipolar 1 disorder, mixed, moderate ([place])  Diagnosis:   Patient Active Problem List   Diagnosis Date Noted  . Bipolar 1 disorder, mixed, moderate ([place]) [F31.62] 11/05/2017  . MDD (major depressive disorder), severe ([place]) [F32.2] 11/04/2017  . Opioid use disorder, severe, dependence ([place]) [F11.20] 08/07/2017  . Opioid use disorder ([place]) [F11.99] 08/04/2017  . Depression [F32.9]    History of Present Illness: This is an admission assessment for this 42 year old Caucasian female with hx of drug use & mental illness. Admitted to the [facility] adult unit from the [facility]  with complaints of worsening depression/suicidal thoughts, hypersomnia & heavy cocaine use. She is admitted for mood stabilization treatments.  During this assessment, Jaleigh reports, "My boyfriend's father took me to the hospital 2 days ago. I have been feeling depressed, manic & anxious for 2 months. My boy friend's father & I has not been getting along. I live with them, helping care for his disabled wife. My problem is, I have been caring for others & neglecting myself. I have not been on my mental health medications for depression & anxiety for over 6 months due to lack of funds. I turned to street drugs (Cocaine), been using x 1 month. When I'm on my medicines, I can concentrate & maintain a stable life. I have been self-medicating with drugs. I'm also on probation for identity theft. I need this time I'm here to re-focus on myself, get on the medications that I need. I was getting my medications from the [facility] in [place], [place]. I was on Symbyax & Depakote, too expensive for me. I was also on Ativan. My mind races so bad that I feel like I need to be knocked on the head with a  baseball bat to knock me out. I do not need substance abuse treatment. I enrolled in the [facility] program where I get tested for drugs. We also have counseling services & substance abuse meetings".   Associated Signs/Symptoms:  Depression Symptoms:  depressed mood, insomnia, anxiety,  (Hypo) Manic Symptoms:  Irritable Mood, Labiality of Mood,  Anxiety Symptoms:  Excessive Worry,  Psychotic Symptoms:  Denies  PTSD Symptoms: NA  Total Time spent with patient: 15 minutes  Past Psychiatric History: Major Depression, anxiety  Is the patient at risk to self? No.  Has the patient been a risk to self in the past 6 months? Yes.    Has the patient been a risk to self within the distant past? Yes.    Is the patient a risk to others? No.  Has the patient been a risk to others in the past 6 months? No.  Has the patient been a risk to others within the distant past? No.   Prior Inpatient Therapy: [facility] a long time ago per patient's reports. Prior Outpatient Therapy: Yes, ([facility])   Alcohol Screening: Patient refused Alcohol Screening Tool: Yes Substance Abuse History in the last 12 months:  Yes.    Consequences of Substance Abuse: Medical Consequences:  Liver damage, Possible death by overdose Legal Consequences:  Arrests, jail time, Loss of driving privilege. Family Consequences:  Family discord, divorce and or separation.  Previous Psychotropic Medications: Yes, (Symbyax, Depakote)  Psychological Evaluations: Yes   Past Medical History:  Past Medical History:  Diagnosis Date  . ADHD (attention deficit hyperactivity disorder)   . Anxiety   . Depression   . Gastroparesis 01/2017  . Polysubstance abuse ([place])    herion, methadone, etoh    Past Surgical History:  Procedure Laterality Date  . CHOLECYSTECTOMY    . TUBAL LIGATION    . TUBAL LIGATION     Family History:  Family History  Problem Relation Age of Onset  . Pneumonia Other   . Depression Mother     Family Psychiatric  History: Bipolar disorder: mother.  Tobacco Screening: Have you used any form of tobacco in the last 30 days? (Cigarettes, Smokeless Tobacco, Cigars, and/or Pipes): Yes Tobacco use, Select all that apply: 5 or more cigarettes per day Are you interested in Tobacco Cessation Medications?: Yes, will notify MD for an order Counseled patient on smoking cessation including recognizing danger situations, developing coping skills and basic information about quitting provided: Refused/Declined practical counseling  Social History: Single, lives with boyfriend/boy friend's family. Unemployed, on probation for identity theft. Social History   Substance and Sexual Activity  Alcohol Use No     Social History   Substance and Sexual Activity  Drug Use Yes  . Types: IV, Cocaine   Comment: herion (former per pt)    Additional Social History: Marital status: Long term relationship Long term relationship, how long?: 3 years. 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 "It can be really draining and stressful." Additional relationship information: fiance was aware of her relapse.  Are you sexually active?: Yes What is your sexual orientation?: heterosexual Has your sexual activity been affected by drugs, alcohol, medication, or emotional stress?: n/a  Does patient have children?: Yes How many children?: 2 How is patient's relationship with their children?: 58yo daughter and 9yo daughter; they live with their paternal grandparents and have poor relationship with patient.     Pain Medications: denies currently Prescriptions: denies currently Over the Counter: denies currently History of alcohol / drug use?: Yes Longest period of sobriety (when/how long): Unknown Name of Substance 1: Cocaine 1 - Age of First Use: Early 29s 1 - Amount (size/oz): $100/day 1  - Frequency: Daily for the last two months 1 - Duration: Unknown 1 - Last Use / Amount: 11/02/17 Name of Substance 2: Marijuana 2 - Age of First Use: 17 2 - Amount (size/oz): 1-2 bowls 2 - Frequency: Every couple of months 2 - Duration: Unknown 2 - Last Use / Amount: 10/07/17  Allergies:   Allergies  Allergen Reactions  . Toradol [Ketorolac Tromethamine] Hives and Nausea And Vomiting   Lab Results:  Results for orders placed or performed during the hospital encounter of 11/03/17 (from the past 48 hour(s))  Rapid urine drug screen (hospital performed)     Status: Abnormal   Collection Time: 11/03/17  4:16 PM  Result Value Ref Range   Opiates NONE DETECTED NONE DETECTED   Cocaine POSITIVE (A) NONE DETECTED   Benzodiazepines NONE DETECTED NONE DETECTED   Amphetamines NONE DETECTED NONE DETECTED   Tetrahydrocannabinol NONE DETECTED NONE DETECTED   Barbiturates NONE DETECTED NONE DETECTED    Comment: (NOTE) DRUG SCREEN FOR MEDICAL PURPOSES ONLY.  IF CONFIRMATION IS NEEDED FOR ANY PURPOSE, NOTIFY LAB WITHIN 5 DAYS. LOWEST DETECTABLE LIMITS FOR URINE DRUG SCREEN Drug Class  Cutoff (ng/mL) Amphetamine and metabolites    1000 Barbiturate and metabolites    200 Benzodiazepine                 937 Tricyclics and metabolites     300 Opiates and metabolites        300 Cocaine and metabolites        300 THC                            50 Performed at [facility], [address], [place], [place] [address]   Comprehensive metabolic panel     Status: Abnormal   Collection Time: 11/03/17  4:28 PM  Result Value Ref Range   Sodium 135 135 - 145 mmol/L   Potassium 3.8 3.5 - 5.1 mmol/L   Chloride 102 101 - 111 mmol/L   CO2 21 (L) 22 - 32 mmol/L   Glucose, Bld 122 (H) 65 - 99 mg/dL   BUN 20 6 - 20 mg/dL   Creatinine, Ser 0.95 0.44 - 1.00 mg/dL   Calcium 9.5 8.9 - 10.3 mg/dL   Total Protein 8.5 (H) 6.5 - 8.1 g/dL   Albumin 4.4 3.5 - 5.0 g/dL   AST 19 15 - 41 U/L   ALT  15 14 - 54 U/L   Alkaline Phosphatase 67 38 - 126 U/L   Total Bilirubin 0.5 0.3 - 1.2 mg/dL   GFR calc non Af Amer >60 >60 mL/min   GFR calc Af Amer >60 >60 mL/min    Comment: (NOTE) The eGFR has been calculated using the CKD EPI equation. This calculation has not been validated in all clinical situations. eGFR's persistently <60 mL/min signify possible Chronic Kidney Disease.    Anion gap 12 5 - 15    Comment: Performed at [facility], [address], [place], [place] [address]  cbc     Status: Abnormal   Collection Time: 11/03/17  4:28 PM  Result Value Ref Range   WBC 13.6 (H) 4.0 - 10.5 K/uL   RBC 5.00 3.87 - 5.11 MIL/uL   Hemoglobin 15.9 (H) 12.0 - 15.0 g/dL   HCT 47.2 (H) 36.0 - 46.0 %   MCV 94.4 78.0 - 100.0 fL   MCH 31.8 26.0 - 34.0 pg   MCHC 33.7 30.0 - 36.0 g/dL   RDW 13.4 11.5 - 15.5 %   Platelets 237 150 - 400 K/uL    Comment: Performed at [facility], [address] [place], [place] [address]  Ethanol     Status: None   Collection Time: 11/03/17  4:31 PM  Result Value Ref Range   Alcohol, Ethyl (B) <10 <10 mg/dL    Comment:        LOWEST DETECTABLE LIMIT FOR SERUM ALCOHOL IS 10 mg/dL FOR MEDICAL PURPOSES ONLY Performed at [facility], [address], [place], [place] [address]   hCG, quantitative, pregnancy     Status: None   Collection Time: 11/03/17  4:31 PM  Result Value Ref Range   hCG, Beta Chain, Quant, S <1 <5 mIU/mL    Comment:          GEST. AGE      CONC.  (mIU/mL)   <=1 WEEK        5 - 50     2 WEEKS       50 - 500     3 WEEKS       100 -  10,000     4 WEEKS     1,000 - 30,000     5 WEEKS     3,500 - 115,000   6-8 WEEKS     12,000 - 270,000    12 WEEKS     15,000 - 220,000        FEMALE AND NON-PREGNANT FEMALE:     LESS THAN 5 mIU/mL Performed at [facility], [address] [place], [place] [address]   Urinalysis, Routine w reflex microscopic     Status: Abnormal   Collection Time: 11/04/17  4:00 PM  Result Value Ref Range   Color, Urine  YELLOW YELLOW   APPearance HAZY (A) CLEAR   Specific Gravity, Urine 1.018 1.005 - 1.030   pH 5.0 5.0 - 8.0   Glucose, UA NEGATIVE NEGATIVE mg/dL   Hgb urine dipstick SMALL (A) NEGATIVE   Bilirubin Urine NEGATIVE NEGATIVE   Ketones, ur NEGATIVE NEGATIVE mg/dL   Protein, ur NEGATIVE NEGATIVE mg/dL   Nitrite POSITIVE (A) NEGATIVE   Leukocytes, UA LARGE (A) NEGATIVE   RBC / HPF 0-5 0 - 5 RBC/hpf   WBC, UA 6-30 0 - 5 WBC/hpf   Bacteria, UA MANY (A) NONE SEEN   Squamous Epithelial / LPF 6-30 (A) NONE SEEN   Mucus PRESENT     Comment: Performed at [facility], [address] [place], [place] [address]   Blood Alcohol level:  Lab Results  Component Value Date   ETH <10 11/03/2017   ETH 40 (H) 16/38/4536   Metabolic Disorder Labs:  No results found for: HGBA1C, MPG No results found for: PROLACTIN No results found for: CHOL, TRIG, HDL, CHOLHDL, VLDL, LDLCALC  Current Medications: Current Facility-Administered Medications  Medication Dose Route Frequency Provider Last Rate Last Dose  . acetaminophen (TYLENOL) tablet 650 mg  650 mg Oral Q6H PRN Rankin, Shuvon B, NP   650 mg at 11/05/17 1423  . alum & mag hydroxide-simeth (MAALOX/MYLANTA) 200-200-20 MG/5ML suspension 30 mL  30 mL Oral Q4H PRN Rankin, Shuvon B, NP      . cephALEXin (KEFLEX) capsule 500 mg  500 mg Oral Q8H Simon, Spencer E, PA-C   500 mg at 11/05/17 1422  . feeding supplement (ENSURE ENLIVE) (ENSURE ENLIVE) liquid 237 mL  237 mL Oral Daily PRN Cobos, Fernando A, MD      . FLUoxetine (PROZAC) capsule 20 mg  20 mg Oral Daily Anthony Tamburo T, MD      . hydrOXYzine (ATARAX/VISTARIL) tablet 50 mg  50 mg Oral Q6H PRN Lindell Spar I, NP   50 mg at 11/05/17 1423  . magnesium hydroxide (MILK OF MAGNESIA) suspension 30 mL  30 mL Oral Daily PRN Rankin, Shuvon B, NP      . multivitamin with minerals tablet 1 tablet  1 tablet Oral Daily Cobos, Myer Peer, MD   1 tablet at 11/05/17 1422  . nicotine (NICODERM CQ - dosed in mg/24  hours) patch 21 mg  21 mg Transdermal Daily Patriciaann Clan E, PA-C   21 mg at 11/05/17 0813  . OLANZapine (ZYPREXA) tablet 10 mg  10 mg Oral QHS Pennelope Bracken, MD      . traZODone (DESYREL) tablet 150 mg  150 mg Oral QHS,MR X 1 Nwoko, Agnes I, NP       PTA Medications: Medications Prior to Admission  Medication Sig Dispense Refill Last Dose  . Buprenorphine HCl-Naloxone HCl (SUBOXONE) 8-2 MG FILM Place 1 Film under the tongue 2 (two)  times daily. 14 each 0   . metoCLOPramide (REGLAN) 10 MG tablet Take 1 tablet (10 mg total) by mouth every 6 (six) hours as needed for nausea or vomiting. 6 tablet 0   . ondansetron (ZOFRAN) 4 MG tablet Take 1 tablet (4 mg total) by mouth every 6 (six) hours. 12 tablet 0    Musculoskeletal: Strength & Muscle Tone: within normal limits Gait & Station: normal Patient leans: N/A  Psychiatric Specialty Exam: Physical Exam  Constitutional: She appears well-developed.  HENT:  Head: Normocephalic.  Eyes: Pupils are equal, round, and reactive to light.  Neck: Normal range of motion.  Cardiovascular: Normal rate.  Respiratory: Effort normal.  GI: Soft.  Genitourinary:  Genitourinary Comments: Deferred  Musculoskeletal: Normal range of motion.  Neurological: She is alert.  Skin: Skin is warm.    Review of Systems  Constitutional: Negative.   HENT: Negative.   Eyes: Negative.   Respiratory: Negative.   Cardiovascular: Negative for chest pain.       Elevated pulse rate  Gastrointestinal: Negative.   Genitourinary: Negative.   Musculoskeletal: Negative.   Skin: Negative.   Neurological: Negative.   Endo/Heme/Allergies: Negative.   Psychiatric/Behavioral: Positive for depression and substance abuse (UDS positive for Cocaine). Negative for hallucinations, memory loss and suicidal ideas. The patient is nervous/anxious and has insomnia.     Blood pressure 115/90, pulse (!) 107, temperature 97.7 F (36.5 C), temperature source Oral, resp. rate  16, height '5\' 7"'  (1.702 m), weight 82.1 kg (181 lb).Body mass index is 28.35 kg/m.  General Appearance: Casual and Fairly Groomed  Eye Contact:  Good  Speech:  Clear and Coherent  Volume:  Normal  Mood:  Anxious and Depressed  Affect:  Congruent  Thought Process:  Coherent, Goal Directed and Descriptions of Associations: Intact  Orientation:  Full (Time, Place, and Person)  Thought Content:  Rumination  Suicidal Thoughts:  Currently denies  Homicidal Thoughts:  Denies  Memory:  Immediate;   Good Recent;   Good Remote;   Good  Judgement:  Good  Insight:  Present  Psychomotor Activity:  Normal  Concentration:  Concentration: Good and Attention Span: Good  Recall:  Good  Fund of Knowledge:  Fair  Language:  Good  Akathisia:  No  Handed:  Right  AIMS (if indicated):     Assets:  Desire for Improvement  ADL's:  Intact  Cognition:  WNL  Sleep:  Number of Hours: 6.25   Treatment Plan Summary: Daily contact with patient to assess and evaluate symptoms and progress in treatment: See Md's SRA  Observation Level/Precautions:  15 minute checks  Laboratory:  Per ED, UDS positive for Cocaine  Psychotherapy: Group sessions   Medications: See [facility]  Consultations: As needed   Discharge Concerns: Mood stability, safety   Estimated LOS: 2-4 days  Other: Admit to the [facility].    Physician Treatment Plan for Primary Diagnosis: Bipolar 1 disorder, mixed, moderate ([place])  Long Term Goal(s): Improvement in symptoms so as ready for discharge  Short Term Goals: Ability to identify changes in lifestyle to reduce recurrence of condition will improve, Ability to verbalize feelings will improve and Ability to demonstrate self-control will improve  Physician Treatment Plan for Secondary Diagnosis: Principal Problem:   Bipolar 1 disorder, mixed, moderate ([facility]) Active Problems:   MDD (major depressive disorder), severe ([place])  Long Term Goal(s): Improvement in symptoms so as ready for  discharge  Short Term Goals: Ability to identify and develop effective coping behaviors will improve,  Compliance with prescribed medications will improve and Ability to identify triggers associated with substance abuse/mental health issues will improve  I certify that inpatient services furnished can reasonably be expected to improve the patient's condition.    Lindell Spar, NP, PMHNP, FNP-BC. 3/21/20194:03 PM   I have reviewed NP's Note, assessement, diagnosis and plan, and agree. I have also met with patient and completed suicide risk assessment.   Eleny Cortez is a 42 y/o F with history of treatment for depression and bipolar disorder and cocaine use who was admitted to [facility] voluntarily from [facility] ED after she presented with symptoms of worsening depression, SI with plan to overdose, and worsening use of cocaine. Pt had reported that she had been off of her previous psychotropic medications for the past 6 months and had been experiencing gradually worsening symptoms of depression, anxiety, and mood swings during that time.  Upon initial presentation, pt shares, "I take care of boyfriend's mother who had a major stroke and as a result I have not been taking care of myself mentally and physically. I quit taking my medications about 6 months ago because I couldn't afford them, but then I started using cocaine to suppress my emotions." Pt reports using about $100 per day of cocaine in the recent weeks. She describes depressed mood with fluctuations and irritability. She endorses fluctuant sleep pattern (but in the past she has stayed up for more than 4 days consecutively without sleep outside of the context of substance use), guilty feelings, low energy, poor concentration, and fluctuant appetite. She denies SI, but states that she often thinks "I just wish I would not wake up." She denies any active intention of harming herself or attempting suicide. She denies HI/AH/VH. She endorses  distractibility and mood swings, but denies other symptoms of mania. She denies symptoms of OCD and PTSD. She denies illicit substance use aside from cocaine.  Discussed with patient about treatment options. She reports previously she had done well on Symbyax but she was unable to afford this medication. Discussed with patient that symbyax is combination of prozac and olanzapine, both of which have generic versions, and pt was in agreement to begin trial of both of those medications.  PLAN OF CARE:   -admit to inpatient level of care  - Bipolar I, current episode mixed             - Start olanzapine 38m qhs             - Start prozac 219mpo qDay  - Anxiety              -Start atarax 5070mo q6h prn anxietuy  -Insomnia             - Start trazodone 150m23m qhs prn insomnia (may repeat x1)  - Encourage participation in groups and therapeutic milieu  -Disposition planning will be ongoing   ChriMaris Berger

## 2017-11-05 NOTE — Progress Notes (Signed)
Patient ID: Amanda Carrillo, female   DOB: 02-20-1976, 42 y.o.   MRN: [id number]  D: Patient with blunted affect, mood is depressed,  pt verbalized that his sleep quality last night was fair. Pt reports a good appetite, describes her energy level as low, states that her concentration level is good. Pt rates her depression as 8 (10 being the highest level), rates her hopelessness level as 8 (10 being the highest level, rates her anxiety level as 10 (10 being the highest level). Pt reports left hand pain of 3/10, declines offer of some pain medication at this time. Pt denies SI/HI/AVH. Pt reports that her goal for today is to work on her "deppression, aniety, mood swings", and plans to "focus in groups" to accomplish this goal. Pt is visible in the milieu and is interacting with peers and staff, and participating in activities.  A: Pt being medicated with all meds as scheduled, and is being maintained on Q15 minute safety checks. Pt educated on alternative coping mechanisms for her anxiety and depression and on cocaine use disorder and verbalizes understanding.  R: Will continue to monitor.

## 2017-11-05 NOTE — BHH Group Notes (Signed)
LCSW Group Therapy Note  11/05/2017 1:15pm  Type of Therapy/Topic:  Group Therapy:  Feelings about Diagnosis  Participation Level:  Did Not Attend--pt invited. Chose to remain in bed.    Description of Group:   This group will allow patients to explore their thoughts and feelings about diagnoses they have received. Patients will be guided to explore their level of understanding and acceptance of these diagnoses. Facilitator will encourage patients to process their thoughts and feelings about the reactions of others to their diagnosis and will guide patients in identifying ways to discuss their diagnosis with significant others in their lives. This group will be process-oriented, with patients participating in exploration of their own experiences, giving and receiving support, and processing challenge from other group members.   Therapeutic Goals: 1. Patient will demonstrate understanding of diagnosis as evidenced by identifying two or more symptoms of the disorder 2. Patient will be able to express two feelings regarding the diagnosis 3. Patient will demonstrate their ability to communicate their needs through discussion and/or role play  Summary of Patient Progress:   x    Therapeutic Modalities:   Cognitive Behavioral Therapy Brief Therapy Feelings Identification    Ledell PeoplesHeather N Smart, LCSW 11/05/2017 12:59 PM

## 2017-11-05 NOTE — Progress Notes (Signed)
Pt attended morning orientation/goals group and stated that her goal for the day is to get started on her medication that was adjusted by the doctor.

## 2017-11-06 LAB — URINALYSIS, ROUTINE W REFLEX MICROSCOPIC
BILIRUBIN URINE: NEGATIVE
Glucose, UA: NEGATIVE mg/dL
Ketones, ur: NEGATIVE mg/dL
Nitrite: NEGATIVE
Protein, ur: NEGATIVE mg/dL
SPECIFIC GRAVITY, URINE: 1.024 (ref 1.005–1.030)
pH: 5 (ref 5.0–8.0)

## 2017-11-06 LAB — URINE CULTURE

## 2017-11-06 MED ORDER — TRAZODONE HCL 150 MG PO TABS
150.0000 mg | ORAL_TABLET | Freq: Every day | ORAL | Status: DC
  Administered 2017-11-06 – 2017-11-08 (×3): 150 mg via ORAL
  Filled 2017-11-06 (×2): qty 1
  Filled 2017-11-06: qty 7
  Filled 2017-11-06 (×2): qty 1

## 2017-11-06 NOTE — BHH Group Notes (Signed)
B[facility] Group Notes:  (Nursing/MHT/Case Management/Adjunct)  Date:  11/06/2017  Time:  4:00 pm  Type of Therapy:  Nurse Education  Participation Level:  Active  Participation Quality:  Appropriate  Affect:  Appropriate  Cognitive:  Appropriate  Insight:  Appropriate  Engagement in Group:  Engaged  Modes of Intervention:  Education    Earline MayotteKnight, Vernesha Talbot Shephard 11/06/2017, 6:44 PM

## 2017-11-06 NOTE — Progress Notes (Signed)
UA container given to patient for urine sample per MD order.

## 2017-11-06 NOTE — Progress Notes (Signed)
UA OBTAINED AND PUT IN LAB REFRIGERATOR FOR PICK UP.

## 2017-11-06 NOTE — Progress Notes (Signed)
Writer observed patient sitting in dayroom with other peers watching TV. Writer spoke with her 1:1 and she reports having a bad time getting up today after her sleep medication last night. She was informed that the dosage was decreased for tonight. She reports that she plans to get her medications straight before leaving. She reported that she had stooped them before because she could not afford them and the doctor has worked with her so that she can afford them once discharged. She reports that she has to stay clean or she will be going to prison. She c/o feeling very anxious and she was informed of the next time vistaril is available. Support given and safety maintained on unit with 15 min checks.

## 2017-11-06 NOTE — Tx Team (Signed)
Interdisciplinary Treatment and Diagnostic Plan Update  11/06/2017 Time of Session: 0830AM Amanda Carrillo MRN: [id number]  Principal Diagnosis: Bipolar 1 disorder, mixed, moderate (HCC)  Secondary Diagnoses: Principal Problem:   Bipolar 1 disorder, mixed, moderate (HCC)   Current Medications:  Current Facility-Administered Medications  Medication Dose Route Frequency Provider Last Rate Last Dose  . acetaminophen (TYLENOL) tablet 650 mg  650 mg Oral Q6H PRN Rankin, Shuvon B, NP   650 mg at 11/06/17 0809  . alum & mag hydroxide-simeth (MAALOX/MYLANTA) 200-200-20 MG/5ML suspension 30 mL  30 mL Oral Q4H PRN Rankin, Shuvon B, NP      . cephALEXin (KEFLEX) capsule 500 mg  500 mg Oral Q8H Simon, Spencer E, PA-C   500 mg at 11/06/17 6962  . feeding supplement (ENSURE ENLIVE) (ENSURE ENLIVE) liquid 237 mL  237 mL Oral Daily PRN Cobos, Fernando A, MD      . FLUoxetine (PROZAC) capsule 20 mg  20 mg Oral Daily Micheal Likens, MD   20 mg at 11/06/17 0807  . hydrOXYzine (ATARAX/VISTARIL) tablet 50 mg  50 mg Oral Q6H PRN Armandina Stammer I, NP   50 mg at 11/05/17 1423  . magnesium hydroxide (MILK OF MAGNESIA) suspension 30 mL  30 mL Oral Daily PRN Rankin, Shuvon B, NP      . multivitamin with minerals tablet 1 tablet  1 tablet Oral Daily Cobos, Rockey Situ, MD   1 tablet at 11/06/17 0807  . nicotine (NICODERM CQ - dosed in mg/24 hours) patch 21 mg  21 mg Transdermal Daily Donell Sievert E, PA-C   21 mg at 11/06/17 9528  . OLANZapine (ZYPREXA) tablet 10 mg  10 mg Oral QHS Micheal Likens, MD   10 mg at 11/05/17 2211  . traZODone (DESYREL) tablet 150 mg  150 mg Oral QHS,MR X 1 Nwoko, Agnes I, NP   150 mg at 11/05/17 2211   PTA Medications: Medications Prior to Admission  Medication Sig Dispense Refill Last Dose  . Buprenorphine HCl-Naloxone HCl (SUBOXONE) 8-2 MG FILM Place 1 Film under the tongue 2 (two) times daily. 14 each 0   . metoCLOPramide (REGLAN) 10 MG tablet Take 1 tablet (10 mg  total) by mouth every 6 (six) hours as needed for nausea or vomiting. 6 tablet 0   . ondansetron (ZOFRAN) 4 MG tablet Take 1 tablet (4 mg total) by mouth every 6 (six) hours. 12 tablet 0     Patient Stressors: Financial difficulties Health problems Medication change or noncompliance Substance abuse  Patient Strengths: Ability for insight Capable of independent living General fund of knowledge Supportive family/friends  Treatment Modalities: Medication Management, Group therapy, Case management,  1 to 1 session with clinician, Psychoeducation, Recreational therapy.   Physician Treatment Plan for Primary Diagnosis: Bipolar 1 disorder, mixed, moderate (HCC) Long Term Goal(s): Improvement in symptoms so as ready for discharge Improvement in symptoms so as ready for discharge   Short Term Goals: Ability to identify changes in lifestyle to reduce recurrence of condition will improve Ability to verbalize feelings will improve Ability to demonstrate self-control will improve Ability to identify and develop effective coping behaviors will improve Compliance with prescribed medications will improve Ability to identify triggers associated with substance abuse/mental health issues will improve  Medication Management: Evaluate patient's response, side effects, and tolerance of medication regimen.  Therapeutic Interventions: 1 to 1 sessions, Unit Group sessions and Medication administration.  Evaluation of Outcomes: Progressing  Physician Treatment Plan for Secondary Diagnosis: Principal Problem:  Bipolar 1 disorder, mixed, moderate (HCC)  Long Term Goal(s): Improvement in symptoms so as ready for discharge Improvement in symptoms so as ready for discharge   Short Term Goals: Ability to identify changes in lifestyle to reduce recurrence of condition will improve Ability to verbalize feelings will improve Ability to demonstrate self-control will improve Ability to identify and develop  effective coping behaviors will improve Compliance with prescribed medications will improve Ability to identify triggers associated with substance abuse/mental health issues will improve     Medication Management: Evaluate patient's response, side effects, and tolerance of medication regimen.  Therapeutic Interventions: 1 to 1 sessions, Unit Group sessions and Medication administration.  Evaluation of Outcomes: Progressing   RN Treatment Plan for Primary Diagnosis: Bipolar 1 disorder, mixed, moderate (HCC) Long Term Goal(s): Knowledge of disease and therapeutic regimen to maintain health will improve  Short Term Goals: Ability to remain free from injury will improve, Ability to disclose and discuss suicidal ideas and Ability to identify and develop effective coping behaviors will improve  Medication Management: RN will administer medications as ordered by provider, will assess and evaluate patient's response and provide education to patient for prescribed medication. RN will report any adverse and/or side effects to prescribing provider.  Therapeutic Interventions: 1 on 1 counseling sessions, Psychoeducation, Medication administration, Evaluate responses to treatment, Monitor vital signs and CBGs as ordered, Perform/monitor CIWA, COWS, AIMS and Fall Risk screenings as ordered, Perform wound care treatments as ordered.  Evaluation of Outcomes: Progressing   LCSW Treatment Plan for Primary Diagnosis: Bipolar 1 disorder, mixed, moderate (HCC) Long Term Goal(s): Safe transition to appropriate next level of care at discharge, Engage patient in therapeutic group addressing interpersonal concerns.  Short Term Goals: Engage patient in aftercare planning with referrals and resources, Facilitate patient progression through stages of change regarding substance use diagnoses and concerns and Identify triggers associated with mental health/substance abuse issues  Therapeutic Interventions: Assess for  all discharge needs, 1 to 1 time with Social worker, Explore available resources and support systems, Assess for adequacy in community support network, Educate family and significant other(s) on suicide prevention, Complete Psychosocial Assessment, Interpersonal group therapy.  Evaluation of Outcomes: Progressing   Progress in Treatment: Attending groups: Intermittently Participating in groups: Yes. When she attends Taking medication as prescribed: Yes. Toleration medication: Yes. Family/Significant other contact made: Contact attempts made with pt's fiance.SPE completed with pt.  Patient understands diagnosis: Yes. Discussing patient identified problems/goals with staff: Yes. Medical problems stabilized or resolved: Yes. Denies suicidal/homicidal ideation: Yes, passive SI.  Issues/concerns per patient self-inventory: No.  Other: n/a   New problem(s) identified: No, Describe:  n/a  New Short Term/Long Term Goal(s): detox, medication management for mood stabilization; elimination of SI thoughts; development of comprehensive mental wellness/sobriety plan.   Patient Goal: "To get sober and back on medications."  Discharge Plan or Barriers: Pt plans to return home with fiance and family. Pt [facility] for medication management and therapy. AA/NA information provided and MHAG pamphlet provided for additional community support.   Reason for Continuation of Hospitalization: Anxiety Depression Medication stabilization Suicidal ideation Withdrawal symptoms  Estimated Length of Stay: Monday, 11/09/17  Attendees: Patient: Amanda Carrillo 11/06/2017 8:50 AM  Physician: Dr. Altamese Carolinaainville MD; Dr. Jama Flavorsobos MD 11/06/2017 8:50 AM  Nursing: Meriam SpragueBeverly RN: Clydie BraunKaren RN 11/06/2017 8:50 AM  RN Care Manager:x 11/06/2017 8:50 AM  Social Worker: Chartered loss adjusterHeather Smart, LCSW 11/06/2017 8:50 AM  Recreational Therapist: x 11/06/2017 8:50 AM  Other: Armandina StammerAgnes Nwoko NP  11/06/2017 8:50 AM  Other:  11/06/2017 8:50  AM  Other: 11/06/2017 8:50 AM    Scribe for Treatment Team: Ledell Peoples Smart, LCSW 11/06/2017 8:50 AM

## 2017-11-06 NOTE — Progress Notes (Signed)
[facility] MD Progress Note  11/06/2017 1:59 PM Amanda Carrillo  MRN:  [id number]  Subjective: Amanda Carrillo reports, "My Trazodone knocked me out. That is the reason I'm still in bed. The 300 mg is too much for me. Can it be cut down a bit. I'm doing fine emotionally. The combination of Olanzapine & Prozac are good for me. I'm waiting for them to kick in. I will continue substance abuse treatment on an outpatient basis with [facility]. The Social worker had informed me that She will be making appointment for [facility] [place] [facility] [place]".  Amanda Carrillo is a 42 y/o F with history of treatment for depression and bipolar disorder and cocaine use who [facility] after she presented with symptoms of worsening depression, SI with plan to overdose, and worsening use of cocaine. Pt had reported that she had been off of her previous psychotropic medications for the past 6 months and had been experiencing gradually worsening symptoms of depression, anxiety, and mood swings during that time.  11-06-17, Amanda Carrillo is seen, chart reviewed. The chart findings discussed with the treatment team. She is lying down in her bed. She is complaining of hypersomnia, blamed it on the high dose of Trazodone. Would like the Trazodone dose lowered. She reports improved mood. Denies any SIHI, AVH, delusional thoughts or paranoia. She says she has not been out of her room today, plans to be up by lunch time. Says she attended & participated in the group counseling sessions yesterday. She is taking & tolerating her treatment regimen so far, other than the effects of the trazodone which will be handled today by lowering Trazodone from 300 mg to 150 mg Q hs starting tonight. Amanda Carrillo continues to maintain that she will continue substance abuse treatment on an outpatient basis after discharge.  Principal Problem: Bipolar 1 disorder, mixed, moderate (HCC)  Diagnosis:   Patient Active  Problem List   Diagnosis Date Noted  . Bipolar 1 disorder, mixed, moderate (HCC) [F31.62] 11/05/2017  . MDD (major depressive disorder), severe (HCC) [F32.2] 11/04/2017  . Opioid use disorder, severe, dependence (HCC) [F11.20] 08/07/2017  . Opioid use disorder (HCC) [F11.99] 08/04/2017  . Depression [F32.9]    Total Time spent with patient: 25 minutes  Past Psychiatric History: See H&P  Past Medical History:  Past Medical History:  Diagnosis Date  . ADHD (attention deficit hyperactivity disorder)   . Anxiety   . Depression   . Gastroparesis 01/2017  . Polysubstance abuse (HCC)    herion, methadone, etoh    Past Surgical History:  Procedure Laterality Date  . CHOLECYSTECTOMY    . TUBAL LIGATION    . TUBAL LIGATION     Family History:  Family History  Problem Relation Age of Onset  . Pneumonia Other   . Depression Mother    Family Psychiatric  History: See H&P  Social History:  Social History   Substance and Sexual Activity  Alcohol Use No     Social History   Substance and Sexual Activity  Drug Use Yes  . Types: IV, Cocaine   Comment: herion (former per pt)    Social History   Socioeconomic History  . Marital status: Divorced    Spouse name: Not on file  . Number of children: Not on file  . Years of education: Not on file  . Highest education level: Not on file  Occupational History  . Not on  file  Social Needs  . Financial resource strain: Not on file  . Food insecurity:    Worry: Not on file    Inability: Not on file  . Transportation needs:    Medical: Not on file    Non-medical: Not on file  Tobacco Use  . Smoking status: Current Every Day Smoker    Packs/day: 2.00    Types: Cigarettes  . Smokeless tobacco: Never Used  Substance and Sexual Activity  . Alcohol use: No  . Drug use: Yes    Types: IV, Cocaine    Comment: herion (former per pt)  . Sexual activity: Yes    Birth control/protection: Surgical  Lifestyle  . Physical activity:     Days per week: Not on file    Minutes per session: Not on file  . Stress: Not on file  Relationships  . Social connections:    Talks on phone: Not on file    Gets together: Not on file    Attends religious service: Not on file    Active member of club or organization: Not on file    Attends meetings of clubs or organizations: Not on file    Relationship status: Not on file  Other Topics Concern  . Not on file  Social History Narrative  . Not on file   Additional Social History:  Pain Medications: denies currently Prescriptions: denies currently Over the Counter: denies currently History of alcohol / drug use?: Yes Longest period of sobriety (when/how long): Unknown Name of Substance 1: Cocaine 1 - Age of First Use: Early 75s 1 - Amount (size/oz): $100/day 1 - Frequency: Daily for the last two months 1 - Duration: Unknown 1 - Last Use / Amount: 11/02/17 Name of Substance 2: Marijuana 2 - Age of First Use: 17 2 - Amount (size/oz): 1-2 bowls 2 - Frequency: Every couple of months 2 - Duration: Unknown 2 - Last Use / Amount: 10/07/17  Sleep: Good  Appetite:  Good  Current Medications: Current Facility-Administered Medications  Medication Dose Route Frequency Provider Last Rate Last Dose  . acetaminophen (TYLENOL) tablet 650 mg  650 mg Oral Q6H PRN Rankin, Shuvon B, NP   650 mg at 11/06/17 0809  . alum & mag hydroxide-simeth (MAALOX/MYLANTA) 200-200-20 MG/5ML suspension 30 mL  30 mL Oral Q4H PRN Rankin, Shuvon B, NP      . cephALEXin (KEFLEX) capsule 500 mg  500 mg Oral Q8H Simon, Spencer E, PA-C   500 mg at 11/06/17 1610  . feeding supplement (ENSURE ENLIVE) (ENSURE ENLIVE) liquid 237 mL  237 mL Oral Daily PRN Cobos, Fernando A, MD      . FLUoxetine (PROZAC) capsule 20 mg  20 mg Oral Daily Micheal Likens, MD   20 mg at 11/06/17 0807  . hydrOXYzine (ATARAX/VISTARIL) tablet 50 mg  50 mg Oral Q6H PRN Armandina Stammer I, NP   50 mg at 11/05/17 1423  . magnesium  hydroxide (MILK OF MAGNESIA) suspension 30 mL  30 mL Oral Daily PRN Rankin, Shuvon B, NP      . multivitamin with minerals tablet 1 tablet  1 tablet Oral Daily Cobos, Rockey Situ, MD   1 tablet at 11/06/17 0807  . nicotine (NICODERM CQ - dosed in mg/24 hours) patch 21 mg  21 mg Transdermal Daily Donell Sievert E, PA-C   21 mg at 11/06/17 9604  . OLANZapine (ZYPREXA) tablet 10 mg  10 mg Oral QHS Micheal Likens, MD   10 mg  at 11/05/17 2211  . traZODone (DESYREL) tablet 150 mg  150 mg Oral QHS,MR X 1 Stella Bortle I, NP   150 mg at 11/05/17 2211   Lab Results:  Results for orders placed or performed during the hospital encounter of 11/03/17 (from the past 48 hour(s))  Urinalysis, Routine w reflex microscopic     Status: Abnormal   Collection Time: 11/04/17  4:00 PM  Result Value Ref Range   Color, Urine YELLOW YELLOW   APPearance HAZY (A) CLEAR   Specific Gravity, Urine 1.018 1.005 - 1.030   pH 5.0 5.0 - 8.0   Glucose, UA NEGATIVE NEGATIVE mg/dL   Hgb urine dipstick SMALL (A) NEGATIVE   Bilirubin Urine NEGATIVE NEGATIVE   Ketones, ur NEGATIVE NEGATIVE mg/dL   Protein, ur NEGATIVE NEGATIVE mg/dL   Nitrite POSITIVE (A) NEGATIVE   Leukocytes, UA LARGE (A) NEGATIVE   RBC / HPF 0-5 0 - 5 RBC/hpf   WBC, UA 6-30 0 - 5 WBC/hpf   Bacteria, UA MANY (A) NONE SEEN   Squamous Epithelial / LPF 6-30 (A) NONE SEEN   Mucus PRESENT     Comment: Performed at [facility], [address] [place], [place] [address]  Urine culture     Status: Abnormal   Collection Time: 11/04/17  4:00 PM  Result Value Ref Range   Specimen Description      URINE, RANDOM Performed at [facility], [address], [place], [place] [address]    Special Requests      NONE Performed at [facility], [address], [place], [place] [address]    Culture MULTIPLE SPECIES PRESENT, SUGGEST RECOLLECTION (A)    Report Status 11/06/2017 FINAL    Blood Alcohol level:  Lab Results  Component Value Date   ETH <10  11/03/2017   ETH 40 (H) 07/08/2015   Metabolic Disorder Labs: No results found for: HGBA1C, MPG No results found for: PROLACTIN No results found for: CHOL, TRIG, HDL, CHOLHDL, VLDL, LDLCALC  Physical Findings: AIMS:  , ,  ,  ,    CIWA:  CIWA-Ar Total: 5 COWS:  COWS Total Score: 6  Musculoskeletal: Strength & Muscle Tone: within normal limits Gait & Station: normal Patient leans: N/A  Psychiatric Specialty Exam: Physical Exam  Review of Systems  Psychiatric/Behavioral: Positive for depression ("Improving") and substance abuse (Hx. Cocaine use disorder). Negative for hallucinations, memory loss and suicidal ideas. The patient is not nervous/anxious and does not have insomnia ("Improved").     Blood pressure (!) 100/59, pulse 94, temperature 98.7 F (37.1 C), temperature source Oral, resp. rate 16, height 5\' 7"  (1.702 m), weight 82.1 kg (181 lb).Body mass index is 28.35 kg/m.  General Appearance: Casual and Fairly Groomed  Eye Contact:  Good  Speech:  Clear and Coherent and Normal Rate  Volume:  Normal  Mood:  Anxious and Depressed  Affect:  Appropriate, Congruent and Labile  Thought Process:  Coherent and Goal Directed  Orientation:  Full (Time, Place, and Person)  Thought Content:  Logical  Suicidal Thoughts:  Yes.  without intent/plan  Homicidal Thoughts:  No  Memory:  Immediate;   Fair Recent;   Fair Remote;   Fair  Judgement:  Poor  Insight:  Lacking  Psychomotor Activity:  Normal  Concentration:  Concentration: Fair  Recall:  [employer] of Knowledge:  Fair  Language:  Fair  Akathisia:  No  Handed:    AIMS (if indicated):     Assets:  Resilience  ADL's:  Intact  Cognition:  WNL  Sleep:  Number of Hours: 6.75     Treatment Plan Summary: Daily contact with patient to assess and evaluate symptoms and progress in treatment. Continue inpatient hospitalization.  Will continue today 11/06/2017 plan as below except where it is noted.  Depression.      - Continue  Prozac 20 mg po daily for depression.  Anxiety.     - Continue Vistaril 50 mg po qid prn.  Mood control.     - Continue Olanzapine 10 mg po Q hs.  Insomnia.      - Decreased Trazodone from 300 mg to Trazodone 150 mg po Q hs.  Urinary Tract infection.      - Continue Keflex capsul 500 mg po tid.       - Patient will continue to participate in the group counseling sessions.      - Discharge disposition on going.  Armandina Stammer, NP, pmhnp, fnp-bc. 11/06/2017, 1:59 PM

## 2017-11-06 NOTE — Progress Notes (Signed)
Adult Psychoeducational Group Note  Date:  11/06/2017 Time:  8:37 PM  Group Topic/Focus:  Wrap-Up Group:   The focus of this group is to help patients review their daily goal of treatment and discuss progress on daily workbooks.  Participation Level:  Active  Participation Quality:  Appropriate  Affect:  Appropriate  Cognitive:  Appropriate  Insight: Appropriate  Engagement in Group:  Engaged  Modes of Intervention:  Discussion  Additional Comments:  Patient attended AA Group meeting and participated.   Amanda Carrillo 11/06/2017, 8:37 PM

## 2017-11-06 NOTE — [facility] Group Notes (Signed)
LCSW Group Therapy Note 11/06/2017 2:24 PM  Type of Therapy and Topic: Group Therapy: Feelings around Relapse and Recovery  Participation Level: Did Not Attend   Description of Group:  Patients in this group will discuss emotions they experience before and after a relapse. They will process how experiencing these feelings, or avoidance of experiencing them, relates to having a relapse. Facilitator will guide patients to explore emotions they have related to recovery. Patients will be encouraged to process which emotions are more powerful. They will be guided to discuss the emotional reaction significant others in their lives may have to their relapse or recovery. Patients will be assisted in exploring ways to respond to the emotions of others without this contributing to a relapse.  Therapeutic Goals: 1. Patient will identify two or more emotions that lead to a relapse for them 2. Patient will identify two emotions that result when they relapse 3. Patient will identify two emotions related to recovery 4. Patient will demonstrate ability to communicate their needs through discussion and/or role plays  Summary of Patient Progress:  Invited, chose not to attend.    Therapeutic Modalities:  Cognitive Behavioral Therapy Solution-Focused Therapy Assertiveness Training Relapse Prevention Therapy   Alcario DroughtJolan Toyia Jelinek LCSWA Clinical Social Worker

## 2017-11-06 NOTE — Progress Notes (Signed)
D:  Patient denied SI and HI, contracts for safety.  Denied A/V hallucinations.   A:  Medications administered per MD orders.  Emotional support and encouragement given patient. R:  Safety maintained with 15 minute checks.  

## 2017-11-06 NOTE — Progress Notes (Signed)
Recreation Therapy Notes  Date: 3.22.19 Time: 9:30 a.m. Location: [address] Dayroom   Group Topic: Stress Management   Goal Area(s) Addresses:  Goal 1.1: To reduce stress  -Patient will report feeling a reduction in stress level  -Patient will identify the importance of stress management  -Patient will participate during stress management group treatment     Intervention: Stress Management   Activity: Meditattion- Patients were in a peaceful environment with soft lighting enhancing patients mood. Patients listened to mindfulness voice over to decrease stress levels   Education: Stress Management, Discharge Planning.    Education Outcome: Acknowledges edcuation/In group clarification offered/Needs additional education   Clinical Observations/Feedback:: Patient did not attend     Sheryle HailDarian Quamere Mussell, Recreation Therapy Intern   Sheryle HailDarian Kesleigh Morson 11/06/2017 11:27 AM

## 2017-11-06 NOTE — Plan of Care (Signed)
Nurse discussed depression, anxiety, coping skills with patient.  

## 2017-11-07 ENCOUNTER — Encounter (HOSPITAL_COMMUNITY): Payer: Self-pay | Admitting: Registered Nurse

## 2017-11-07 DIAGNOSIS — R45851 Suicidal ideations: Secondary | ICD-10-CM

## 2017-11-07 DIAGNOSIS — N390 Urinary tract infection, site not specified: Secondary | ICD-10-CM

## 2017-11-07 MED ORDER — DIPHENHYDRAMINE HCL 50 MG PO CAPS
50.0000 mg | ORAL_CAPSULE | Freq: Once | ORAL | Status: AC
  Administered 2017-11-07: 50 mg via ORAL
  Filled 2017-11-07: qty 1
  Filled 2017-11-07: qty 2

## 2017-11-07 MED ORDER — GABAPENTIN 300 MG PO CAPS
300.0000 mg | ORAL_CAPSULE | Freq: Three times a day (TID) | ORAL | Status: DC
  Administered 2017-11-07 – 2017-11-08 (×4): 300 mg via ORAL
  Filled 2017-11-07 (×9): qty 1

## 2017-11-07 MED ORDER — HYDROXYZINE HCL 25 MG PO TABS
25.0000 mg | ORAL_TABLET | Freq: Four times a day (QID) | ORAL | Status: DC | PRN
  Administered 2017-11-07 – 2017-11-08 (×3): 25 mg via ORAL
  Filled 2017-11-07 (×3): qty 1
  Filled 2017-11-07: qty 10

## 2017-11-07 MED ORDER — HYDROCORTISONE 1 % EX CREA
TOPICAL_CREAM | Freq: Three times a day (TID) | CUTANEOUS | Status: DC | PRN
  Administered 2017-11-08: 07:00:00 via TOPICAL
  Filled 2017-11-07: qty 28

## 2017-11-07 NOTE — Plan of Care (Signed)
Pt reports that she slept good with sleep medication given.
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
## 2017-11-07 NOTE — Progress Notes (Addendum)
[facility] MD Progress Note  11/07/2017 10:33 AM Amanda Carrillo  MRN:  [id number]  Subjective: "I do not feel like the Vistaril is working.  I was on gabapentin before for anxiety and it worked a lot better.  It just feels like at times my heart is racing, feel like something is sitting on the chest, and I cannot keep my legs still."  "I feel a lot better since the dosage of the trazodone was decreased to 150 mg.  I do not feel this tired this morning.  Yesterday I felt like I drag along all day and I can get out of the bed until about 2:00."  Objective:Amanda Carrillo, 42 y.o., female patient  seen face to face by this provider; chart reviewed and discussed with Dr. Jama Flavors and treatment team on 11/07/17.  On evaluation Amanda Carrillo reports that she is sleeping and eating better.  Reports that she is attending group and participating; and tolerating her medications without adverse reactions.  Patient denies suicidal/self-harm/homicidal ideations, psychosis, and paranoia.  Patient has a reddened area on her upper inner left bicep states redness started after she got the flu shot, but did not noticed how bad it was until this morning.  Area was marked with a black pin going around the circumference of the reddened area; patient and nurse was informed to let the doctor know if redness when outside the area marked or if worsened.   Principal Problem: Bipolar 1 disorder, mixed, moderate (HCC)  Diagnosis:   Patient Active Problem List   Diagnosis Date Noted  . Bipolar 1 disorder, mixed, moderate (HCC) [F31.62] 11/05/2017  . MDD (major depressive disorder), severe (HCC) [F32.2] 11/04/2017  . Opioid use disorder, severe, dependence (HCC) [F11.20] 08/07/2017  . Opioid use disorder (HCC) [F11.99] 08/04/2017  . Depression [F32.9]    Total Time spent with patient: 20 minutes  Past Psychiatric History: See H&P  Past Medical History:  Past Medical History:  Diagnosis Date  . ADHD (attention deficit hyperactivity  disorder)   . Anxiety   . Depression   . Gastroparesis 01/2017  . Polysubstance abuse (HCC)    herion, methadone, etoh    Past Surgical History:  Procedure Laterality Date  . CHOLECYSTECTOMY    . TUBAL LIGATION    . TUBAL LIGATION     Family History:  Family History  Problem Relation Age of Onset  . Pneumonia Other   . Depression Mother    Family Psychiatric  History: See H&P  Social History:  Social History   Substance and Sexual Activity  Alcohol Use No     Social History   Substance and Sexual Activity  Drug Use Yes  . Types: IV, Cocaine   Comment: herion (former per pt)    Social History   Socioeconomic History  . Marital status: Divorced    Spouse name: Not on file  . Number of children: Not on file  . Years of education: Not on file  . Highest education level: Not on file  Occupational History  . Not on file  Social Needs  . Financial resource strain: Not on file  . Food insecurity:    Worry: Not on file    Inability: Not on file  . Transportation needs:    Medical: Not on file    Non-medical: Not on file  Tobacco Use  . Smoking status: Current Every Day Smoker    Packs/day: 2.00    Types: Cigarettes  . Smokeless tobacco: Never  Used  Substance and Sexual Activity  . Alcohol use: No  . Drug use: Yes    Types: IV, Cocaine    Comment: herion (former per pt)  . Sexual activity: Yes    Birth control/protection: Surgical  Lifestyle  . Physical activity:    Days per week: Not on file    Minutes per session: Not on file  . Stress: Not on file  Relationships  . Social connections:    Talks on phone: Not on file    Gets together: Not on file    Attends religious service: Not on file    Active member of club or organization: Not on file    Attends meetings of clubs or organizations: Not on file    Relationship status: Not on file  Other Topics Concern  . Not on file  Social History Narrative  . Not on file   Additional Social History:   Pain Medications: denies currently Prescriptions: denies currently Over the Counter: denies currently History of alcohol / drug use?: Yes Longest period of sobriety (when/how long): Unknown Name of Substance 1: Cocaine 1 - Age of First Use: Early 68s 1 - Amount (size/oz): $100/day 1 - Frequency: Daily for the last two months 1 - Duration: Unknown 1 - Last Use / Amount: 11/02/17 Name of Substance 2: Marijuana 2 - Age of First Use: 17 2 - Amount (size/oz): 1-2 bowls 2 - Frequency: Every couple of months 2 - Duration: Unknown 2 - Last Use / Amount: 10/07/17  Sleep: Good  Appetite:  Good  Current Medications: Current Facility-Administered Medications  Medication Dose Route Frequency Provider Last Rate Last Dose  . acetaminophen (TYLENOL) tablet 650 mg  650 mg Oral Q6H PRN Rankin, Shuvon B, NP   650 mg at 11/06/17 1609  . alum & mag hydroxide-simeth (MAALOX/MYLANTA) 200-200-20 MG/5ML suspension 30 mL  30 mL Oral Q4H PRN Rankin, Shuvon B, NP      . cephALEXin (KEFLEX) capsule 500 mg  500 mg Oral Q8H Simon, Spencer E, PA-C   500 mg at 11/07/17 8119  . feeding supplement (ENSURE ENLIVE) (ENSURE ENLIVE) liquid 237 mL  237 mL Oral Daily PRN Cobos, Fernando A, MD      . FLUoxetine (PROZAC) capsule 20 mg  20 mg Oral Daily Micheal Likens, MD   20 mg at 11/07/17 0757  . gabapentin (NEURONTIN) capsule 300 mg  300 mg Oral TID Rankin, Shuvon B, NP      . hydrOXYzine (ATARAX/VISTARIL) tablet 25 mg  25 mg Oral Q6H PRN Rankin, Shuvon B, NP      . magnesium hydroxide (MILK OF MAGNESIA) suspension 30 mL  30 mL Oral Daily PRN Rankin, Shuvon B, NP      . multivitamin with minerals tablet 1 tablet  1 tablet Oral Daily Cobos, Rockey Situ, MD   1 tablet at 11/07/17 0757  . nicotine (NICODERM CQ - dosed in mg/24 hours) patch 21 mg  21 mg Transdermal Daily Donell Sievert E, PA-C   21 mg at 11/07/17 0757  . OLANZapine (ZYPREXA) tablet 10 mg  10 mg Oral QHS Micheal Likens, MD   10 mg at  11/06/17 2125  . traZODone (DESYREL) tablet 150 mg  150 mg Oral QHS Armandina Stammer I, NP   150 mg at 11/06/17 2126   Lab Results:  Results for orders placed or performed during the hospital encounter of 11/04/17 (from the past 48 hour(s))  Urinalysis, Routine w reflex microscopic  Status: Abnormal   Collection Time: 11/06/17  5:41 PM  Result Value Ref Range   Color, Urine YELLOW YELLOW   APPearance CLOUDY (A) CLEAR   Specific Gravity, Urine 1.024 1.005 - 1.030   pH 5.0 5.0 - 8.0   Glucose, UA NEGATIVE NEGATIVE mg/dL   Hgb urine dipstick SMALL (A) NEGATIVE   Bilirubin Urine NEGATIVE NEGATIVE   Ketones, ur NEGATIVE NEGATIVE mg/dL   Protein, ur NEGATIVE NEGATIVE mg/dL   Nitrite NEGATIVE NEGATIVE   Leukocytes, UA LARGE (A) NEGATIVE   RBC / HPF 0-5 0 - 5 RBC/hpf   WBC, UA 6-30 0 - 5 WBC/hpf   Bacteria, UA RARE (A) NONE SEEN   Squamous Epithelial / LPF 6-30 (A) NONE SEEN   Mucus PRESENT     Comment: Performed at [facility], [address] [place], [place] [id number]   Blood Alcohol level:  Lab Results  Component Value Date   ETH <10 11/03/2017   ETH 40 (H) 07/08/2015   Metabolic Disorder Labs: No results found for: HGBA1C, MPG No results found for: PROLACTIN No results found for: CHOL, TRIG, HDL, CHOLHDL, VLDL, LDLCALC  Physical Findings: AIMS: Facial and Oral Movements Muscles of Facial Expression: None, normal Lips and Perioral Area: None, normal Jaw: None, normal Tongue: None, normal,Extremity Movements Upper (arms, wrists, hands, fingers): None, normal Lower (legs, knees, ankles, toes): None, normal, Trunk Movements Neck, shoulders, hips: None, normal, Overall Severity Severity of abnormal movements (highest score from questions above): None, normal Incapacitation due to abnormal movements: None, normal Patient's awareness of abnormal movements (rate only patient's report): No Awareness, Dental Status Current problems with teeth and/or dentures?:  No Does patient usually wear dentures?: No  CIWA:  CIWA-Ar Total: 1 COWS:  COWS Total Score: 2  Musculoskeletal: Strength & Muscle Tone: within normal limits Gait & Station: normal Patient leans: N/A  Psychiatric Specialty Exam: Physical Exam  Nursing note and vitals reviewed. Constitutional: She appears well-developed and well-nourished.  HENT:  Head: Normocephalic.  Neck: Normal range of motion. Neck supple.  Respiratory: Effort normal.  Skin: Skin is warm and dry. There is erythema.  Left inner bicep written, warm to touch, and slightly tender with palpation.  Area marked with black pin patient and RN informed to let MD. know if the redness goes outside the area marked or if condition worsens.    Review of Systems  Psychiatric/Behavioral: Positive for depression ("Improving") and substance abuse (Hx. Cocaine use disorder). Negative for hallucinations, memory loss and suicidal ideas. The patient is nervous/anxious. The patient does not have insomnia ("Improved").   All other systems reviewed and are negative.   Blood pressure 108/90, pulse (!) 113, temperature 98.5 F (36.9 C), temperature source Oral, resp. rate 16, height 5\' 7"  (1.702 m), weight 82.1 kg (181 lb).Body mass index is 28.35 kg/m.  General Appearance: Casual and Fairly Groomed  Eye Contact:  Good  Speech:  Clear and Coherent and Normal Rate  Volume:  Normal  Mood:  Anxious and Depressed  Affect:  Appropriate, Congruent and Labile  Thought Process:  Coherent and Goal Directed  Orientation:  Full (Time, Place, and Person)  Thought Content:  Logical  Suicidal Thoughts:  Yes.  without intent/plan  Homicidal Thoughts:  No  Memory:  Immediate;    good Recent;    good Remote;    good  Judgement:   Fair   Insight:   fair  Psychomotor Activity:  Normal  Concentration:  Concentration: Fair  Recall:  Good  Fund of Knowledge:  Good  Language:   Good  Akathisia:  No  Handed:    AIMS (if indicated):     Assets:   Resilience, communication, goal-directed  ADL's:  Intact  Cognition:  WNL  Sleep:  Number of Hours: 6.75     Treatment Plan Summary: Daily contact with patient to assess and evaluate symptoms and progress in treatment.    Continue inpatient hospitalization.  Will continue today 11/07/2017 plan as below except where it is noted.  Depression.      - Continue Prozac 20 mg po daily for depression.  Anxiety.     - Decreased Vistaril 25 mg po qid prn.     - Started Gabapentin 300 mg Tid  Mood control.     - Continue Olanzapine 10 mg po Q hs.  Insomnia.      - Continue Trazodone 150 mg po Q hs.  Urinary Tract infection.      - Continue Keflex capsul 500 mg po tid.       - Patient will continue to participate in the group counseling sessions.      - Discharge disposition on going.  Shuvon Rankin, NP 11/07/2017, 10:33 AM   Agree with NP Progress Note

## 2017-11-07 NOTE — Progress Notes (Signed)
Adult Psychoeducational Group Note  Date:  11/07/2017 Time:  10:03 PM  Group Topic/Focus:  Wrap-Up Group:   The focus of this group is to help patients review their daily goal of treatment and discuss progress on daily workbooks.  Participation Level:  Active  Participation Quality:  Appropriate  Affect:  Appropriate  Cognitive:  Appropriate  Insight: Appropriate  Engagement in Group:  Engaged  Modes of Intervention:  Discussion  Additional Comments:  Patient attended AA group meeting.  Zaina Jenkin W Vala Raffo 11/07/2017, 10:03 PM

## 2017-11-07 NOTE — Progress Notes (Signed)
D:Pt reports that she slept good last night. She rates depression and anxiety as an 8 on 0-10 scale with 10 being the most. Pt c/o lt arm pain that felt warm to the touch.  A:Support and encouragement given. PRN vistaril given for anxiety along with an ice pack for lt arm discomfort.  R:Pt denies si and hi. No hallucinations observed or expressed. Safety maintained on the unit.

## 2017-11-07 NOTE — Progress Notes (Signed)
Writer spoke with patient 1:1 and she c/o left arm itching from flu shot given on 3/21. This issue was addressed on previous shift but patient reports it continues to itch. Writer received an order for benadryl to be given which she took along with hs medications. She reports that she has felt very anxious on today and became very upset crying after attempting to reach her fiance. Writer spoke with her about this incident and she calmed herself. She has been in the dayroom talking with other peers until bedtime. Safety maintained on unit with 15 min checks.

## 2017-11-07 NOTE — [facility] Group Notes (Signed)
[facility] Group Notes:  (Nursing)  Date:  11/07/2017  Time:  1:30 PM Type of Therapy:  Nurse Education  Participation Level:  Active  Participation Quality:  Appropriate  Affect:  Appropriate  Cognitive:  Alert and Appropriate  Insight:  Appropriate and Good  Engagement in Group:  Engaged  Modes of Intervention:  Discussion and Education  Summary of Progress/Problems: Patient was appropriate and actively engaged in nurse led aromatherapy education group Shela NevinValerie S Sinclair Arrazola 11/07/2017, 2:30 PM

## 2017-11-08 DIAGNOSIS — F3162 Bipolar disorder, current episode mixed, moderate: Principal | ICD-10-CM

## 2017-11-08 MED ORDER — ONDANSETRON 4 MG PO TBDP
4.0000 mg | ORAL_TABLET | Freq: Three times a day (TID) | ORAL | Status: DC | PRN
  Administered 2017-11-08 – 2017-11-09 (×2): 4 mg via ORAL
  Filled 2017-11-08 (×2): qty 1

## 2017-11-08 MED ORDER — FLUTICASONE PROPIONATE 50 MCG/ACT NA SUSP
1.0000 | Freq: Every day | NASAL | Status: DC
  Administered 2017-11-08 – 2017-11-09 (×2): 1 via NASAL
  Filled 2017-11-08 (×2): qty 16

## 2017-11-08 MED ORDER — GABAPENTIN 400 MG PO CAPS
400.0000 mg | ORAL_CAPSULE | Freq: Three times a day (TID) | ORAL | Status: DC
  Administered 2017-11-08 – 2017-11-09 (×3): 400 mg via ORAL
  Filled 2017-11-08 (×3): qty 21
  Filled 2017-11-08 (×5): qty 1

## 2017-11-08 NOTE — [facility] Group Notes (Addendum)
Adult Psychoeducational Group Note  Date:  11/08/2017 Time:  9:08 AM  Group Topic/Focus:  Orientation:   The focus of this group is to educate the patient on the purpose and policies of crisis stabilization and provide a format to answer questions about their admission.  The group details unit policies and expectations of patients while admitted.  Participation Level:  Active  Participation Quality:  Appropriate  Affect:  Appropriate  Cognitive:  Appropriate  Insight: Appropriate  Engagement in Group:  Engaged and Supportive  Modes of Intervention:  Discussion, Education, Exploration, Problem-solving, Socialization and Support  Additional Comments:  Pt attended and participated during Orientation/Goals group. Pt stated that her goal for today is to work on her anger and anxiety.  Tania Adedams, Detria Cummings C 11/08/2017, 9:08 AM

## 2017-11-08 NOTE — Progress Notes (Signed)
Patient c/o nausea and upset stomach.  States she gets gastroparesis.  Administered zofran 4 mg for relief.

## 2017-11-08 NOTE — [facility] Group Notes (Signed)
[facility] Group Notes:  (Nursing)  Date:  11/08/2017  Time:  1:15 PM Type of Therapy:  Nurse Education  Participation Level:  Active  Participation Quality:  Appropriate and Attentive  Affect:  Appropriate  Cognitive:  Alert and Appropriate  Insight:  Appropriate and Good  Engagement in Group:  Engaged  Modes of Intervention:  Discussion and Education  Summary of Progress/Problems: Patient was appropriately engaged in Life Skills group led by RN Shela NevinValerie S Margot Oriordan 11/08/2017, 4:34 PM

## 2017-11-08 NOTE — Progress Notes (Signed)
Patient has been up in the dayroom watching the game and interacting with peers appropriately. Writer spoke with her 1:1 and she reports that she still has been getting anxious and agitated as the day goes on and that her gabapentin was increased. She reports speaking to her fiance today and is not sure that she is ready for discharge because she knows what she has to face once back home. She reports that she cares for her fiances mother and father along with 10 dogs. She reports that he made the comment that maybe he needs to move on and get a job. Writer informed her to make sure she gets herself well before feeling that she needs to leave in order to care for his parents. Support given and safety maintained on unit with 15 min checks.

## 2017-11-08 NOTE — [facility] Group Notes (Signed)
[facility] LCSW Group Therapy Note  Date/Time:  11/08/2017 9:00-10:00 or 10:00-11:00AM  Type of Therapy and Topic:  Group Therapy:  Healthy and Unhealthy Supports  Participation Level:  Active   Description of Group:  Patients in this group were introduced to the idea of adding a variety of healthy supports to address the various needs in their lives.Patients discussed what additional healthy supports could be helpful in their recovery and wellness after discharge in order to prevent future hospitalizations.   An emphasis was placed on using counselor, doctor, therapy groups, 12-step groups, and problem-specific support groups to expand supports.  They also worked as a group on developing a specific plan for several patients to deal with unhealthy supports through boundary-setting, psychoeducation with loved ones, and even termination of relationships.   Therapeutic Goals:   1)  discuss importance of adding supports to stay well once out of the hospital  2)  compare healthy versus unhealthy supports and identify some examples of each  3)  generate ideas and descriptions of healthy supports that can be added  4)  offer mutual support about how to address unhealthy supports  5)  encourage active participation in and adherence to discharge plan    Summary of Patient Progress:  The patient stated that current healthy supports in her life are professional supports and former friends she has been isolating herself from, plus at times her fiance and at times his father, while current unhealthy supports include at times her fiance and his father, as well as her drug dealer.  The patient expressed a willingness to add frequent Narcotics Anonymous meetings and possibly rehabilitation after this hospitalization as support(s) to help in her recovery journey.  She said she has to have a long talk with her fiance in order to discuss changes that are needed to help her remain sober.  She is willing to leave the  home if needed, or go to rehab, or do whatever it is going to take to change her life.   Therapeutic Modalities:   Motivational Interviewing Brief Solution-Focused Therapy  Ambrose MantleMareida Grossman-Orr, LCSW

## 2017-11-08 NOTE — Plan of Care (Signed)
  Problem: Activity: Goal: Interest or engagement in activities will improve Outcome: Progressing Goal: Sleeping patterns will improve Outcome: Progressing   Problem: Education: Goal: Emotional status will improve Outcome: Progressing   Problem: Coping: Goal: Ability to verbalize frustrations and anger appropriately will improve Outcome: Progressing   Problem: Activity: Goal: Interest or engagement in leisure activities will improve Outcome: Progressing Goal: Imbalance in normal sleep/wake cycle will improve Outcome: Progressing   Problem: Education: Goal: Knowledge of the prescribed therapeutic regimen will improve Outcome: Progressing   Problem: Coping: Goal: Ability to cope will improve Outcome: Progressing Goal: Ability to verbalize feelings will improve Outcome: Progressing   Problem: Self-Concept: Goal: Level of anxiety will decrease Outcome: Progressing  Patient understands the process of her treatment.  She is able to verbalize her feelings and is progressing toward the goals necessary for discharge.

## 2017-11-08 NOTE — Progress Notes (Signed)
Pt attended AA group this evening.  

## 2017-11-08 NOTE — Progress Notes (Signed)
[facility] MD Progress Note  11/08/2017 12:38 PM Amanda Carrillo  MRN:  [id number]  Subjective: "I am anxious, do not feel like the Vistaril and gabapentin are working, may be going up on gabapentin may help, felt panic attach yesterday evening due to conflict with my primary support issues."   Objective:Amanda Carrillo, 42 y.o., female patient  seen face to face by this MD; chart reviewed and discussed with staff RN on 11/08/17.  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 Patient stated that she has been actively participating in therapeutic group activities and also tolerating her medications without adverse reactions.  Patient denies suicidal/self-harm/homicidal ideations, psychosis, and paranoia.   Patient has no intent or plan of suicide even she had suicide ideations. She endorses abusing cocaine and being on probation until end of September and reports she is trying to go to jail due to substance abuse. She will be participating in [facility] treatment and will be closely monitor for drug screening.   Principal Problem: Bipolar 1 disorder, mixed, moderate (HCC)  Diagnosis:   Patient Active Problem List   Diagnosis Date Noted  . Bipolar 1 disorder, mixed, moderate (HCC) [F31.62] 11/05/2017  . MDD (major depressive disorder), severe (HCC) [F32.2] 11/04/2017  . Opioid use disorder, severe, dependence (HCC) [F11.20] 08/07/2017  . Opioid use disorder (HCC) [F11.99] 08/04/2017  . Depression [F32.9]    Total Time spent with patient: 20 minutes  Past Psychiatric History: See H&P  Past Medical History:  Past Medical History:  Diagnosis Date  . ADHD (attention deficit hyperactivity disorder)   . Anxiety   . Depression   . Gastroparesis 01/2017  . Polysubstance abuse (HCC)    herion, methadone, etoh    Past  Surgical History:  Procedure Laterality Date  . CHOLECYSTECTOMY    . TUBAL LIGATION    . TUBAL LIGATION     Family History:  Family History  Problem Relation Age of Onset  . Pneumonia Other   . Depression Mother    Family Psychiatric  History: See H&P  Social History:  Social History   Substance and Sexual Activity  Alcohol Use No     Social History   Substance and Sexual Activity  Drug Use Yes  . Types: IV, Cocaine   Comment: herion (former per pt)    Social History   Socioeconomic History  . Marital status: Divorced    Spouse name: Not on file  . Number of children: Not on file  . Years of education: Not on file  . Highest education level: Not on file  Occupational History  . Not on file  Social Needs  . Financial resource strain: Not on file  . Food insecurity:    Worry: Not on file    Inability: Not on file  . Transportation needs:    Medical: Not on file    Non-medical: Not on file  Tobacco Use  . Smoking status: Current Every Day Smoker    Packs/day: 2.00    Types: Cigarettes  . Smokeless tobacco: Never Used  Substance and Sexual Activity  . Alcohol use: No  . Drug use: Yes    Types: IV, Cocaine    Comment: herion (former per pt)  . Sexual activity: Yes    Birth control/protection: Surgical  Lifestyle  .  Physical activity:    Days per week: Not on file    Minutes per session: Not on file  . Stress: Not on file  Relationships  . Social connections:    Talks on phone: Not on file    Gets together: Not on file    Attends religious service: Not on file    Active member of club or organization: Not on file    Attends meetings of clubs or organizations: Not on file    Relationship status: Not on file  Other Topics Concern  . Not on file  Social History Narrative  . Not on file   Additional Social History:  Pain Medications: denies currently Prescriptions: denies currently Over the Counter: denies currently History of alcohol / drug use?:  Yes Longest period of sobriety (when/how long): Unknown Name of Substance 1: Cocaine 1 - Age of First Use: Early 3920s 1 - Amount (size/oz): $100/day 1 - Frequency: Daily for the last two months 1 - Duration: Unknown 1 - Last Use / Amount: 11/02/17 Name of Substance 2: Marijuana 2 - Age of First Use: 17 2 - Amount (size/oz): 1-2 bowls 2 - Frequency: Every couple of months 2 - Duration: Unknown 2 - Last Use / Amount: 10/07/17  Sleep: Good  Appetite:  Good  Current Medications: Current Facility-Administered Medications  Medication Dose Route Frequency Provider Last Rate Last Dose  . acetaminophen (TYLENOL) tablet 650 mg  650 mg Oral Q6H PRN Rankin, Shuvon B, NP   650 mg at 11/08/17 1012  . alum & mag hydroxide-simeth (MAALOX/MYLANTA) 200-200-20 MG/5ML suspension 30 mL  30 mL Oral Q4H PRN Rankin, Shuvon B, NP      . cephALEXin (KEFLEX) capsule 500 mg  500 mg Oral Q8H Simon, Spencer E, PA-C   500 mg at 11/08/17 [id number]  . feeding supplement (ENSURE ENLIVE) (ENSURE ENLIVE) liquid 237 mL  237 mL Oral Daily PRN Cobos, Fernando A, MD      . FLUoxetine (PROZAC) capsule 20 mg  20 mg Oral Daily Micheal Likensainville, Christopher T, MD   20 mg at 11/08/17 0819  . gabapentin (NEURONTIN) capsule 300 mg  300 mg Oral TID Rankin, Shuvon B, NP   300 mg at 11/08/17 1159  . hydrocortisone cream 1 %   Topical TID PRN Nira ConnBerry, Jason A, NP      . hydrOXYzine (ATARAX/VISTARIL) tablet 25 mg  25 mg Oral Q6H PRN Rankin, Shuvon B, NP   25 mg at 11/08/17 0820  . magnesium hydroxide (MILK OF MAGNESIA) suspension 30 mL  30 mL Oral Daily PRN Rankin, Shuvon B, NP      . multivitamin with minerals tablet 1 tablet  1 tablet Oral Daily Cobos, Rockey SituFernando A, MD   1 tablet at 11/08/17 0819  . nicotine (NICODERM CQ - dosed in mg/24 hours) patch 21 mg  21 mg Transdermal Daily Donell SievertSimon, Spencer E, PA-C   21 mg at 11/08/17 0819  . OLANZapine (ZYPREXA) tablet 10 mg  10 mg Oral QHS Micheal Likensainville, Christopher T, MD   10 mg at 11/07/17 2138  . traZODone  (DESYREL) tablet 150 mg  150 mg Oral QHS Armandina StammerNwoko, Agnes I, NP   150 mg at 11/07/17 2138   Lab Results:  Results for orders placed or performed during the hospital encounter of 11/04/17 (from the past 48 hour(s))  Urinalysis, Routine w reflex microscopic     Status: Abnormal   Collection Time: 11/06/17  5:41 PM  Result Value Ref Range   Color, Urine  YELLOW YELLOW   APPearance CLOUDY (A) CLEAR   Specific Gravity, Urine 1.024 1.005 - 1.030   pH 5.0 5.0 - 8.0   Glucose, UA NEGATIVE NEGATIVE mg/dL   Hgb urine dipstick SMALL (A) NEGATIVE   Bilirubin Urine NEGATIVE NEGATIVE   Ketones, ur NEGATIVE NEGATIVE mg/dL   Protein, ur NEGATIVE NEGATIVE mg/dL   Nitrite NEGATIVE NEGATIVE   Leukocytes, UA LARGE (A) NEGATIVE   RBC / HPF 0-5 0 - 5 RBC/hpf   WBC, UA 6-30 0 - 5 WBC/hpf   Bacteria, UA RARE (A) NONE SEEN   Squamous Epithelial / LPF 6-30 (A) NONE SEEN   Mucus PRESENT     Comment: Performed at [facility], [address] [place], [place] [address]   Blood Alcohol level:  Lab Results  Component Value Date   ETH <10 11/03/2017   ETH 40 (H) 07/08/2015   Metabolic Disorder Labs: No results found for: HGBA1C, MPG No results found for: PROLACTIN No results found for: CHOL, TRIG, HDL, CHOLHDL, VLDL, LDLCALC  Physical Findings: AIMS: Facial and Oral Movements Muscles of Facial Expression: None, normal Lips and Perioral Area: None, normal Jaw: None, normal Tongue: None, normal,Extremity Movements Upper (arms, wrists, hands, fingers): None, normal Lower (legs, knees, ankles, toes): None, normal, Trunk Movements Neck, shoulders, hips: None, normal, Overall Severity Severity of abnormal movements (highest score from questions above): None, normal Incapacitation due to abnormal movements: None, normal Patient's awareness of abnormal movements (rate only patient's report): No Awareness, Dental Status Current problems with teeth and/or dentures?: No Does patient usually wear  dentures?: No  CIWA:  CIWA-Ar Total: 1 COWS:  COWS Total Score: 2  Musculoskeletal: Strength & Muscle Tone: within normal limits Gait & Station: normal Patient leans: N/A  Psychiatric Specialty Exam: Physical Exam  Nursing note and vitals reviewed. Constitutional: She appears well-developed and well-nourished.  HENT:  Head: Normocephalic.  Neck: Normal range of motion. Neck supple.  Respiratory: Effort normal.  Skin: Skin is warm and dry. There is erythema.  Left inner bicep written, warm to touch, and slightly tender with palpation.  Area marked with black pin patient and RN informed to let MD. know if the redness goes outside the area marked or if condition worsens.    Review of Systems  Psychiatric/Behavioral: Positive for depression ("Improving") and substance abuse (Hx. Cocaine use disorder). Negative for hallucinations, memory loss and suicidal ideas. The patient is nervous/anxious. The patient does not have insomnia ("Improved").   All other systems reviewed and are negative.   Blood pressure 108/90, pulse (!) 113, temperature 98.5 F (36.9 C), resp. rate 16, height 5\' 7"  (1.702 m), weight 82.1 kg (181 lb).Body mass index is 28.35 kg/m.  General Appearance: Casual and Fairly Groomed  Eye Contact:  Good  Speech:  Clear and Coherent and Normal Rate  Volume:  Normal  Mood:  Anxious and Depressed  Affect:  Appropriate, Congruent and Labile  Thought Process:  Coherent and Goal Directed  Orientation:  Full (Time, Place, and Person)  Thought Content:  Logical  Suicidal Thoughts:  Yes.  without intent/plan  Homicidal Thoughts:  No  Memory:  Immediate;    good Recent;    good Remote;    good  Judgement:   Fair   Insight:   fair  Psychomotor Activity:  Normal  Concentration:  Concentration: Fair  Recall:   Good  Fund of Knowledge:  Good  Language:   Good  Akathisia:  No  Handed:  AIMS (if indicated):     Assets:  Resilience, communication, goal-directed  ADL's:   Intact  Cognition:  WNL  Sleep:  Number of Hours: 6.75     Treatment Plan Summary: Daily contact with patient to assess and evaluate symptoms and progress in treatment.   Continue inpatient hospitalization.  Will continue today 11/08/2017 plan as below except where it is noted.  Depression.      - Continue Prozac 20 mg po daily for depression.  Anxiety.     - Continue Vistaril 25 mg po qid prn.     - Increase Gabapentin 400 mg Tid  Mood control.     - Continue Olanzapine 10 mg po Q hs.  Insomnia.      - Continue Trazodone 150 mg po Q hs.  Urinary Tract infection.      - Continue Keflex capsul 500 mg po tid.       - Patient will continue to participate in the group counseling sessions.      - Discharge disposition on going.  Leata Mouse, MD 11/08/2017, 12:38 PM

## 2017-11-08 NOTE — Progress Notes (Signed)
D: Patient brightens upon approach.  She is pleasant with staff; she is sleeping and eating well.  Her energy level is normal and her concentration is good.  Patient denies any thoughts of self harm.  She continues to have some itching from a previous flu shot vaccine, however, it has improved.  She denies any physical pain, except for a mild headache which she was medicated with tylenol.  Her goal today is to work "on my anger management."  She complains of minimal withdrawal symptoms; she checked agitation and irritability on her self inventory.  She is attending groups and appears engaged.  A: Continue to monitor medication management and MD orders.  Safety checks completed every 15 minutes per protocol.  Offer support and encouragement as needed.  R: Patient is receptive to staff; her behavior is appropriate.

## 2017-11-09 MED ORDER — GABAPENTIN 400 MG PO CAPS: 400.0000 mg | ORAL_CAPSULE | Freq: Three times a day (TID) | ORAL | 0 refills | Status: DC

## 2017-11-09 MED ORDER — HYDROXYZINE HCL 25 MG PO TABS: 25.0000 mg | ORAL_TABLET | Freq: Four times a day (QID) | ORAL | 0 refills | Status: DC | PRN

## 2017-11-09 MED ORDER — FLUOXETINE HCL 20 MG PO CAPS: 20.0000 mg | ORAL_CAPSULE | Freq: Every day | ORAL | 0 refills | Status: DC

## 2017-11-09 MED ORDER — FLUTICASONE PROPIONATE 50 MCG/ACT NA SUSP: 1.0000 | Freq: Every day | NASAL | 0 refills | Status: DC

## 2017-11-09 MED ORDER — OLANZAPINE 10 MG PO TABS: 10.0000 mg | ORAL_TABLET | Freq: Every day | ORAL | 0 refills | Status: DC

## 2017-11-09 MED ORDER — CEPHALEXIN 500 MG PO CAPS: 500.0000 mg | ORAL_CAPSULE | Freq: Three times a day (TID) | ORAL | 0 refills | Status: DC

## 2017-11-09 MED ORDER — HYDROCORTISONE 1 % EX CREA: TOPICAL_CREAM | Freq: Three times a day (TID) | CUTANEOUS | 0 refills | Status: DC | PRN

## 2017-11-09 MED ORDER — TRAZODONE HCL 150 MG PO TABS: 150.0000 mg | ORAL_TABLET | Freq: Every day | ORAL | 0 refills | Status: DC

## 2017-11-09 MED ORDER — NICOTINE 21 MG/24HR TD PT24: 21.0000 mg | MEDICATED_PATCH | Freq: Every day | TRANSDERMAL | 0 refills | Status: DC

## 2017-11-09 NOTE — [facility] Suicide Risk Assessment (Signed)
[facility] Discharge Suicide Risk Assessment   Principal Problem: Bipolar 1 disorder, mixed, moderate (HCC) Discharge Diagnoses:  Patient Active Problem List   Diagnosis Date Noted  . Bipolar 1 disorder, mixed, moderate (HCC) [F31.62] 11/05/2017  . MDD (major depressive disorder), severe (HCC) [F32.2] 11/04/2017  . Opioid use disorder, severe, dependence (HCC) [F11.20] 08/07/2017  . Opioid use disorder (HCC) [F11.99] 08/04/2017  . Depression [F32.9]     Total Time spent with patient: 30 minutes  Musculoskeletal: Strength & Muscle Tone: within normal limits Gait & Station: normal Patient leans: N/A  Psychiatric Specialty Exam: Review of Systems  Constitutional: Negative for chills and fever.  Respiratory: Negative for cough and shortness of breath.   Cardiovascular: Negative for chest pain.  Gastrointestinal: Negative for abdominal pain, heartburn, nausea and vomiting.  Psychiatric/Behavioral: Negative for depression, hallucinations and suicidal ideas. The patient is not nervous/anxious.     Blood pressure 115/88, pulse 88, temperature 98.4 F (36.9 C), temperature source Oral, resp. rate 16, height 5\' 7"  (1.702 m), weight 82.1 kg (181 lb).Body mass index is 28.35 kg/m.  General Appearance: Casual and Fairly Groomed  Patent attorney::  Good  Speech:  Clear and Coherent and Normal Rate  Volume:  Normal  Mood:  Euthymic  Affect:  Appropriate and Congruent  Thought Process:  Coherent and Goal Directed  Orientation:  Full (Time, Place, and Person)  Thought Content:  Logical  Suicidal Thoughts:  No  Homicidal Thoughts:  No  Memory:  Immediate;   Fair Recent;   Fair Remote;   Fair  Judgement:  Fair  Insight:  Fair  Psychomotor Activity:  Normal  Concentration:  Fair  Recall:  [employer] of Knowledge:Fair  Language: Fair  Akathisia:  No  Handed:    AIMS (if indicated):     Assets:  Communication Skills Resilience Social Support  Sleep:  Number of Hours: 6.75  Cognition: WNL   ADL's:  Intact   Mental Status Per Nursing Assessment::   On Admission:     Demographic Factors:  Caucasian  Loss Factors: Legal issues and Financial problems/change in socioeconomic status  Historical Factors: Family history of mental illness or substance abuse and Impulsivity  Risk Reduction Factors:   Positive social support, Positive therapeutic relationship and Positive coping skills or problem solving skills  Continued Clinical Symptoms:  Bipolar Disorder:   Mixed State Alcohol/Substance Abuse/Dependencies  Cognitive Features That Contribute To Risk:  None    Suicide Risk:  Minimal: No identifiable suicidal ideation.  Patients presenting with no risk factors but with morbid ruminations; may be classified as minimal risk based on the severity of the depressive symptoms  Follow-up Information    [facility] Follow up on 11/11/2017.   Why:  Hospital follow-up on Wed, 11/11/17 at 9:00AM. Please bring: photo ID, social security card, any proof of household income, and hospital discharge paperwork. Thank you.  Contact information: [address] [place] [address] [phone]         Subjective Data:  Amanda Carrillo is a 42 y/o F with history of treatment for depression and bipolar disorder and cocaine use who was admitted to [name] voluntarily from [name] after she presented with symptoms of worsening depression, SI with plan to overdose, and worsening use of cocaine. Pt had reported that she had been off of her previous psychotropic medications for the past 6 months and had been experiencing gradually worsening symptoms of depression, anxiety, and mood swings during that time. Pt was restarted  on combination of prozac and olanzapine, and she reported gradual improvement of her presenting symptoms.  Today upon evaluation, pt shares, "I feel better; the medications have been helping." Pt reports she is sleeping well and she is no longer drowsy in the AM as she  had been on higher dose of trazodone which was lowered during her stay. She denies SI/HI/AH/VH. She denies any physical complaints. Her appetite is good. She is tolerating her medications without difficulty or side effects. She is in agreement to continue her current treatment regimen without changes. She plans to follow up with her parole officer and her previous substance use treatment programs [facility]. She was able to engage in safety planning [facility] or contact emergency services if she feels unable to maintain her own safety or the safety of others. Pt had no further questions, comments, or concerns.   Plan Of Care/Follow-up recommendations:   -Discharge to outpatient level of care  -Bipolar I, current episode mixed      - Continue Prozac 20mg  po daily for depression.   -Continue olanzapine 10mg  po qhs  -Anxiety.     - Continue Vistaril 25mg  po qid prn.     - Continue  Gabapentin 400mg  po TID  -Insomnia.      - Continue Trazodone 150 mg po Q hs.  -Urinary Tract infection.      - Continue Keflex capsule 500 mg po tid.  Activity:  as tolerated Diet:  normal Tests:  NA Other:  see above for DC plan  Amanda Likenshristopher T Jonae Renshaw, MD 11/09/2017, 9:44 AM

## 2017-11-09 NOTE — Progress Notes (Signed)
Adult Psychoeducational Group Note  Date:  11/09/2017 Time:  8:50 AM  Group Topic/Focus:  Goals Group:   The focus of this group is to help patients establish daily goals to achieve during treatment and discuss how the patient can incorporate goal setting into their daily lives to aide in recovery.  Participation Level:  Active  Participation Quality:  Appropriate  Affect:  Appropriate  Cognitive:  Appropriate  Insight: Appropriate  Engagement in Group:  Engaged  Modes of Intervention:  Discussion  Additional Comments:  Pt sat as the group talked about the schedule and about the staff that would be working with her today.  Pt discussed what her favorite groups are and she gave words of encouragement to her peers.  [name] 11/09/2017, 8:50 AM

## 2017-11-09 NOTE — Progress Notes (Signed)
D:  Patient's self inventory sheet, patient sleeps good, sleep medication helpful.  Good appetite, high energy level, good concentration.  Rated depression 8, denied hopeless, rated anxiety 7.  Denied withdrawals.  Denied SI.  Denied physical problems.  Denied physical pain.  Goal is discharge.  Plans to talk to SW/MD.  "You guys are so great and I love all the help."  Does have discharge plans. A:  Medications administered per MD orders.  Emotional support and encouragement given patient. R:  Denied SI and HI, contracts for safety.  Denied A/V hallucinations.  Safety maintained with 15 minute checks.

## 2017-11-09 NOTE — Progress Notes (Signed)
Recreation Therapy Notes  Date: 3.25.19 Time: 9:30 a.m. Location: [address] Day[address]   Group Topic: Stress Management   Goal Area(s) Addresses:  Goal 1.1: To reduce stress  -Patient will report feeling a reduction in stress level  -Patient will identify the importance of stress management  -Patient will participate during stress management group treatment     Behavioral Response: Engaged   Intervention: Stress Management   Activity: Meditattion- Patients were in a peaceful environment with soft lighting enhancing patients mood. Patients listened to a deep concentration voice over to decrease stress levels   Education: Stress Management, Discharge Planning.    Education Outcome: Acknowledges edcuation/In group clarification offered/Needs additional education   Clinical Observations/Feedback:: Patient attended and participated appropriately during stress management group treatment. Patient reported feeling a reduction in stress level.    Reiley Bertagnolli, Recreation Therapy Intern   Vertie Dibbern 11/09/2017 8:29 AM 

## 2017-11-09 NOTE — Progress Notes (Signed)
Discharge Note:  Patient discharged home with family member.  Patient denied SI and HI.  Denied A/V hallucinations.  Suicide prevention information given and discussed with patient who stated she understood and had no questions.  Patient stated she received all her belongings, clothing, toiletries, misc items, prescriptions, medications, etc.  Patient stated she appreciated all assistance received from [facility] staff.  All required discharge information given to patient at discharge.  

## 2017-11-09 NOTE — Progress Notes (Signed)
  [facility] :  Will you be returning to the same living situation after discharge:  Yes,  patient reports she will return home with her fiance At discharge, do you have transportation home?: Yes,  patient reports her fiance will pick her up  Do you have the ability to pay for your medications: No.  Release of information consent forms completed and in the chart;  Patient's signature needed at discharge.  Patient to Follow up at: Follow-up Information    [facility] Follow up on 11/11/2017.   Why:  Hospital follow-up on Wed, 11/11/17 at 9:00AM. Please bring: photo ID, social security card, any proof of household income, and hospital discharge paperwork. Thank you.  Contact information: [address] [place] [id number] [phone]           Next level of care provider has access to [facility] Link:yes  Safety Planning and Suicide Prevention discussed: Yes,  with the patient's fiance, Geanie BerlinJeremiah Patterson [phone])  Have you used any form of tobacco in the last 30 days? (Cigarettes, Smokeless Tobacco, Cigars, and/or Pipes): Yes  Has patient been referred to the Quitline?: Patient refused referral  Patient has been referred for addiction treatment: Pt. refused referral  Maeola SarahJolan E Jimmie Rueter, LCSWA 11/09/2017, 12:17 PM

## 2017-11-09 NOTE — Discharge Summary (Addendum)
Physician Discharge Summary Note  Patient:  Amanda Carrillo is an 42 y.o., female  MRN:  [id number]  DOB:  [birth year]  Patient phone:  [phone] (home)   Patient address:   [place] [address],  Total Time spent with patient: Greater than 30 minutes  Date of Admission:  11/04/2017 Date of Discharge: 11-09-17  Reason for Admission: Worsening depression, SI with plan to overdose & worsening use of cocaine.  Principal Problem: Bipolar 1 disorder, mixed, moderate (HCC)  Discharge Diagnoses: Patient Active Problem List   Diagnosis Date Noted  . Bipolar 1 disorder, mixed, moderate (HCC) [F31.62] 11/05/2017  . MDD (major depressive disorder), severe (HCC) [F32.2] 11/04/2017  . Opioid use disorder, severe, dependence (HCC) [F11.20] 08/07/2017  . Opioid use disorder (HCC) [F11.99] 08/04/2017  . Depression [F32.9]    Past Psychiatric History: Bipolar disorder, Opioid use disorder.  Past Medical History:  Past Medical History:  Diagnosis Date  . ADHD (attention deficit hyperactivity disorder)   . Anxiety   . Depression   . Gastroparesis 01/2017  . Polysubstance abuse (HCC)    herion, methadone, etoh    Past Surgical History:  Procedure Laterality Date  . CHOLECYSTECTOMY    . TUBAL LIGATION    . TUBAL LIGATION     Family History:  Family History  Problem Relation Age of Onset  . Pneumonia Other   . Depression Mother    Family Psychiatric  History: See H&P.  Social History:  Social History   Substance and Sexual Activity  Alcohol Use No     Social History   Substance and Sexual Activity  Drug Use Yes  . Types: IV, Cocaine   Comment: herion (former per pt)    Social History   Socioeconomic History  . Marital status: Divorced    Spouse name: Not on file  . Number of children: Not on file  . Years of education: Not on file  . Highest education level: Not on file  Occupational History  . Not on file  Social Needs  . Financial resource  strain: Not on file  . Food insecurity:    Worry: Not on file    Inability: Not on file  . Transportation needs:    Medical: Not on file    Non-medical: Not on file  Tobacco Use  . Smoking status: Current Every Day Smoker    Packs/day: 2.00    Types: Cigarettes  . Smokeless tobacco: Never Used  Substance and Sexual Activity  . Alcohol use: No  . Drug use: Yes    Types: IV, Cocaine    Comment: herion (former per pt)  . Sexual activity: Yes    Birth control/protection: Surgical  Lifestyle  . Physical activity:    Days per week: Not on file    Minutes per session: Not on file  . Stress: Not on file  Relationships  . Social connections:    Talks on phone: Not on file    Gets together: Not on file    Attends religious service: Not on file    Active member of club or organization: Not on file    Attends meetings of clubs or organizations: Not on file    Relationship status: Not on file  Other Topics Concern  . Not on file  Social History Narrative  . Not on file   Hospital Course: (Per Md's SRA):  Amanda Carrillo is a 42 y/o F with history of treatment for depression and bipolar disorder  and cocaine use who was admitted to [facility] voluntarily from [facility] ED after she presented with symptoms of worsening depression, SI with plan to overdose, and worsening use of cocaine. Pt had reported that she had been off of her previous psychotropic medications for the past 6 months and had been experiencing gradually worsening symptoms of depression, anxiety, and mood swings during that time. Pt was restarted on combination of prozac and olanzapine, and she reported gradual improvement of her presenting symptoms.  Besides the combination of Olanzapine 10 mg & Prozac 20 mg for mood control/depression, Amanda Carrillo was also medicated & discharged on; Gabapentin 400 mg for agitation, Hydroxyzine 25 mg prn for anxiety & Trazodone 150 mg for insomnia. She presented other significant health issues that required  treatment & monitoring. She received treatment for those medical issues. She tolerated her treatment regimen without any adverse effects or reactions reported. Amanda Carrillo was enrolled in & participated in the group counseling sessions being offered & held on this unit. She learned coping skills.   Today upon her discharge evaluation with the attending psychiatrist, pt shares, "I feel better; the medications have been helping." Pt reports she is sleeping well and she is no longer drowsy in the AM as she had been on higher dose of trazodone which was lowered during her stay. She denies SI/HI/AH/VH. She denies any physical complaints. Her appetite is good. She is tolerating her medications without difficulty or side effects. She is in agreement to continue her current treatment regimen without changes. She plans to follow up with her parole officer and her previous substance use treatment programs [facility]. She was able to engage in safety planning including plan to return to [facility] or contact emergency services if she feels unable to maintain her own safety or the safety of others. Pt had no further questions, comments, or concerns.  Upon discharge, Amanda Carrillo presents mentally & medically stable.She received from the [facility] pharmacy, a 7 days worth supply samples of her [facility] discharge medications. She left [facility] with all personal belongings in no apparent distress. Transportation per her arrangement.  Physical Findings: AIMS: Facial and Oral Movements Muscles of Facial Expression: None, normal Lips and Perioral Area: None, normal Jaw: None, normal Tongue: None, normal,Extremity Movements Upper (arms, wrists, hands, fingers): None, normal Lower (legs, knees, ankles, toes): None, normal, Trunk Movements Neck, shoulders, hips: None, normal, Overall Severity Severity of abnormal movements (highest score from questions above): None, normal Incapacitation due to abnormal movements: None, normal Patient's  awareness of abnormal movements (rate only patient's report): No Awareness, Dental Status Current problems with teeth and/or dentures?: No Does patient usually wear dentures?: No  CIWA:  CIWA-Ar Total: 1 COWS:  COWS Total Score: 2  Musculoskeletal: Strength & Muscle Tone: within normal limits Gait & Station: normal Patient leans: N/A  Psychiatric Specialty Exam: Physical Exam  Constitutional: She appears well-developed.  HENT:  Head: Normocephalic.  Eyes: Pupils are equal, round, and reactive to light.  Neck: Normal range of motion.  Cardiovascular: Normal rate.  Respiratory: Effort normal.  GI: Soft.  Genitourinary:  Genitourinary Comments: Deferred  Musculoskeletal: Normal range of motion.  Neurological: She is alert.  Skin: Skin is warm.    Review of Systems  Constitutional: Negative.   HENT: Negative.   Eyes: Negative.   Respiratory: Negative.   Cardiovascular: Negative.   Gastrointestinal: Negative.   Musculoskeletal: Negative.   Skin: Negative.   Neurological: Negative.   Endo/Heme/Allergies: Negative.   Psychiatric/Behavioral: Positive for depression (Stabilized  with medication prior to discharge) and substance abuse (Hx. Hx. Cocaine use disorder). Negative for hallucinations, memory loss and suicidal ideas. The patient has insomnia (Stabilized with medication prior to discharge). The patient is not nervous/anxious (Stabilized with medication prior to dicharge).     Blood pressure 115/88, pulse 88, temperature 98.4 F (36.9 C), temperature source Oral, resp. rate 16, height 5\' 7"  (1.702 m), weight 82.1 kg (181 lb).Body mass index is 28.35 kg/m.  See Md's SRA   Have you used any form of tobacco in the last 30 days? (Cigarettes, Smokeless Tobacco, Cigars, and/or Pipes): Yes  Has this patient used any form of tobacco in the last 30 days? (Cigarettes, Smokeless Tobacco, Cigars, and/or Pipes): N/A  Blood Alcohol level:  Lab Results  Component Value Date   ETH <10  11/03/2017   ETH 40 (H) 07/08/2015   Metabolic Disorder Labs:  No results found for: HGBA1C, MPG No results found for: PROLACTIN No results found for: CHOL, TRIG, HDL, CHOLHDL, VLDL, LDLCALC  See Psychiatric Specialty Exam and Suicide Risk Assessment completed by Attending Physician prior to discharge.  Discharge destination:  Home  Is patient on multiple antipsychotic therapies at discharge:  No   Has Patient had three or more failed trials of antipsychotic monotherapy by history:  No  Recommended Plan for Multiple Antipsychotic Therapies: NA  Allergies as of 11/09/2017      Reactions   Toradol [ketorolac Tromethamine] Hives, Nausea And Vomiting      Medication List    STOP taking these medications   Buprenorphine HCl-Naloxone HCl 8-2 MG Film Commonly known as:  SUBOXONE   metoCLOPramide 10 MG tablet Commonly known as:  REGLAN   ondansetron 4 MG tablet Commonly known as:  ZOFRAN     TAKE these medications     Indication  cephALEXin 500 MG capsule Commonly known as:  KEFLEX Take 1 capsule (500 mg total) by mouth every 8 (eight) hours. For urinary tract infection  Indication:  UTI   FLUoxetine 20 MG capsule Commonly known as:  PROZAC Take 1 capsule (20 mg total) by mouth daily. For depression Start taking on:  11/10/2017  Indication:  Depression   fluticasone 50 MCG/ACT nasal spray Commonly known as:  FLONASE Place 1 spray into both nostrils daily. For allergies Start taking on:  11/10/2017  Indication:  Allergic Rhinitis   gabapentin 400 MG capsule Commonly known as:  NEURONTIN Take 1 capsule (400 mg total) by mouth 3 (three) times daily. For agitation  Indication:  Agitation   hydrocortisone cream 1 % Apply topically 3 (three) times daily as needed for itching.  Indication:  Itching   hydrOXYzine 25 MG tablet Commonly known as:  ATARAX/VISTARIL Take 1 tablet (25 mg total) by mouth every 6 (six) hours as needed for anxiety (Sleep).  Indication:  Feeling  Anxious, itching   nicotine 21 mg/24hr patch Commonly known as:  NICODERM CQ - dosed in mg/24 hours Place 1 patch (21 mg total) onto the skin daily. (May purchase from over the counter at the pharmacy): For smoking cessation Start taking on:  11/10/2017  Indication:  Nicotine Addiction   OLANZapine 10 MG tablet Commonly known as:  ZYPREXA Take 1 tablet (10 mg total) by mouth at bedtime. For mood control  Indication:  Mood control   traZODone 150 MG tablet Commonly known as:  DESYREL Take 1 tablet (150 mg total) by mouth at bedtime. For sleep  Indication:  Trouble Sleeping      Follow-up Information  [facility], [facility] Follow up on 11/11/2017.   Why:  Hospital follow-up on Wed, 11/11/17 at 9:00AM. Please bring: photo ID, social security card, any proof of household income, and hospital discharge paperwork. Thank you.  Contact information: [address] [place] [id number] [phone]          Follow-up recommendations: Activity:  As tolerated Diet: As recommended by your primary care doctor. Keep all scheduled follow-up appointments as recommended.   Comments: Patient is instructed prior to discharge to: Take all medications as prescribed by his/her mental healthcare provider. Report any adverse effects and or reactions from the medicines to his/her outpatient provider promptly. Patient has been instructed & cautioned: To not engage in alcohol and or illegal drug use while on prescription medicines. In the event of worsening symptoms, patient is instructed to call the crisis hotline, 911 and or go to the nearest ED for appropriate evaluation and treatment of symptoms. To follow-up with his/her primary care provider for your other medical issues, concerns and or health care needs.   Signed: Armandina StammerAgnes Nwoko, NP, PMHNP, FNP-BC 11/09/2017, 9:46 AM   Patient seen, Suicide Assessment Completed.  Disposition Plan Reviewed

## 2017-11-18 ENCOUNTER — Other Ambulatory Visit: Payer: Self-pay

## 2017-11-18 ENCOUNTER — Encounter (HOSPITAL_COMMUNITY): Payer: Self-pay | Admitting: *Deleted

## 2017-11-18 ENCOUNTER — Inpatient Hospital Stay (HOSPITAL_COMMUNITY)
Admission: RE | Admit: 2017-11-18 | Discharge: 2017-11-26 | DRG: 885 | Disposition: A | Discharge status: Home / Self Care | Payer: No Typology Code available for payment source | Attending: Psychiatry | Admitting: Psychiatry

## 2017-11-18 DIAGNOSIS — Z79899 Other long term (current) drug therapy: Secondary | ICD-10-CM | POA: Diagnosis not present

## 2017-11-18 DIAGNOSIS — G4700 Insomnia, unspecified: Secondary | ICD-10-CM | POA: Diagnosis present

## 2017-11-18 DIAGNOSIS — R45851 Suicidal ideations: Secondary | ICD-10-CM | POA: Diagnosis present

## 2017-11-18 DIAGNOSIS — R451 Restlessness and agitation: Secondary | ICD-10-CM | POA: Diagnosis not present

## 2017-11-18 DIAGNOSIS — Z915 Personal history of self-harm: Secondary | ICD-10-CM

## 2017-11-18 DIAGNOSIS — F419 Anxiety disorder, unspecified: Secondary | ICD-10-CM | POA: Diagnosis present

## 2017-11-18 DIAGNOSIS — X838XXA Intentional self-harm by other specified means, initial encounter: Secondary | ICD-10-CM | POA: Diagnosis not present

## 2017-11-18 DIAGNOSIS — Z886 Allergy status to analgesic agent status: Secondary | ICD-10-CM | POA: Diagnosis not present

## 2017-11-18 DIAGNOSIS — F3162 Bipolar disorder, current episode mixed, moderate: Secondary | ICD-10-CM | POA: Diagnosis present

## 2017-11-18 DIAGNOSIS — F1420 Cocaine dependence, uncomplicated: Secondary | ICD-10-CM | POA: Diagnosis present

## 2017-11-18 DIAGNOSIS — F909 Attention-deficit hyperactivity disorder, unspecified type: Secondary | ICD-10-CM | POA: Diagnosis present

## 2017-11-18 DIAGNOSIS — F17210 Nicotine dependence, cigarettes, uncomplicated: Secondary | ICD-10-CM | POA: Diagnosis not present

## 2017-11-18 DIAGNOSIS — Z653 Problems related to other legal circumstances: Secondary | ICD-10-CM | POA: Diagnosis not present

## 2017-11-18 DIAGNOSIS — T1491XA Suicide attempt, initial encounter: Secondary | ICD-10-CM | POA: Diagnosis not present

## 2017-11-18 DIAGNOSIS — Z818 Family history of other mental and behavioral disorders: Secondary | ICD-10-CM | POA: Diagnosis not present

## 2017-11-18 MED ORDER — MAGNESIUM HYDROXIDE 400 MG/5ML PO SUSP: 30.0000 mL | Freq: Every day | ORAL | Status: DC | PRN

## 2017-11-18 MED ORDER — TRAZODONE HCL 150 MG PO TABS
150.0000 mg | ORAL_TABLET | Freq: Every day | ORAL | Status: DC
  Administered 2017-11-18 – 2017-11-25 (×8): 150 mg via ORAL
  Filled 2017-11-18: qty 1
  Filled 2017-11-18: qty 21
  Filled 2017-11-18 (×7): qty 1
  Filled 2017-11-18: qty 21
  Filled 2017-11-18: qty 1
  Filled 2017-11-18: qty 21

## 2017-11-18 MED ORDER — GABAPENTIN 400 MG PO CAPS
400.0000 mg | ORAL_CAPSULE | Freq: Three times a day (TID) | ORAL | Status: DC
  Administered 2017-11-18 – 2017-11-23 (×15): 400 mg via ORAL
  Filled 2017-11-18 (×22): qty 1

## 2017-11-18 MED ORDER — FLUOXETINE HCL 20 MG PO CAPS
40.0000 mg | ORAL_CAPSULE | Freq: Every day | ORAL | Status: DC
  Administered 2017-11-18 – 2017-11-19 (×2): 40 mg via ORAL
  Filled 2017-11-18 (×5): qty 2

## 2017-11-18 MED ORDER — NICOTINE 21 MG/24HR TD PT24
21.0000 mg | MEDICATED_PATCH | Freq: Every day | TRANSDERMAL | Status: DC
  Administered 2017-11-18 – 2017-11-21 (×4): 21 mg via TRANSDERMAL
  Filled 2017-11-18 (×5): qty 1

## 2017-11-18 MED ORDER — ACETAMINOPHEN 325 MG PO TABS
650.0000 mg | ORAL_TABLET | Freq: Four times a day (QID) | ORAL | Status: DC | PRN
  Administered 2017-11-18 – 2017-11-19 (×2): 650 mg via ORAL
  Filled 2017-11-18 (×2): qty 2

## 2017-11-18 MED ORDER — OLANZAPINE 10 MG PO TABS
10.0000 mg | ORAL_TABLET | Freq: Every day | ORAL | Status: DC
  Administered 2017-11-18 – 2017-11-25 (×8): 10 mg via ORAL
  Filled 2017-11-18: qty 1
  Filled 2017-11-18 (×2): qty 21
  Filled 2017-11-18 (×5): qty 1
  Filled 2017-11-18: qty 21
  Filled 2017-11-18 (×3): qty 1

## 2017-11-18 MED ORDER — HYDROXYZINE HCL 25 MG PO TABS
25.0000 mg | ORAL_TABLET | Freq: Three times a day (TID) | ORAL | Status: DC | PRN
  Administered 2017-11-18: 25 mg via ORAL
  Filled 2017-11-18 (×2): qty 1

## 2017-11-18 MED ORDER — ALUM & MAG HYDROXIDE-SIMETH 200-200-20 MG/5ML PO SUSP: 30.0000 mL | ORAL | Status: DC | PRN
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
## 2017-11-18 NOTE — Progress Notes (Signed)
Pt is a new admit to the unit just before shift change.  Pt reports she was [facility] and now feels her meds are not working.  She says that she has been very depressed and having suicidal thoughts.  At the time of conversation, pt had just gotten out of the shower, saying that she felt more relaxed just by being back in the hospital.  Pt contracts for safety.  She denies HI/AVH.  She is pleasant and cooperative.  She attended the evening NA group.  She makes her needs known to staff.  Pt voiced no needs or concerns.  Support and encouragement offered.  Discharge plans are in process.  Safety maintained with q15 minute checks.

## 2017-11-18 NOTE — Tx Team (Signed)
Initial Treatment Plan 11/18/2017 6:45 PM Olena Matermie L Plummer NWG:[id number]    PATIENT STRESSORS: Financial difficulties Medication change or noncompliance Occupational concerns Substance abuse   PATIENT STRENGTHS: Ability for insight Average or above average intelligence Communication skills General fund of knowledge Motivation for treatment/growth Supportive family/friends   PATIENT IDENTIFIED PROBLEMS: "suicidal thoughts"  "substance abuse"  "depression"  "anxiety"  "hopeless"             DISCHARGE CRITERIA:  Ability to meet basic life and health needs Improved stabilization in mood, thinking, and/or behavior Motivation to continue treatment in a less acute level of care Need for constant or close observation no longer present Reduction of life-threatening or endangering symptoms to within safe limits Safe-care adequate arrangements made Verbal commitment to aftercare and medication compliance Withdrawal symptoms are absent or subacute and managed without 24-hour nursing intervention  PRELIMINARY DISCHARGE PLAN: Attend aftercare/continuing care group Attend PHP/IOP Attend 12-step recovery group Outpatient therapy Return to previous living arrangement  PATIENT/FAMILY INVOLVEMENT: This treatment plan has been presented to and reviewed with the patient, Olena MaterAmie L [place]..  The patient and family have been given the opportunity to ask questions and make suggestions.  Quintella ReichertKnight, Toniann Dickerson WestonShephard, RN 11/18/2017, 6:45 PM

## 2017-11-18 NOTE — Progress Notes (Signed)
Adult Psychoeducational Group Note  Date:  11/18/2017 Time:  4:00 PM  Group Topic/Focus:  Healthy Communication:   The focus of this group is to discuss communication, barriers to communication, as well as healthy ways to communicate with others.  Participation Level:  Active  Participation Quality:  Appropriate  Affect:  Appropriate  Cognitive:  Alert and Oriented   Insight: Appropriate  Engagement in Group:  Developing/Improving  Modes of Intervention:  Discussion  Additional Comments:  Patient provided opportunity to discuss questions or concerns with staff. Patient was encouraged to talk about discharge planning and be supportive of peers.   Amanda Carrillo A Amanda Carrillo 11/18/2017, 7:30 PM 

## 2017-11-18 NOTE — Plan of Care (Signed)
Nurse discussed depression, anxiety, coping skills with patient.  
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
## 2017-11-18 NOTE — Progress Notes (Signed)
Patient stated she is not SI at this time, contracts for safety.  Safety maintained with 15 minute checks.  Respirations even and unlabored.  No signs/symptoms of pain/distress noted on patient's face/body movements.

## 2017-11-19 DIAGNOSIS — R45851 Suicidal ideations: Secondary | ICD-10-CM

## 2017-11-19 DIAGNOSIS — G4700 Insomnia, unspecified: Secondary | ICD-10-CM

## 2017-11-19 DIAGNOSIS — F3162 Bipolar disorder, current episode mixed, moderate: Principal | ICD-10-CM

## 2017-11-19 DIAGNOSIS — Z818 Family history of other mental and behavioral disorders: Secondary | ICD-10-CM

## 2017-11-19 DIAGNOSIS — F17210 Nicotine dependence, cigarettes, uncomplicated: Secondary | ICD-10-CM

## 2017-11-19 DIAGNOSIS — R450 Nervousness: Secondary | ICD-10-CM

## 2017-11-19 DIAGNOSIS — F419 Anxiety disorder, unspecified: Secondary | ICD-10-CM

## 2017-11-19 DIAGNOSIS — F1420 Cocaine dependence, uncomplicated: Secondary | ICD-10-CM

## 2017-11-19 LAB — URINALYSIS, COMPLETE (UACMP) WITH MICROSCOPIC
BILIRUBIN URINE: NEGATIVE
GLUCOSE, UA: NEGATIVE mg/dL
HGB URINE DIPSTICK: NEGATIVE
Ketones, ur: NEGATIVE mg/dL
Leukocytes, UA: NEGATIVE
NITRITE: NEGATIVE
PH: 6 (ref 5.0–8.0)
Protein, ur: NEGATIVE mg/dL
SPECIFIC GRAVITY, URINE: 1.016 (ref 1.005–1.030)

## 2017-11-19 LAB — COMPREHENSIVE METABOLIC PANEL
ALT: 23 U/L (ref 14–54)
ANION GAP: 10 (ref 5–15)
AST: 16 U/L (ref 15–41)
Albumin: 4.2 g/dL (ref 3.5–5.0)
Alkaline Phosphatase: 68 U/L (ref 38–126)
BUN: 17 mg/dL (ref 6–20)
CHLORIDE: 105 mmol/L (ref 101–111)
CO2: 25 mmol/L (ref 22–32)
CREATININE: 0.78 mg/dL (ref 0.44–1.00)
Calcium: 9.6 mg/dL (ref 8.9–10.3)
GFR calc non Af Amer: >60 mL/min (ref >60)
Glucose, Bld: 96 mg/dL (ref 65–99)
Potassium: 4.1 mmol/L (ref 3.5–5.1)
SODIUM: 140 mmol/L (ref 135–145)
Total Bilirubin: 0.9 mg/dL (ref 0.3–1.2)
Total Protein: 7.9 g/dL (ref 6.5–8.1)

## 2017-11-19 LAB — CBC
HCT: 44.8 % (ref 36.0–46.0)
Hemoglobin: 14.4 g/dL (ref 12.0–15.0)
MCH: 31.2 pg (ref 26.0–34.0)
MCHC: 32.1 g/dL (ref 30.0–36.0)
MCV: 97.2 fL (ref 78.0–100.0)
Platelets: 281 K/uL (ref 150–400)
RBC: 4.61 MIL/uL (ref 3.87–5.11)
RDW: 14.8 % (ref 11.5–15.5)
WBC: 11.5 K/uL — ABNORMAL HIGH (ref 4.0–10.5)

## 2017-11-19 LAB — LIPID PANEL
Cholesterol: 226 mg/dL — ABNORMAL HIGH (ref 0–200)
HDL: 79 mg/dL
LDL Cholesterol: 112 mg/dL — ABNORMAL HIGH (ref 0–99)
Total CHOL/HDL Ratio: 2.9 ratio
Triglycerides: 176 mg/dL — ABNORMAL HIGH
VLDL: 35 mg/dL (ref 0–40)

## 2017-11-19 LAB — RAPID URINE DRUG SCREEN, HOSP PERFORMED
Amphetamines: POSITIVE — AB
Barbiturates: NOT DETECTED
Benzodiazepines: NOT DETECTED
Cocaine: POSITIVE — AB
Opiates: NOT DETECTED
Tetrahydrocannabinol: NOT DETECTED

## 2017-11-19 LAB — PREGNANCY, URINE: Preg Test, Ur: NEGATIVE

## 2017-11-19 LAB — HEMOGLOBIN A1C
Hgb A1c MFr Bld: 5.1 % (ref 4.8–5.6)
Mean Plasma Glucose: 99.67 mg/dL

## 2017-11-19 LAB — ETHANOL: Alcohol, Ethyl (B): <10 mg/dL

## 2017-11-19 MED ORDER — IBUPROFEN 600 MG PO TABS
600.0000 mg | ORAL_TABLET | Freq: Four times a day (QID) | ORAL | Status: DC | PRN
  Administered 2017-11-19: 600 mg via ORAL
  Filled 2017-11-19: qty 1

## 2017-11-19 MED ORDER — DIVALPROEX SODIUM 250 MG PO DR TAB
250.0000 mg | DELAYED_RELEASE_TABLET | Freq: Two times a day (BID) | ORAL | Status: DC
  Administered 2017-11-19 – 2017-11-20 (×3): 250 mg via ORAL
  Filled 2017-11-19 (×6): qty 1

## 2017-11-19 MED ORDER — FLUTICASONE PROPIONATE 50 MCG/ACT NA SUSP
2.0000 | Freq: Every day | NASAL | Status: DC
  Administered 2017-11-19 – 2017-11-26 (×8): 2 via NASAL
  Filled 2017-11-19 (×3): qty 16

## 2017-11-19 MED ORDER — HYDROXYZINE HCL 50 MG PO TABS
50.0000 mg | ORAL_TABLET | Freq: Four times a day (QID) | ORAL | Status: DC | PRN
  Administered 2017-11-19 – 2017-11-26 (×16): 50 mg via ORAL
  Filled 2017-11-19 (×4): qty 1
  Filled 2017-11-19: qty 30
  Filled 2017-11-19 (×12): qty 1

## 2017-11-19 NOTE — [facility] Group Notes (Signed)
[facility] Group Notes:  (Nursing/MHT/Case Management/Adjunct)  Date:  11/19/2017  Time:  1600 Type of Therapy:  Nurse Education- Aromatherapy   Participation Level:  Active  Participation Quality:  Appropriate  Affect:  Appropriate  Cognitive:  Appropriate  Insight:  Appropriate  Engagement in Group:  Engaged  Modes of Intervention:  Discussion and Education  Summary of Progress/Problems:  Conducted by Jan Wright, RN.  

## 2017-11-19 NOTE — [facility] Group Notes (Signed)
LCSW Group Therapy Note  11/19/2017 1:15pm  Type of Therapy and Topic:  Group Therapy: Avoiding Self-Sabotaging and Enabling Behaviors  Participation Level:  Active   Description of Group:   In this group, patients will learn how to identify obstacles, self-sabotaging and enabling behaviors, as well as: what are they, why do we do them and what needs these behaviors meet. Discuss unhealthy relationships and how to have positive healthy boundaries with those that sabotage and enable. Explore aspects of self-sabotage and enabling in yourself and how to limit these self-destructive behaviors in everyday life.   Therapeutic Goals: 1. Patient will identify one obstacle that relates to self-sabotage and enabling behaviors 2. Patient will identify one personal self-sabotaging or enabling behavior they did prior to admission 3. Patient will state a plan to change the above identified behavior 4. Patient will demonstrate ability to communicate their needs through discussion and/or role play.   Summary of Patient Progress:  Lorelle Formosamie was attentive and engaged during today's processing group. She continues to show progress in the group setting with improving insight.    Therapeutic Modalities:   Cognitive Behavioral Therapy Person-Centered Therapy Motivational Interviewing   Pulte HomesHeather N Smart, LCSW 11/19/2017 11:42 AM
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
## 2017-11-19 NOTE — Progress Notes (Signed)
D:Pt has a flat affect and is anxious on the unit. Pt requested pain medication for back pain and cramps. She was given prn tylenol, a warm pack and peace blend aromatherapy for anxiety. Pt rates depression as an 8 and anxiety as a 10 on 0-10 scale with 10 being the most. Pt is requesting a 90 day or longer treatment program. A:Offered support, encouragement and 15 minute checks. R:Pt denies si and hi. Safety maintained on the unit.

## 2017-11-19 NOTE — [facility] Group Notes (Signed)
Adult Psychoeducational Group Note  Date:  11/19/2017 Time:  9:44 PM  Group Topic/Focus:  Wrap-Up Group:   The focus of this group is to help patients review their daily goal of treatment and discuss progress on daily workbooks.  Participation Level:  Active  Participation Quality:  Appropriate and Attentive  Affect:  Appropriate  Cognitive:  Alert and Appropriate  Insight: Appropriate and Good  Engagement in Group:  Engaged  Modes of Intervention:  Discussion and Education  Additional Comments:  Pt attended and participated in wrap up group this evening. Pt rated their day a 5/10 because they have been detoxing, so they are tired and irritable. Pt goal ws to get into long term treatment program.   Amanda NettersOctavia A Aidan Carrillo 11/19/2017, 9:44 PM

## 2017-11-19 NOTE — [facility] Suicide Risk Assessment (Signed)
[facility] INPATIENT:  Family/Significant Other Suicide Prevention Education  Suicide Prevention Education:  Education Completed; Amanda Carrillo (pt's fiance) [phone] has been identified by the patient as the family member/significant other with whom the patient will be residing, and identified as the person(s) who will aid the patient in the event of a mental health crisis (suicidal ideations/suicide attempt).  With written consent from the patient, the family member/significant other has been provided the following suicide prevention education, prior to the and/or following the discharge of the patient.  The suicide prevention education provided includes the following:  Suicide risk factors  Suicide prevention and interventions  National Suicide Hotline telephone number  [facility] assessment telephone number  [facility] Emergency Assistance 911  [facility] and/or Residential [facility] Crisis Unit telephone number  Request made of family/significant other to:  Remove weapons (e.g., guns, rifles, knives), all items previously/currently identified as safety concern.    Remove drugs/medications (over-the-counter, prescriptions, illicit drugs), all items previously/currently identified as a safety concern.  The family member/significant other verbalizes understanding of the suicide prevention education information provided.  The family member/significant other agrees to remove the items of safety concern listed above.  SPE and aftercare reviewed with pt's fiance. He confirmed that guns/weapons have been removed from the home. He is hoping for pt to be accepted [facility] or "somewhere longer." Pt is able to return home if necessary or if she has to [facility].   Rosario Kushner N Smart LCSW 11/19/2017, 12:05 PM
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
## 2017-11-20 MED ORDER — DIVALPROEX SODIUM 500 MG PO DR TAB
500.0000 mg | DELAYED_RELEASE_TABLET | Freq: Every day | ORAL | Status: DC
  Administered 2017-11-20 – 2017-11-25 (×6): 500 mg via ORAL
  Filled 2017-11-20 (×2): qty 1
  Filled 2017-11-20: qty 21
  Filled 2017-11-20: qty 1
  Filled 2017-11-20: qty 21
  Filled 2017-11-20 (×3): qty 1
  Filled 2017-11-20: qty 21

## 2017-11-20 MED ORDER — DIVALPROEX SODIUM 250 MG PO DR TAB
250.0000 mg | DELAYED_RELEASE_TABLET | ORAL | Status: DC
  Administered 2017-11-21 – 2017-11-26 (×6): 250 mg via ORAL
  Filled 2017-11-20: qty 21
  Filled 2017-11-20: qty 1
  Filled 2017-11-20: qty 21
  Filled 2017-11-20 (×3): qty 1
  Filled 2017-11-20: qty 21
  Filled 2017-11-20: qty 1

## 2017-11-20 NOTE — Progress Notes (Signed)
Patient ID: Amanda Carrillo, female   DOB: 05-11-1976, 42 y.o.   MRN: [id number] D: Pt observed in the dayroom interacting with peers. Pt at the time of assessment endorsed moderate anxiety and depression; "there is a lot going on in my mind right now." Pt denied SI/HI or pain. A: Medications offered as prescribed. Pt given the opportunity to ask questions and state concerns. Support, encouragement, and safe environment provided. R: Pt was med compliant. All patient's questions and concerns addressed. 15-minute safety checks continue. Safety checks continue. Pt did attend wrap-up group.
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
## 2017-11-20 NOTE — Progress Notes (Signed)
Recreation Therapy Notes   Date: 4.5.19 Time: 9:30 a.m. Location: [address] Dayroom       Group Topic/Focus: Stress Management   Goal Area(s) Addresses:  Patient will engage in pro-social way in yoga group with.  Patient will demonstrate no behavioral issues during group.   Intervention: Yoga   Clinical Observations/Feedback: Patient did not attend   Sheryle Hailarian Duey Liller, Recreation Therapy Intern  Sheryle HailDarian Cyndal Kasson 11/20/2017 11:47 AM

## 2017-11-20 NOTE — [facility] Group Notes (Signed)
LCSW Group Therapy Note  11/20/2017 1:15pm  Type of Therapy and Topic:  Group Therapy:  Feelings around Relapse and Recovery  Participation Level:  Did Not Attend--pt invited. Chose to remain in bed.   Description of Group:    Patients in this group will discuss emotions they experience before and after a relapse. They will process how experiencing these feelings, or avoidance of experiencing them, relates to having a relapse. Facilitator will guide patients to explore emotions they have related to recovery. Patients will be encouraged to process which emotions are more powerful. They will be guided to discuss the emotional reaction significant others in their lives may have to their relapse or recovery. Patients will be assisted in exploring ways to respond to the emotions of others without this contributing to a relapse.  Therapeutic Goals: 1. Patient will identify two or more emotions that lead to a relapse for them 2. Patient will identify two emotions that result when they relapse 3. Patient will identify two emotions related to recovery 4. Patient will demonstrate ability to communicate their needs through discussion and/or role plays   Summary of Patient Progress:  x   Therapeutic Modalities:   Cognitive Behavioral Therapy Solution-Focused Therapy Assertiveness Training Relapse Prevention Therapy   Ledell PeoplesHeather N Smart, LCSW 11/20/2017 3:03 PM

## 2017-11-20 NOTE — Progress Notes (Signed)
The patient attended the evening A.A.meeting and was appropriate.  

## 2017-11-20 NOTE — Tx Team (Signed)
Interdisciplinary Treatment and Diagnostic Plan Update  11/20/2017 Time of Session: 0830AM Amanda Carrillo MRN: [id number]  Principal Diagnosis: Bipolar 1 disorder, mixed, moderate (HCC)  Secondary Diagnoses: Principal Problem:   Bipolar 1 disorder, mixed, moderate (HCC) Active Problems:   Cocaine use disorder, moderate, dependence (HCC)   Current Medications:  Current Facility-Administered Medications  Medication Dose Route Frequency Provider Last Rate Last Dose  . alum & mag hydroxide-simeth (MAALOX/MYLANTA) 200-200-20 MG/5ML suspension 30 mL  30 mL Oral Q4H PRN Rankin, Shuvon B, NP      . divalproex (DEPAKOTE) DR tablet 250 mg  250 mg Oral BID Micheal Likens, MD   250 mg at 11/20/17 0856  . fluticasone (FLONASE) 50 MCG/ACT nasal spray 2 spray  2 spray Each Nare Daily Micheal Likens, MD   2 spray at 11/20/17 0857  . gabapentin (NEURONTIN) capsule 400 mg  400 mg Oral TID Rankin, Shuvon B, NP   400 mg at 11/20/17 0856  . hydrOXYzine (ATARAX/VISTARIL) tablet 50 mg  50 mg Oral Q6H PRN Micheal Likens, MD   50 mg at 11/20/17 0857  . ibuprofen (ADVIL,MOTRIN) tablet 600 mg  600 mg Oral Q6H PRN Micheal Likens, MD   600 mg at 11/19/17 1611  . magnesium hydroxide (MILK OF MAGNESIA) suspension 30 mL  30 mL Oral Daily PRN Rankin, Shuvon B, NP      . nicotine (NICODERM CQ - dosed in mg/24 hours) patch 21 mg  21 mg Transdermal Daily Rankin, Shuvon B, NP   21 mg at 11/20/17 0859  . OLANZapine (ZYPREXA) tablet 10 mg  10 mg Oral QHS Rankin, Shuvon B, NP   10 mg at 11/19/17 2100  . traZODone (DESYREL) tablet 150 mg  150 mg Oral QHS Rankin, Shuvon B, NP   150 mg at 11/19/17 2100   PTA Medications: Medications Prior to Admission  Medication Sig Dispense Refill Last Dose  . FLUoxetine (PROZAC) 20 MG capsule Take 1 capsule (20 mg total) by mouth daily. For depression 30 capsule 0 11/18/2017  . fluticasone (FLONASE) 50 MCG/ACT nasal spray Place 1 spray into both nostrils  daily. For allergies 1 g 0 11/17/2017  . gabapentin (NEURONTIN) 400 MG capsule Take 1 capsule (400 mg total) by mouth 3 (three) times daily. For agitation 90 capsule 0 11/18/2017  . hydrOXYzine (ATARAX/VISTARIL) 25 MG tablet Take 1 tablet (25 mg total) by mouth every 6 (six) hours as needed for anxiety (Sleep). 75 tablet 0 11/18/2017  . ibuprofen (ADVIL,MOTRIN) 200 MG tablet Take 600 mg by mouth every 6 (six) hours as needed for headache.   11/16/2017  . OLANZapine (ZYPREXA) 10 MG tablet Take 1 tablet (10 mg total) by mouth at bedtime. For mood control 30 tablet 0 11/17/2017  . traZODone (DESYREL) 150 MG tablet Take 1 tablet (150 mg total) by mouth at bedtime. For sleep 30 tablet 0 11/17/2017    Patient Stressors: Financial difficulties Medication change or noncompliance Occupational concerns Substance abuse  Patient Strengths: Ability for insight Average or above average intelligence Communication skills General fund of knowledge Motivation for treatment/growth Supportive family/friends  Treatment Modalities: Medication Management, Group therapy, Case management,  1 to 1 session with clinician, Psychoeducation, Recreational therapy.   Physician Treatment Plan for Primary Diagnosis: Bipolar 1 disorder, mixed, moderate (HCC) Long Term Goal(s): Improvement in symptoms so as ready for discharge Improvement in symptoms so as ready for discharge   Short Term Goals: Ability to identify triggers associated with substance abuse/mental health issues  will improve Ability to demonstrate self-control will improve  Medication Management: Evaluate patient's response, side effects, and tolerance of medication regimen.  Therapeutic Interventions: 1 to 1 sessions, Unit Group sessions and Medication administration.  Evaluation of Outcomes: Progressing  Physician Treatment Plan for Secondary Diagnosis: Principal Problem:   Bipolar 1 disorder, mixed, moderate (HCC) Active Problems:   Cocaine use disorder,  moderate, dependence (HCC)  Long Term Goal(s): Improvement in symptoms so as ready for discharge Improvement in symptoms so as ready for discharge   Short Term Goals: Ability to identify triggers associated with substance abuse/mental health issues will improve Ability to demonstrate self-control will improve     Medication Management: Evaluate patient's response, side effects, and tolerance of medication regimen.  Therapeutic Interventions: 1 to 1 sessions, Unit Group sessions and Medication administration.  Evaluation of Outcomes: Progressing   RN Treatment Plan for Primary Diagnosis: Bipolar 1 disorder, mixed, moderate (HCC) Long Term Goal(s): Knowledge of disease and therapeutic regimen to maintain health will improve  Short Term Goals: Ability to remain free from injury will improve, Ability to identify and develop effective coping behaviors will improve and Compliance with prescribed medications will improve  Medication Management: RN will administer medications as ordered by provider, will assess and evaluate patient's response and provide education to patient for prescribed medication. RN will report any adverse and/or side effects to prescribing provider.  Therapeutic Interventions: 1 on 1 counseling sessions, Psychoeducation, Medication administration, Evaluate responses to treatment, Monitor vital signs and CBGs as ordered, Perform/monitor CIWA, COWS, AIMS and Fall Risk screenings as ordered, Perform wound care treatments as ordered.  Evaluation of Outcomes: Progressing   LCSW Treatment Plan for Primary Diagnosis: Bipolar 1 disorder, mixed, moderate (HCC) Long Term Goal(s): Safe transition to appropriate next level of care at discharge, Engage patient in therapeutic group addressing interpersonal concerns.  Short Term Goals: Engage patient in aftercare planning with referrals and resources, Facilitate patient progression through stages of change regarding substance use  diagnoses and concerns and Identify triggers associated with mental health/substance abuse issues  Therapeutic Interventions: Assess for all discharge needs, 1 to 1 time with Social worker, Explore available resources and support systems, Assess for adequacy in community support network, Educate family and significant other(s) on suicide prevention, Complete Psychosocial Assessment, Interpersonal group therapy.  Evaluation of Outcomes: Progressing   Progress in Treatment: Attending groups: Yes. Participating in groups: Yes. Taking medication as prescribed: Yes. Toleration medication: Yes. Family/Significant other contact made: Yes, individual(s) contacted:  pt's fiance. Collateral contact obtained and SPE completed.  Patient understands diagnosis: Yes. Discussing patient identified problems/goals with staff: Yes. Medical problems stabilized or resolved: Yes. Denies suicidal/homicidal ideation: Yes. Issues/concerns per patient self-inventory: No. Other: n/a   New problem(s) identified: No, Describe:  n/a  New Short Term/Long Term Goal(s): detox, medication management for mood stabilization; elimination of SI thoughts; development of comprehensive mental wellness/sobriety plan.   Patient Goal: "To get sober and get into treatment."   Discharge Plan or Barriers: CSW assessing for appropriate referrals. [facility] and has not been following up with outpatient providers since her discharge. [facility] pamphlet and AA/NA information provided for additional community support.   Reason for Continuation of Hospitalization: Anxiety Depression Medication stabilization Withdrawal symptoms  Estimated Length of Stay: Tuesday, 11/24/17  Attendees: Patient: Amanda Carrillo 11/20/2017 9:06 AM  Physician: Dr. Altamese Carolinaainville MD; Dr. Jama Flavorsobos MD 11/20/2017 9:06 AM  Nursing: Erskine SquibbJane RN; Olivette RN 11/20/2017 9:06 AM  RN Care Manager:x 11/20/2017 9:06 AM  Social Worker: [employer], LCSW 11/20/2017 9:06  AM  Recreational Therapist: x 11/20/2017 9:06 AM  Other: Feliz Beam Money NP 11/20/2017 9:06 AM  Other:  11/20/2017 9:06 AM  Other: 11/20/2017 9:06 AM    Scribe for Treatment Team: Ledell Peoples Smart, LCSW 11/20/2017 9:06 AM

## 2017-11-20 NOTE — Progress Notes (Signed)
[facility] MD Progress Note  11/20/2017 2:08 PM [name]  MRN:  [id number] Subjective:    Amanda Carrillo is a 42 y/o F with history of Bipolar I and polysubstance abuse who was admitted voluntarily as a walk-in to [facility] with worsening depression, SI, recent suicide attempt via hanging, and worsening substance use. Pt has recent relevant history of discharge from [facility] on 11/09/17. She was stabilized on prozac and olanzapine, and she was referred to [facility] for follow up. Pt had indicated on initial intake that she was struggling with mood swings, agitation, depression, and had attempted to hang herself prior to coming back to [facility] for treatment. She also reports relapse on cocaine, alcohol, and methamphetamine. She was restarted on zyprexa and discontinued from prozac. She was started on trial of depakote. She met with SW team regarding referral to substance use treatment programs.  Upon interview today, pt shares, "It's going good, just a little sleepy." She notes some improvement of her presenting symptoms. She denies specific concerns aside from some fatigue today. She denies physical complaints. She denies SI/HI/AH/VH. Her appetite is good. She is sleeping well. She has meet with the SW team and she is interested in referral to [facility] for substance use treatment. We will increase her evening dose of depakote tonight. She is in agreement with the above plan, and she had no further questions, comments, or concerns.  Principal Problem: Bipolar 1 disorder, mixed, moderate ([place]) Diagnosis:   Patient Active Problem List   Diagnosis Date Noted  . Cocaine use disorder, moderate, dependence ([place]) [F14.20] 11/19/2017  . Bipolar 1 disorder, mixed, moderate ([place]) [F31.62] 11/05/2017  . MDD (major depressive disorder), severe ([place]) [F32.2] 11/04/2017  . Opioid use disorder, severe, dependence ([place]) [F11.20] 08/07/2017  . Opioid use disorder ([place]) [F11.99] 08/04/2017  . Depression [F32.9]    Total Time spent with  patient: 30 minutes  Past Psychiatric History: see H&P  Past Medical History:  Past Medical History:  Diagnosis Date  . ADHD (attention deficit hyperactivity disorder)   . Anxiety   . Depression   . Gastroparesis 01/2017  . Polysubstance abuse ([place])    herion, methadone, etoh    Past Surgical History:  Procedure Laterality Date  . CHOLECYSTECTOMY    . TUBAL LIGATION    . TUBAL LIGATION     Family History:  Family History  Problem Relation Age of Onset  . Pneumonia Other   . Depression Mother    Family Psychiatric  History: see H&P Social History:  Social History   Substance and Sexual Activity  Alcohol Use Yes  . Alcohol/week: 4.8 oz  . Types: 8 Cans of beer per week   Comment: 8 beers every day     Social History   Substance and Sexual Activity  Drug Use Yes  . Types: IV, Cocaine   Comment: $100 daily cocaine    Social History   Socioeconomic History  . Marital status: Divorced    Spouse name: Not on file  . Number of children: Not on file  . Years of education: Not on file  . Highest education level: Not on file  Occupational History  . Not on file  Social Needs  . Financial resource strain: Not on file  . Food insecurity:    Worry: Not on file    Inability: Not on file  . Transportation needs:    Medical: Not on file    Non-medical: Not on file  Tobacco Use  . Smoking status:  Current Every Day Smoker    Packs/day: 2.00    Years: 15.00    Pack years: 30.00    Types: Cigarettes  . Smokeless tobacco: Never Used  Substance and Sexual Activity  . Alcohol use: Yes    Alcohol/week: 4.8 oz    Types: 8 Cans of beer per week    Comment: 8 beers every day  . Drug use: Yes    Types: IV, Cocaine    Comment: $100 daily cocaine  . Sexual activity: Yes    Birth control/protection: Surgical  Lifestyle  . Physical activity:    Days per week: Not on file    Minutes per session: Not on file  . Stress: Not on file  Relationships  . Social connections:     Talks on phone: Not on file    Gets together: Not on file    Attends religious service: Not on file    Active member of club or organization: Not on file    Attends meetings of clubs or organizations: Not on file    Relationship status: Not on file  Other Topics Concern  . Not on file  Social History Narrative  . Not on file   Additional Social History:    Pain Medications: none Prescriptions: prozac   flonase    neurotin   vistaril   zyprexa   trazadone Over the Counter: none History of alcohol / drug use?: Yes Longest period of sobriety (when/how long): unknown Negative Consequences of Use: Financial, Personal relationships, Work / Youth worker Withdrawal Symptoms: Other (Comment)(anxiety, depression) Name of Substance 1: cocaine 1 - Age of First Use: mid 20's 1 - Amount (size/oz): $100.00 per day 1 - Frequency: daily 1 - Duration: unsure 1 - Last Use / Amount: unsure Name of Substance 2: alcohol 2 - Age of First Use: 42 yrs old 2 - Amount (size/oz): 8 beers daily 2 - Frequency: daily 2 - Duration: 20 days 2 - Last Use / Amount: 8 beers                Sleep: Good  Appetite:  Good  Current Medications: Current Facility-Administered Medications  Medication Dose Route Frequency Provider Last Rate Last Dose  . alum & mag hydroxide-simeth (MAALOX/MYLANTA) 200-200-20 MG/5ML suspension 30 mL  30 mL Oral Q4H PRN Rankin, Shuvon B, NP      . [START ON 11/21/2017] divalproex (DEPAKOTE) DR tablet 250 mg  250 mg Oral BH-q7a Pennelope Bracken, MD       And  . divalproex (DEPAKOTE) DR tablet 500 mg  500 mg Oral QHS Pennelope Bracken, MD      . fluticasone (FLONASE) 50 MCG/ACT nasal spray 2 spray  2 spray Each Nare Daily Pennelope Bracken, MD   2 spray at 11/20/17 0857  . gabapentin (NEURONTIN) capsule 400 mg  400 mg Oral TID Rankin, Shuvon B, NP   400 mg at 11/20/17 1318  . hydrOXYzine (ATARAX/VISTARIL) tablet 50 mg  50 mg Oral Q6H PRN Pennelope Bracken, MD   50 mg at 11/20/17 0857  . ibuprofen (ADVIL,MOTRIN) tablet 600 mg  600 mg Oral Q6H PRN Pennelope Bracken, MD   600 mg at 11/19/17 1611  . magnesium hydroxide (MILK OF MAGNESIA) suspension 30 mL  30 mL Oral Daily PRN Rankin, Shuvon B, NP      . nicotine (NICODERM CQ - dosed in mg/24 hours) patch 21 mg  21 mg Transdermal Daily Rankin, Shuvon B, NP  21 mg at 11/20/17 0859  . OLANZapine (ZYPREXA) tablet 10 mg  10 mg Oral QHS Rankin, Shuvon B, NP   10 mg at 11/19/17 2100  . traZODone (DESYREL) tablet 150 mg  150 mg Oral QHS Rankin, Shuvon B, NP   150 mg at 11/19/17 2100    Lab Results:  Results for orders placed or performed during the hospital encounter of 11/18/17 (from the past 48 hour(s))  Urinalysis, Complete w Microscopic     Status: Abnormal   Collection Time: 11/18/17  4:46 PM  Result Value Ref Range   Color, Urine YELLOW YELLOW   APPearance CLEAR CLEAR   Specific Gravity, Urine 1.016 1.005 - 1.030   pH 6.0 5.0 - 8.0   Glucose, UA NEGATIVE NEGATIVE mg/dL   Hgb urine dipstick NEGATIVE NEGATIVE   Bilirubin Urine NEGATIVE NEGATIVE   Ketones, ur NEGATIVE NEGATIVE mg/dL   Protein, ur NEGATIVE NEGATIVE mg/dL   Nitrite NEGATIVE NEGATIVE   Leukocytes, UA NEGATIVE NEGATIVE   RBC / HPF 0-5 0 - 5 RBC/hpf   WBC, UA 0-5 0 - 5 WBC/hpf   Bacteria, UA RARE (A) NONE SEEN   Squamous Epithelial / LPF 0-5 (A) NONE SEEN    Comment: Performed at [facility], [place] [address], [place], [place] [address]  Pregnancy, urine     Status: None   Collection Time: 11/18/17  4:46 PM  Result Value Ref Range   Preg Test, Ur NEGATIVE NEGATIVE    Comment:        THE SENSITIVITY OF THIS METHODOLOGY IS >20 mIU/mL. Performed at [facility], [place] [address], [place], [place] [address]   Urine rapid drug screen (hosp performed)not at [facility]     Status: Abnormal   Collection Time: 11/18/17  4:46 PM  Result Value Ref Range   Opiates NONE DETECTED NONE DETECTED    Cocaine POSITIVE (A) NONE DETECTED   Benzodiazepines NONE DETECTED NONE DETECTED   Amphetamines POSITIVE (A) NONE DETECTED   Tetrahydrocannabinol NONE DETECTED NONE DETECTED   Barbiturates NONE DETECTED NONE DETECTED    Comment: (NOTE) DRUG SCREEN FOR MEDICAL PURPOSES ONLY.  IF CONFIRMATION IS NEEDED FOR ANY PURPOSE, NOTIFY LAB WITHIN 5 DAYS. LOWEST DETECTABLE LIMITS FOR URINE DRUG SCREEN Drug Class                     Cutoff (ng/mL) Amphetamine and metabolites    1000 Barbiturate and metabolites    200 Benzodiazepine                 970 Tricyclics and metabolites     300 Opiates and metabolites        300 Cocaine and metabolites        300 THC                            50 Performed at [facility], [place] [address] [place], [place] [address]   CBC     Status: Abnormal   Collection Time: 11/19/17  6:56 AM  Result Value Ref Range   WBC 11.5 (H) 4.0 - 10.5 K/uL   RBC 4.61 3.87 - 5.11 MIL/uL   Hemoglobin 14.4 12.0 - 15.0 g/dL   HCT 44.8 36.0 - 46.0 %   MCV 97.2 78.0 - 100.0 fL   MCH 31.2 26.0 - 34.0 pg   MCHC 32.1 30.0 - 36.0 g/dL   RDW 14.8 11.5 -  15.5 %   Platelets 281 150 - 400 K/uL    Comment: Performed at [facility], [place] [address] [place], [place] [address]  Comprehensive metabolic panel     Status: None   Collection Time: 11/19/17  6:56 AM  Result Value Ref Range   Sodium 140 135 - 145 mmol/L   Potassium 4.1 3.5 - 5.1 mmol/L   Chloride 105 101 - 111 mmol/L   CO2 25 22 - 32 mmol/L   Glucose, Bld 96 65 - 99 mg/dL   BUN 17 6 - 20 mg/dL   Creatinine, Ser 0.78 0.44 - 1.00 mg/dL   Calcium 9.6 8.9 - 10.3 mg/dL   Total Protein 7.9 6.5 - 8.1 g/dL   Albumin 4.2 3.5 - 5.0 g/dL   AST 16 15 - 41 U/L   ALT 23 14 - 54 U/L   Alkaline Phosphatase 68 38 - 126 U/L   Total Bilirubin 0.9 0.3 - 1.2 mg/dL   GFR calc non Af Amer >60 >60 mL/min   GFR calc Af Amer >60 >60 mL/min    Comment: (NOTE) The eGFR has been calculated using the CKD  EPI equation. This calculation has not been validated in all clinical situations. eGFR's persistently <60 mL/min signify possible Chronic Kidney Disease.    Anion gap 10 5 - 15    Comment: Performed at [facility], [place] [address] [place], [place] [address]  Hemoglobin A1c     Status: None   Collection Time: 11/19/17  6:56 AM  Result Value Ref Range   Hgb A1c MFr Bld 5.1 4.8 - 5.6 %    Comment: (NOTE) Pre diabetes:          5.7%-6.4% Diabetes:              >6.4% Glycemic control for   <7.0% adults with diabetes    Mean Plasma Glucose 99.67 mg/dL    Comment: Performed at [facility] [address] [place], [place] [address]  Lipid panel     Status: Abnormal   Collection Time: 11/19/17  6:56 AM  Result Value Ref Range   Cholesterol 226 (H) 0 - 200 mg/dL   Triglycerides 176 (H) <150 mg/dL   HDL 79 >40 mg/dL   Total CHOL/HDL Ratio 2.9 RATIO   VLDL 35 0 - 40 mg/dL   LDL Cholesterol 112 (H) 0 - 99 mg/dL    Comment:        Total Cholesterol/HDL:CHD Risk Coronary Heart Disease Risk Table                     Men   Women  1/2 Average Risk   3.4   3.3  Average Risk       5.0   4.4  2 X Average Risk   9.6   7.1  3 X Average Risk  23.4   11.0        Use the calculated Patient Ratio above and the CHD Risk Table to determine the patient's CHD Risk.        ATP III CLASSIFICATION (LDL):  <100     mg/dL   Optimal  100-129  mg/dL   Near or Above                    Optimal  130-159  mg/dL   Borderline  160-189  mg/dL   High  >190     mg/dL   Very  High Performed at [facility], [place] [address] [place], [place] [address]   Ethanol     Status: None   Collection Time: 11/19/17  6:56 AM  Result Value Ref Range   Alcohol, Ethyl (B) <10 <10 mg/dL    Comment:        LOWEST DETECTABLE LIMIT FOR SERUM ALCOHOL IS 10 mg/dL FOR MEDICAL PURPOSES ONLY Performed at [facility] [address] [place], [place] [address]      Blood Alcohol level:  Lab Results  Component Value Date   ETH <10 11/19/2017   ETH <10 76/73/4193    Metabolic Disorder Labs: Lab Results  Component Value Date   HGBA1C 5.1 11/19/2017   MPG 99.67 11/19/2017   No results found for: PROLACTIN Lab Results  Component Value Date   CHOL 226 (H) 11/19/2017   TRIG 176 (H) 11/19/2017   HDL 79 11/19/2017   CHOLHDL 2.9 11/19/2017   VLDL 35 11/19/2017   LDLCALC 112 (H) 11/19/2017    Physical Findings: AIMS: Facial and Oral Movements Muscles of Facial Expression: None, normal Lips and Perioral Area: None, normal Jaw: None, normal Tongue: None, normal,Extremity Movements Upper (arms, wrists, hands, fingers): None, normal Lower (legs, knees, ankles, toes): None, normal, Trunk Movements Neck, shoulders, hips: None, normal, Overall Severity Severity of abnormal movements (highest score from questions above): None, normal Incapacitation due to abnormal movements: None, normal Patient's awareness of abnormal movements (rate only patient's report): No Awareness, Dental Status Current problems with teeth and/or dentures?: No Does patient usually wear dentures?: No  CIWA:  CIWA-Ar Total: 1 COWS:  COWS Total Score: 2  Musculoskeletal: Strength & Muscle Tone: within normal limits Gait & Station: normal Patient leans: N/A  Psychiatric Specialty Exam: Physical Exam  Nursing note and vitals reviewed.   Review of Systems  Constitutional: Positive for malaise/fatigue. Negative for chills and fever.  Respiratory: Negative for cough and shortness of breath.   Cardiovascular: Negative for chest pain.  Gastrointestinal: Negative for abdominal pain, heartburn, nausea and vomiting.  Psychiatric/Behavioral: Positive for depression. Negative for hallucinations and suicidal ideas. The patient is nervous/anxious. The patient does not have insomnia.     Blood pressure 125/86, pulse 92, temperature 98.3 F (36.8 C), temperature source Oral,  resp. rate 16, height 5' 7.75" (1.721 m), weight 90.4 kg (199 lb 4 oz), last menstrual period 10/16/2017.Body mass index is 30.52 kg/m.  General Appearance: Casual and Fairly Groomed  Eye Contact:  Good  Speech:  Clear and Coherent and Normal Rate  Volume:  Normal  Mood:  Euthymic  Affect:  Appropriate, Congruent and Constricted  Thought Process:  Coherent and Goal Directed  Orientation:  Full (Time, Place, and Person)  Thought Content:  Logical  Suicidal Thoughts:  No  Homicidal Thoughts:  No  Memory:  Immediate;   Fair Recent;   Fair Remote;   Fair  Judgement:  Fair  Insight:  Fair  Psychomotor Activity:  Normal  Concentration:  Concentration: Good  Recall:  Good  Fund of Knowledge:  Good  Language:  Good  Akathisia:  No  Handed:    AIMS (if indicated):     Assets:  Communication Skills Resilience Social Support  ADL's:  Intact  Cognition:  WNL  Sleep:  Number of Hours: 6.75   Treatment Plan Summary: Daily contact with patient to assess and evaluate symptoms and progress in treatment and Medication management   -Continue inpatient hospitalization  -Bipolar I, current episode mixed   -  Change depakote DR 277m po BID to depakote DR 2560mpo qAM + 50054mo qhs   - Continue zyprexa 34m67m qhs  -Anxiety  -Continue vistaril 50mg77mq6h prn anxiety   -Continue gabapentin 400mg 51mID  -Insomnia   -Continue trazodone 150mg p53ms   -Encourage participation in groups and therapeutic milieu  -Disposition planning will be ongoing  ChristoPennelope Bracken5/2019, 2:08 PM

## 2017-11-20 NOTE — Progress Notes (Signed)
D: Pt A & O X4. Visible in milieu intermittently this shift for meals and medications. Denies SI, HI, AVh and pain when assessed. Endorsed continued anxiety with detox "well, I'm coming off drugs, I just feel anxious, I need something". Reports she slept well last night with good appetite. A: All medications given as ordered with verbal education and effects monitored. Support and encouragement provided to pt. Q 15 minutes safety checks maintained without self harm gestures or outburst. R: Pt did not attend groups despite multiple prompts. Off unit for meals and activities, returned without issues. Took her medications when offered. Denies adverse drug reactions at this time. POC continues for safety and mood stability.

## 2017-11-21 DIAGNOSIS — F1099 Alcohol use, unspecified with unspecified alcohol-induced disorder: Secondary | ICD-10-CM

## 2017-11-21 MED ORDER — NICOTINE POLACRILEX 2 MG MT GUM
2.0000 mg | CHEWING_GUM | OROMUCOSAL | Status: DC | PRN
  Administered 2017-11-21 – 2017-11-25 (×6): 2 mg via ORAL
  Filled 2017-11-21: qty 1

## 2017-11-21 NOTE — Plan of Care (Signed)
  Problem: Self-Concept: Goal: Ability to disclose and discuss suicidal ideas will improve Outcome: Progressing Note:  Pt endorses passive SI without plan.  She verbally contracts for safety.

## 2017-11-21 NOTE — Progress Notes (Signed)
D: Pt was in the dayroom upon initial approach.  Pt presents with anxious, depressed affect and mood.  She reports her day was "not good at all" because "my fiance was calling and putting me down."  She did not want to take calls from him tonight and Diplomatic Services operational officersecretary was notified.  Goal is to "try to stay up for the AA meeting tonight, have a good night."  Pt denies HI, denies hallucinations, denies pain.  She endorses passive SI without a plan.  She states "I just wish I wasn't alive."  Pt verbally contracts for safety.  Pt has been visible in milieu interacting with peers and staff appropriately.  Pt attended part of evening group.    A: Introduced self to pt.  Actively listened to pt and offered support and encouragement. Medications administered per order.  PRN medication administered for anxiety.  Q15 minute safety checks maintained.  R: Pt is safe on the unit.  Pt is compliant with medications.  Pt verbally contracts for safety.  Will continue to monitor and assess.

## 2017-11-21 NOTE — Progress Notes (Signed)
Patient only attended the first twenty minutes of the evening speaker AA meeting. Pt exited group and returned to her bed.

## 2017-11-21 NOTE — Progress Notes (Addendum)
[facility] MD Progress Note  11/21/2017 1:19 PM Amanda Carrillo  MRN:  [id number]   Subjective:  Patient reports that she is feeling worse today. "I had a phone call with my fiance and he was arguing with her and accusing her of cheating ion him 2 years ago." She reports worsening depression, but denies any SI/HI/AVH and contracts for safety. She states she will go to the day room and talk to some people. She denies any medication side effects. She reports sleeping better and having a good appetite.   Objective: Patient's chart and findings reviewed and discussed with treatment team. Patient presents in her bed and appears flat and depressed. She need to attend more groups and will ask peer support to counsel her as she has poor coping skills.    Principal Problem: Bipolar 1 disorder, mixed, moderate (HCC) Diagnosis:   Patient Active Problem List   Diagnosis Date Noted  . Cocaine use disorder, moderate, dependence (HCC) [F14.20] 11/19/2017  . Bipolar 1 disorder, mixed, moderate (HCC) [F31.62] 11/05/2017  . MDD (major depressive disorder), severe (HCC) [F32.2] 11/04/2017  . Opioid use disorder, severe, dependence (HCC) [F11.20] 08/07/2017  . Opioid use disorder (HCC) [F11.99] 08/04/2017  . Depression [F32.9]    Total Time spent with patient: 15 minutes  Past Psychiatric History: See H&P  Past Medical History:  Past Medical History:  Diagnosis Date  . ADHD (attention deficit hyperactivity disorder)   . Anxiety   . Depression   . Gastroparesis 01/2017  . Polysubstance abuse (HCC)    herion, methadone, etoh    Past Surgical History:  Procedure Laterality Date  . CHOLECYSTECTOMY    . TUBAL LIGATION    . TUBAL LIGATION     Family History:  Family History  Problem Relation Age of Onset  . Pneumonia Other   . Depression Mother    Family Psychiatric  History: See H&P Social History:  Social History   Substance and Sexual Activity  Alcohol Use Yes  . Alcohol/week: 4.8 oz  . Types: 8  Cans of beer per week   Comment: 8 beers every day     Social History   Substance and Sexual Activity  Drug Use Yes  . Types: IV, Cocaine   Comment: $100 daily cocaine    Social History   Socioeconomic History  . Marital status: Divorced    Spouse name: Not on file  . Number of children: Not on file  . Years of education: Not on file  . Highest education level: Not on file  Occupational History  . Not on file  Social Needs  . Financial resource strain: Not on file  . Food insecurity:    Worry: Not on file    Inability: Not on file  . Transportation needs:    Medical: Not on file    Non-medical: Not on file  Tobacco Use  . Smoking status: Current Every Day Smoker    Packs/day: 2.00    Years: 15.00    Pack years: 30.00    Types: Cigarettes  . Smokeless tobacco: Never Used  Substance and Sexual Activity  . Alcohol use: Yes    Alcohol/week: 4.8 oz    Types: 8 Cans of beer per week    Comment: 8 beers every day  . Drug use: Yes    Types: IV, Cocaine    Comment: $100 daily cocaine  . Sexual activity: Yes    Birth control/protection: Surgical  Lifestyle  . Physical activity:  Days per week: Not on file    Minutes per session: Not on file  . Stress: Not on file  Relationships  . Social connections:    Talks on phone: Not on file    Gets together: Not on file    Attends religious service: Not on file    Active member of club or organization: Not on file    Attends meetings of clubs or organizations: Not on file    Relationship status: Not on file  Other Topics Concern  . Not on file  Social History Narrative  . Not on file   Additional Social History:    Pain Medications: none Prescriptions: prozac   flonase    neurotin   vistaril   zyprexa   trazadone Over the Counter: none History of alcohol / drug use?: Yes Longest period of sobriety (when/how long): unknown Negative Consequences of Use: Financial, Personal relationships, Work / Programmer, multimediachool Withdrawal  Symptoms: Other (Comment)(anxiety, depression) Name of Substance 1: cocaine 1 - Age of First Use: mid 20's 1 - Amount (size/oz): $100.00 per day 1 - Frequency: daily 1 - Duration: unsure 1 - Last Use / Amount: unsure Name of Substance 2: alcohol 2 - Age of First Use: 42 yrs old 2 - Amount (size/oz): 8 beers daily 2 - Frequency: daily 2 - Duration: 20 days 2 - Last Use / Amount: 8 beers                Sleep: Good  Appetite:  Good  Current Medications: Current Facility-Administered Medications  Medication Dose Route Frequency Provider Last Rate Last Dose  . alum & mag hydroxide-simeth (MAALOX/MYLANTA) 200-200-20 MG/5ML suspension 30 mL  30 mL Oral Q4H PRN Rankin, Shuvon B, NP      . divalproex (DEPAKOTE) DR tablet 250 mg  250 mg Oral Billie LadeBH-q7a [place], Christopher T, MD   250 mg at 11/21/17 [id number]   And  . divalproex (DEPAKOTE) DR tablet 500 mg  500 mg Oral QHS Micheal [place], Christopher T, MD   500 mg at 11/20/17 2114  . fluticasone (FLONASE) 50 MCG/ACT nasal spray 2 spray  2 spray Each Nare Daily Micheal [place], Christopher T, MD   2 spray at 11/21/17 0818  . gabapentin (NEURONTIN) capsule 400 mg  400 mg Oral TID Rankin, Shuvon B, NP   400 mg at 11/21/17 1203  . hydrOXYzine (ATARAX/VISTARIL) tablet 50 mg  50 mg Oral Q6H PRN Micheal [place], Christopher T, MD   50 mg at 11/21/17 [id number]  . ibuprofen (ADVIL,MOTRIN) tablet 600 mg  600 mg Oral Q6H PRN Micheal [place], Christopher T, MD   600 mg at 11/19/17 1611  . magnesium hydroxide (MILK OF MAGNESIA) suspension 30 mL  30 mL Oral Daily PRN Rankin, Shuvon B, NP      . nicotine (NICODERM CQ - dosed in mg/24 hours) patch 21 mg  21 mg Transdermal Daily Rankin, Shuvon B, NP   21 mg at 11/21/17 0821  . OLANZapine (ZYPREXA) tablet 10 mg  10 mg Oral QHS Rankin, Shuvon B, NP   10 mg at 11/20/17 2114  . traZODone (DESYREL) tablet 150 mg  150 mg Oral QHS Rankin, Shuvon B, NP   150 mg at 11/20/17 2114    Lab Results: No results found for this or any previous visit  (from the past 48 hour(s)).  Blood Alcohol level:  Lab Results  Component Value Date   [facility]   ETH <10 11/03/2017    Metabolic Disorder Labs: Lab Results  Component Value Date   HGBA1C 5.1 11/19/2017   MPG 99.67 11/19/2017   No results found for: PROLACTIN Lab Results  Component Value Date   CHOL 226 (H) 11/19/2017   TRIG 176 (H) 11/19/2017   HDL 79 11/19/2017   CHOLHDL 2.9 11/19/2017   VLDL 35 11/19/2017   LDLCALC 112 (H) 11/19/2017    Physical Findings: AIMS: Facial and Oral Movements Muscles of Facial Expression: None, normal Lips and Perioral Area: None, normal Jaw: None, normal Tongue: None, normal,Extremity Movements Upper (arms, wrists, hands, fingers): None, normal Lower (legs, knees, ankles, toes): None, normal, Trunk Movements Neck, shoulders, hips: None, normal, Overall Severity Severity of abnormal movements (highest score from questions above): None, normal Incapacitation due to abnormal movements: None, normal Patient's awareness of abnormal movements (rate only patient's report): No Awareness, Dental Status Current problems with teeth and/or dentures?: No Does patient usually wear dentures?: No  CIWA:  CIWA-Ar Total: 1 COWS:  COWS Total Score: 2  Musculoskeletal: Strength & Muscle Tone: within normal limits Gait & Station: normal Patient leans: N/A  Psychiatric Specialty Exam: Physical Exam  Nursing note and vitals reviewed. Constitutional: She is oriented to person, place, and time. She appears well-developed and well-nourished.  Cardiovascular: Normal rate.  Respiratory: Effort normal.  Musculoskeletal: Normal range of motion.  Neurological: She is alert and oriented to person, place, and time.  Skin: Skin is warm.    Review of Systems  Constitutional: Negative.   HENT: Negative.   Eyes: Negative.   Respiratory: Negative.   Cardiovascular: Negative.   Gastrointestinal: Negative.   Genitourinary: Negative.    Musculoskeletal: Negative.   Skin: Negative.   Neurological: Negative.   Psychiatric/Behavioral: Positive for depression and substance abuse. Negative for hallucinations and suicidal ideas. The patient is nervous/anxious.     Blood pressure 98/72, pulse 93, temperature 98 F (36.7 C), resp. rate 18, height 5' 7.75" (1.721 m), weight 90.4 kg (199 lb 4 oz), last menstrual period 10/16/2017.Body mass index is 30.52 kg/m.  General Appearance: Casual  Eye Contact:  Good  Speech:  Clear and Coherent and Normal Rate  Volume:  Normal  Mood:  Depressed  Affect:  Depressed and Flat  Thought Process:  Goal Directed and Descriptions of Associations: Intact  Orientation:  Full (Time, Place, and Person)  Thought Content:  WDL  Suicidal Thoughts:  No  Homicidal Thoughts:  No  Memory:  Immediate;   Good Recent;   Good Remote;   Good  Judgement:  Fair  Insight:  Good  Psychomotor Activity:  Normal  Concentration:  Concentration: Good and Attention Span: Good  Recall:  Good  Fund of Knowledge:  Good  Language:  Good  Akathisia:  No  Handed:  Right  AIMS (if indicated):     Assets:  Communication Skills Desire for Improvement Financial Resources/Insurance Housing Physical Health Social Support Transportation  ADL's:  Intact  Cognition:  WNL  Sleep:  Number of Hours: 6.75   Problems Addressed: Bipolar I Cocaine use disorder   Treatment Plan Summary: Daily contact with patient to assess and evaluate symptoms and progress in treatment, Medication management and Plan is to:  -Continue Depakote 250 mg PO QAM and 500 mg QHS for mood stability -Continue Neurontin 400 mg TID for withdrawal symptoms -Continue Zyprexa 10 mg PO QHS for mood stability -Continue Trazodone 150 mg QHS for mood and sleep -Continue Vistaril 50 mg Q6H PRN for anxiety -Encourage group therapy participation  Maryfrances Bunnell, FNP 11/21/2017, 1:19 PM  Agree with NP Progress Note

## 2017-11-21 NOTE — Progress Notes (Signed)
D: Patient is visible in the milieu and is observed interacting well with others. She is pleasant with staff.  She continues to report some minimal withdrawal symptoms such as irritability and agitation. She denies any thoughts of self harm.  Her affect is flat and her mood is depressed.  She rates her depression, hopelessness and anxiety as a 10.  [facility]  She has good insight regarding her substance abuse.  A: Continue to monitor medication management and MD orders.  Safety checks completed every 15 minutes per protocol.  Offer support and education per protocol.    R: Patient is receptive to staff; her behavior is appropriate.

## 2017-11-22 NOTE — Plan of Care (Signed)
Pt reports good sleep last night.

## 2017-11-22 NOTE — [facility] Group Notes (Signed)
[facility] Group Notes:  (Nursing/MHT/Case Management/Adjunct)  Date:  11/22/2017  Time:  5:36 PM Type of Therapy:  Psychoeducational Skills  Participation Level:  Active  Participation Quality:  Appropriate  Affect:  Appropriate  Cognitive:  Appropriate  Insight:  Appropriate  Engagement in Group:  Engaged  Modes of Intervention:  Problem-solving Summary of Progress/Problems: Pt shared with each about their story and how they battle with addiction.    Damere Brandenburg O Mikeisha Lemonds 11/22/2017, 5:36 PM  

## 2017-11-22 NOTE — [facility] Group Notes (Signed)
[facility] Group Notes:  (Nursing/MHT/Case Management/Adjunct)  Date:  11/22/2017  Time:  5:36 PM Type of Therapy:  Psychoeducational Skills  Participation Level:  Active  Participation Quality:  Appropriate  Affect:  Appropriate  Cognitive:  Appropriate  Insight:  Appropriate  Engagement in Group:  Engaged  Modes of Intervention:  Problem-solving Summary of Progress/Problems: Pt shared with each about their story and how they battle with addiction.    Amanda Carrillo O Amanda Carrillo 11/22/2017, 5:36 PM  

## 2017-11-22 NOTE — Progress Notes (Signed)
DAR NOTE: Pt present with flat affect and depressed mood in the unit. Pt has been isolating herself and has been bed most of the time. Pt denies physical pain, took all her meds as scheduled. As per self inventory, pt had a good night sleep, good appetite, normal energy, and good concentration. Pt rate depression at 8, hopeless ness at 8, and anxiety at 8. Pt's safety ensured with 15 minute and environmental checks. Pt currently denies SI/HI and A/V hallucinations. Pt verbally agrees to seek staff if SI/HI or A/VH occurs and to consult with staff before acting on these thoughts. Will continue POC.

## 2017-11-22 NOTE — Progress Notes (Signed)
D: Pt was in the dayroom upon initial approach.  Pt presents with anxious, depressed affect and mood.  Pt reports "I've been agitated and I don't know why."  Pt does not appear agitated while talking to writer or when observed interacting with peers in the dayroom.  [facility] Pharmacologistcoping skills."  She reports that talking to peers is a positive Associate Professorcoping skill.  Pt denies SI/HI, denies hallucinations, denies pain.  Pt has been visible in milieu interacting with peers and staff appropriately.  Pt attended part of evening group.    A: Introduced self to pt.  Actively listened to pt and offered support and encouragement. Medications administered per order.  PRN medication administered for anxiety.  Positive coping skills encouraged and reinforced.  Pt provided with journal.  Q15 minute safety checks maintained.  R: Pt is safe on the unit.  Pt is compliant with medications.  Pt verbally contracts for safety.  Will continue to monitor and assess.

## 2017-11-22 NOTE — [facility] Group Notes (Signed)
[facility] LCSW Group Therapy Note  Date/Time: 11/22/17, 0900  Type of Therapy/Topic:  Group Therapy:  Feelings about Diagnosis  Participation Level:  Did Not Attend   Mood:   Description of Group:    This group will allow patients to explore their thoughts and feelings about diagnoses they have received. Patients will be guided to explore their level of understanding and acceptance of these diagnoses. Facilitator will encourage patients to process their thoughts and feelings about the reactions of others to their diagnosis, and will guide patients in identifying ways to discuss their diagnosis with significant others in their lives. This group will be process-oriented, with patients participating in exploration of their own experiences as well as giving and receiving support and challenge from other group members.   Therapeutic Goals: 1. Patient will demonstrate understanding of diagnosis as evidence by identifying two or more symptoms of the disorder:  2. Patient will be able to express two feelings regarding the diagnosis 3. Patient will demonstrate ability to communicate their needs through discussion and/or role plays  Summary of Patient Progress:        Therapeutic Modalities:   Cognitive Behavioral Therapy Brief Therapy Feelings Identification   Greg Bernadette Gores, LCSW 

## 2017-11-22 NOTE — Progress Notes (Addendum)
[facility]  11/22/2017 8:46 AM Amanda Carrillo  MRN:  [id number]   Subjective:  Patient states that she feels better today. She talked to her fiance last night and they had a good conversation. She reports it is difficult to discuss things, because she has manic episodes too and is not treated. "I'm in a crisis and he is not helping. I am trying to keep my distance to improve my self." She reports that her mood has improved and her sleep was better last night.   Objective: Patient's chart and findings reviewed and discussed with treatment team. Patient is pleasant and cooperative. She presents in her bed again, but has been seen up in the milieu interacting appropriately. She appears brighter today. She is future oriented and has started planning ways to cope with her fiance better, but she is questioning if it is the best environment for her. Will continue current medications. Discussed Depakote level in the next couple of days.  Principal Problem: Bipolar 1 disorder, mixed, moderate (HCC) Diagnosis:   Patient Active Problem List   Diagnosis Date Noted  . Cocaine use disorder, moderate, dependence (HCC) [F14.20] 11/19/2017  . Bipolar 1 disorder, mixed, moderate (HCC) [F31.62] 11/05/2017  . MDD (major depressive disorder), severe (HCC) [F32.2] 11/04/2017  . Opioid use disorder, severe, dependence (HCC) [F11.20] 08/07/2017  . Opioid use disorder (HCC) [F11.99] 08/04/2017  . Depression [F32.9]    Total Time spent with patient: 15 minutes  Past Psychiatric History: See H&P  Past Medical History:  Past Medical History:  Diagnosis Date  . ADHD (attention deficit hyperactivity disorder)   . Anxiety   . Depression   . Gastroparesis 01/2017  . Polysubstance abuse (HCC)    herion, methadone, etoh    Past Surgical History:  Procedure Laterality Date  . CHOLECYSTECTOMY    . TUBAL LIGATION    . TUBAL LIGATION     Family History:  Family History  Problem Relation Age of Onset  .  Pneumonia Other   . Depression Mother    Family Psychiatric  History: See H&P Social History:  Social History   Substance and Sexual Activity  Alcohol Use Yes  . Alcohol/week: 4.8 oz  . Types: 8 Cans of beer per week   Comment: 8 beers every day     Social History   Substance and Sexual Activity  Drug Use Yes  . Types: IV, Cocaine   Comment: $100 daily cocaine    Social History   Socioeconomic History  . Marital status: Divorced    Spouse name: Not on file  . Number of children: Not on file  . Years of education: Not on file  . Highest education level: Not on file  Occupational History  . Not on file  Social Needs  . Financial resource strain: Not on file  . Food insecurity:    Worry: Not on file    Inability: Not on file  . Transportation needs:    Medical: Not on file    Non-medical: Not on file  Tobacco Use  . Smoking status: Current Every Day Smoker    Packs/day: 2.00    Years: 15.00    Pack years: 30.00    Types: Cigarettes  . Smokeless tobacco: Never Used  Substance and Sexual Activity  . Alcohol use: Yes    Alcohol/week: 4.8 oz    Types: 8 Cans of beer per week    Comment: 8 beers every day  . Drug use: Yes  Types: IV, Cocaine    Comment: $100 daily cocaine  . Sexual activity: Yes    Birth control/protection: Surgical  Lifestyle  . Physical activity:    Days per week: Not on file    Minutes per session: Not on file  . Stress: Not on file  Relationships  . Social connections:    Talks on phone: Not on file    Gets together: Not on file    Attends religious service: Not on file    Active member of club or organization: Not on file    Attends meetings of clubs or organizations: Not on file    Relationship status: Not on file  Other Topics Concern  . Not on file  Social History Narrative  . Not on file   Additional Social History:    Pain Medications: none Prescriptions: prozac   flonase    neurotin   vistaril   zyprexa    trazadone Over the Counter: none History of alcohol / drug use?: Yes Longest period of sobriety (when/how long): unknown Negative Consequences of Use: Financial, Personal relationships, Work / Programmer, multimedia Withdrawal Symptoms: Other (Comment)(anxiety, depression) Name of Substance 1: cocaine 1 - Age of First Use: mid 20's 1 - Amount (size/oz): $100.00 per day 1 - Frequency: daily 1 - Duration: unsure 1 - Last Use / Amount: unsure Name of Substance 2: alcohol 2 - Age of First Use: 42 yrs old 2 - Amount (size/oz): 8 beers daily 2 - Frequency: daily 2 - Duration: 20 days 2 - Last Use / Amount: 8 beers                Sleep: Good  Appetite:  Good  Current Medications: Current Facility-Administered Medications  Medication Dose Route Frequency Provider Last Rate Last Dose  . alum & mag hydroxide-simeth (MAALOX/MYLANTA) 200-200-20 MG/5ML suspension 30 mL  30 mL Oral Q4H PRN Rankin, Shuvon B, NP      . divalproex (DEPAKOTE) DR tablet 250 mg  250 mg Oral Veatrice Kells, MD   250 mg at 11/22/17 1610   And  . divalproex (DEPAKOTE) DR tablet 500 mg  500 mg Oral QHS Micheal Likens, MD   500 mg at 11/21/17 2104  . fluticasone (FLONASE) 50 MCG/ACT nasal spray 2 spray  2 spray Each Nare Daily Micheal Likens, MD   2 spray at 11/22/17 0803  . gabapentin (NEURONTIN) capsule 400 mg  400 mg Oral TID Rankin, Shuvon B, NP   400 mg at 11/22/17 0803  . hydrOXYzine (ATARAX/VISTARIL) tablet 50 mg  50 mg Oral Q6H PRN Micheal Likens, MD   50 mg at 11/21/17 2104  . ibuprofen (ADVIL,MOTRIN) tablet 600 mg  600 mg Oral Q6H PRN Micheal Likens, MD   600 mg at 11/19/17 1611  . magnesium hydroxide (MILK OF MAGNESIA) suspension 30 mL  30 mL Oral Daily PRN Rankin, Shuvon B, NP      . nicotine polacrilex (NICORETTE) gum 2 mg  2 mg Oral PRN Cobos, Rockey Situ, MD   2 mg at 11/21/17 2104  . OLANZapine (ZYPREXA) tablet 10 mg  10 mg Oral QHS Rankin, Shuvon B, NP   10  mg at 11/21/17 2104  . traZODone (DESYREL) tablet 150 mg  150 mg Oral QHS Rankin, Shuvon B, NP   150 mg at 11/21/17 2104    Lab Results: No results found for this or any previous visit (from the past 48 hour(s)).  Blood Alcohol level:  Lab Results  Component Value Date   ETH <10 11/19/2017   ETH <10 11/03/2017    Metabolic Disorder Labs: Lab Results  Component Value Date   HGBA1C 5.1 11/19/2017   MPG 99.67 11/19/2017   No results found for: PROLACTIN Lab Results  Component Value Date   CHOL 226 (H) 11/19/2017   TRIG 176 (H) 11/19/2017   HDL 79 11/19/2017   CHOLHDL 2.9 11/19/2017   VLDL 35 11/19/2017   LDLCALC 112 (H) 11/19/2017    Physical Findings: AIMS: Facial and Oral Movements Muscles of Facial Expression: None, normal Lips and Perioral Area: None, normal Jaw: None, normal Tongue: None, normal,Extremity Movements Upper (arms, wrists, hands, fingers): None, normal Lower (legs, knees, ankles, toes): None, normal, Trunk Movements Neck, shoulders, hips: None, normal, Overall Severity Severity of abnormal movements (highest score from questions above): None, normal Incapacitation due to abnormal movements: None, normal Patient's awareness of abnormal movements (rate only patient's report): No Awareness, Dental Status Current problems with teeth and/or dentures?: No Does patient usually wear dentures?: No  CIWA:  CIWA-Ar Total: 1 COWS:  COWS Total Score: 2  Musculoskeletal: Strength & Muscle Tone: within normal limits Gait & Station: normal Patient leans: N/A  Psychiatric Specialty Exam: Physical Exam  Nursing note and vitals reviewed. Constitutional: She is oriented to person, place, and time. She appears well-developed and well-nourished.  Cardiovascular: Normal rate.  Respiratory: Effort normal.  Musculoskeletal: Normal range of motion.  Neurological: She is alert and oriented to person, place, and time.  Skin: Skin is warm.    Review of Systems   Constitutional: Negative.   HENT: Negative.   Eyes: Negative.   Respiratory: Negative.   Cardiovascular: Negative.   Gastrointestinal: Negative.   Genitourinary: Negative.   Musculoskeletal: Negative.   Skin: Negative.   Neurological: Negative.   Psychiatric/Behavioral: Positive for depression and substance abuse. Negative for hallucinations and suicidal ideas. The patient is nervous/anxious.     Blood pressure 103/74, pulse 98, temperature 98 F (36.7 C), temperature source Oral, resp. rate 16, height 5' 7.75" (1.721 m), weight 90.4 kg (199 lb 4 oz), last menstrual period 10/16/2017.Body mass index is 30.52 kg/m.  General Appearance: Casual  Eye Contact:  Good  Speech:  Clear and Coherent and Normal Rate  Volume:  Normal  Mood:  Depressed  Affect:  Flat  Thought Process:  Goal Directed and Descriptions of Associations: Intact  Orientation:  Full (Time, Place, and Person)  Thought Content:  WDL  Suicidal Thoughts:  No  Homicidal Thoughts:  No  Memory:  Immediate;   Good Recent;   Good Remote;   Good  Judgement:  Fair  Insight:  Good  Psychomotor Activity:  Normal  Concentration:  Concentration: Good and Attention Span: Good  Recall:  Good  Fund of Knowledge:  Good  Language:  Good  Akathisia:  No  Handed:  Right  AIMS (if indicated):     Assets:  Communication Skills Desire for Improvement Financial Resources/Insurance Housing Physical Health Social Support Transportation  ADL's:  Intact  Cognition:  WNL  Sleep:  Number of Hours: 6.5   Problems Addressed: Bipolar I Cocaine use disorder   Treatment Plan Summary: Daily contact with patient to assess and evaluate symptoms and progress in treatment, Medication management and Plan is to:  -Continue Depakote 250 mg PO QAM and 500 mg QHS for mood stability -Continue Neurontin 400 mg TID for withdrawal symptoms -Continue Zyprexa 10 mg PO QHS for mood stability -Continue Trazodone 150 mg  QHS for mood and  sleep -Continue Vistaril 50 mg Q6H PRN for anxiety -Encourage group therapy participation  Maryfrances Bunnell, FNP 11/22/2017, 8:46 AM   Agree with NP Progress Note

## 2017-11-23 MED ORDER — GABAPENTIN 400 MG PO CAPS
400.0000 mg | ORAL_CAPSULE | Freq: Three times a day (TID) | ORAL | Status: DC
  Administered 2017-11-23 – 2017-11-26 (×12): 400 mg via ORAL
  Filled 2017-11-23: qty 84
  Filled 2017-11-23 (×3): qty 1
  Filled 2017-11-23 (×4): qty 84
  Filled 2017-11-23: qty 1
  Filled 2017-11-23: qty 84
  Filled 2017-11-23: qty 1
  Filled 2017-11-23 (×2): qty 84
  Filled 2017-11-23: qty 1
  Filled 2017-11-23: qty 84
  Filled 2017-11-23: qty 1
  Filled 2017-11-23 (×2): qty 84
  Filled 2017-11-23: qty 1
  Filled 2017-11-23: qty 84

## 2017-11-23 NOTE — [facility] Group Notes (Signed)
LCSW Group Therapy Note   11/23/2017 1:15pm   Type of Therapy and Topic:  Group Therapy:  Overcoming Obstacles   Participation Level:  Did Not Attend--pt invited. Chose to remain in bed.    Description of Group:    In this group patients will be encouraged to explore what they see as obstacles to their own wellness and recovery. They will be guided to discuss their thoughts, feelings, and behaviors related to these obstacles. The group will process together ways to cope with barriers, with attention given to specific choices patients can make. Each patient will be challenged to identify changes they are motivated to make in order to overcome their obstacles. This group will be process-oriented, with patients participating in exploration of their own experiences as well as giving and receiving support and challenge from other group members.   Therapeutic Goals: 1. Patient will identify personal and current obstacles as they relate to admission. 2. Patient will identify barriers that currently interfere with their wellness or overcoming obstacles.  3. Patient will identify feelings, thought process and behaviors related to these barriers. 4. Patient will identify two changes they are willing to make to overcome these obstacles:      Summary of Patient Progress      Therapeutic Modalities:   Cognitive Behavioral Therapy Solution Focused Therapy Motivational Interviewing Relapse Prevention Therapy  Ledell PeoplesHeather N Smart, LCSW 11/23/2017 12:16 PM

## 2017-11-23 NOTE — Progress Notes (Signed)
Recreation Therapy Notes  Date: 4.8.19 Time: 9:30 a.m. Location: [address] Dayroom   Group Topic: Stress Management   Goal Area(s) Addresses:  Goal 1.1: To reduce stress  -Patient will feel a reduction in stress level  -Patient will learn the importance of stress management  -Patient will participate during stress management group      Intervention: Stress Management   Activity: Meditation- Patients were in a peaceful environment with soft lighting enhancing patients mood. Patients listened to a body scan meditation to help decrease tension and stress levels    Education: Stress Management, Discharge Planning.    Education Outcome: Acknowledges edcuation/In group clarification offered/Needs additional education   Clinical Observations/Feedback:: Patient did not attend     Sheryle HailDarian Cruzita Lipa, Recreation Therapy Intern   Sheryle HailDarian Lucendia Leard 11/23/2017 8:27 AM

## 2017-11-23 NOTE — Progress Notes (Signed)
[facility] MD Progress Note  11/23/2017 3:25 PM Amanda Carrillo  MRN:  [id number]   Subjective:  Amanda Carrillo reports, "I'm feeling very agitated. I can't focus or concentrate on anything. This is the reason I don't go to the group sessions. I recently started on Depakote, as of today, I have not seen any improvement. I don't know how it is going to help me. Can you give something for anxiety? The Vistaril ain't working. I have always been an anxious person".  Objective:Amanda Carrillo is a 42 y/o F with history of Bipolar I and polysubstance abuse who was admitted voluntarily as a walk-in to [facility] with worsening depression, SI, recent suicide attempt via hanging, and worsening substance use. Pt has recent relevant history of discharge from [facility] on 11/09/17. She was stabilized on prozac and olanzapine, and she was referred to [facility] ([place] for follow up. Pt had indicated on initial intake that she was struggling with mood swings, agitation, depression, and had attempted to hang herself prior to coming back to [facility] for treatment. She also reports relapse on cocaine, alcohol, and     11-23-17, Amanda Carrillo is seen, chart reviewed, the chart findings discussed with the treatment team. She is lying down in her bed. She presents irritated. She is complaining of poor focus & concentration. She says her anxiety level is high. She reports sleeping well & eating well. She is not quite visible on the unit. She is not attending group sessions. She says she is not attending group sessions because she is not able to concentrate or focus on the group discussions. She is requesting more medicine for anxiety/agitation. She denies any SIHI, AVH, delusional thoughts or paranoia. She does not appear to be responding to any internal stimuli. Her Gabapentin has been increased from 400 mg tid to 400 mg qid. Amanda Carrillo. Will obtain Depakote level in the morning.  Principal Problem: Bipolar 1 disorder, mixed, moderate (HCC)  Diagnosis:    Patient Active Problem List   Diagnosis Date Noted  . Cocaine use disorder, moderate, dependence (HCC) [F14.20] 11/19/2017  . Bipolar 1 disorder, mixed, moderate (HCC) [F31.62] 11/05/2017  . MDD (major depressive disorder), severe (HCC) [F32.2] 11/04/2017  . Opioid use disorder, severe, dependence (HCC) [F11.20] 08/07/2017  . Opioid use disorder (HCC) [F11.99] 08/04/2017  . Depression [F32.9]    Total Time spent with patient: 15 minutes  Past Psychiatric History: See H&P  Past Medical History:  Past Medical History:  Diagnosis Date  . ADHD (attention deficit hyperactivity disorder)   . Anxiety   . Depression   . Gastroparesis 01/2017  . Polysubstance abuse (HCC)    herion, methadone, etoh    Past Surgical History:  Procedure Laterality Date  . CHOLECYSTECTOMY    . TUBAL LIGATION    . TUBAL LIGATION     Family History:  Family History  Problem Relation Age of Onset  . Pneumonia Other   . Depression Mother    Family Psychiatric  History: See H&P  Social History:  Social History   Substance and Sexual Activity  Alcohol Use Yes  . Alcohol/week: 4.8 oz  . Types: 8 Cans of beer per week   Comment: 8 beers every day     Social History   Substance and Sexual Activity  Drug Use Yes  . Types: IV, Cocaine   Comment: $100 daily cocaine    Social History   Socioeconomic History  . Marital status: Divorced    Spouse name: Not on  file  . Number of children: Not on file  . Years of education: Not on file  . Highest education level: Not on file  Occupational History  . Not on file  Social Needs  . Financial resource strain: Not on file  . Food insecurity:    Worry: Not on file    Inability: Not on file  . Transportation needs:    Medical: Not on file    Non-medical: Not on file  Tobacco Use  . Smoking status: Current Every Day Smoker    Packs/day: 2.00    Years: 15.00    Pack years: 30.00    Types: Cigarettes  . Smokeless tobacco: Never Used  Substance  and Sexual Activity  . Alcohol use: Yes    Alcohol/week: 4.8 oz    Types: 8 Cans of beer per week    Comment: 8 beers every day  . Drug use: Yes    Types: IV, Cocaine    Comment: $100 daily cocaine  . Sexual activity: Yes    Birth control/protection: Surgical  Lifestyle  . Physical activity:    Days per week: Not on file    Minutes per session: Not on file  . Stress: Not on file  Relationships  . Social connections:    Talks on phone: Not on file    Gets together: Not on file    Attends religious service: Not on file    Active member of club or organization: Not on file    Attends meetings of clubs or organizations: Not on file    Relationship status: Not on file  Other Topics Concern  . Not on file  Social History Narrative  . Not on file   Additional Social History:  Pain Carrillo: none Prescriptions: prozac   flonase    neurotin   vistaril   zyprexa   trazadone Over the Counter: none History of alcohol / drug use?: Yes Longest period of sobriety (when/how long): unknown Negative Consequences of Use: Financial, Personal relationships, Work / Programmer, multimediachool Withdrawal Symptoms: Other (Comment)(anxiety, depression) Name of Substance 1: cocaine 1 - Age of First Use: mid 20's 1 - Amount (size/oz): $100.00 per day 1 - Frequency: daily 1 - Duration: unsure 1 - Last Use / Amount: unsure Name of Substance 2: alcohol 2 - Age of First Use: 42 yrs old 2 - Amount (size/oz): 8 beers daily 2 - Frequency: daily 2 - Duration: 20 days 2 - Last Use / Amount: 8 beers  Sleep: Good  Appetite:  Good  Current Carrillo: Current Facility-Administered Carrillo  Medication Dose Route Frequency Provider Last Rate Last Dose  . alum & mag hydroxide-simeth (MAALOX/MYLANTA) 200-200-20 MG/5ML suspension 30 mL  30 mL Oral Q4H PRN Rankin, Shuvon B, Amanda Carrillo      . divalproex (DEPAKOTE) DR tablet 250 mg  250 mg Oral Veatrice KellsBH-q7a [place], Christopher T, MD   250 mg at 11/23/17 [id number]   And  . divalproex  (DEPAKOTE) DR tablet 500 mg  500 mg Oral QHS Micheal Likensain[place], Christopher T, MD   500 mg at 11/22/17 2100  . fluticasone (FLONASE) 50 MCG/ACT nasal spray 2 spray  2 spray Each Nare Daily Micheal Likens[place], Christopher T, MD   2 spray at 11/23/17 0801  . gabapentin (NEURONTIN) capsule 400 mg  400 mg Oral TID PC & HS Teirra Carapia I, Amanda Carrillo      . hydrOXYzine (ATARAX/VISTARIL) tablet 50 mg  50 mg Oral Q6H PRN Micheal Likens[place], Christopher T, MD   50 mg at  11/23/17 1151  . ibuprofen (ADVIL,MOTRIN) tablet 600 mg  600 mg Oral Q6H PRN Micheal Likens, MD   600 mg at 11/19/17 1611  . magnesium hydroxide (MILK OF MAGNESIA) suspension 30 mL  30 mL Oral Daily PRN Rankin, Shuvon B, Amanda Carrillo      . nicotine polacrilex (NICORETTE) gum 2 mg  2 mg Oral PRN Cobos, Rockey Situ, MD   2 mg at 11/22/17 1900  . OLANZapine (ZYPREXA) tablet 10 mg  10 mg Oral QHS Rankin, Shuvon B, Amanda Carrillo   10 mg at 11/22/17 2100  . traZODone (DESYREL) tablet 150 mg  150 mg Oral QHS Rankin, Shuvon B, Amanda Carrillo   150 mg at 11/22/17 2100   Lab Results: No results found for this or any previous visit (from the past 48 hour(s)).  Blood Alcohol level:  Lab Results  Component Value Date   ETH <10 11/19/2017   ETH <10 11/03/2017   Metabolic Disorder Labs: Lab Results  Component Value Date   HGBA1C 5.1 11/19/2017   MPG 99.67 11/19/2017   No results found for: PROLACTIN Lab Results  Component Value Date   CHOL 226 (H) 11/19/2017   TRIG 176 (H) 11/19/2017   HDL 79 11/19/2017   CHOLHDL 2.9 11/19/2017   VLDL 35 11/19/2017   LDLCALC 112 (H) 11/19/2017   Physical Findings: AIMS: Facial and Oral Movements Muscles of Facial Expression: None, normal Lips and Perioral Area: None, normal Jaw: None, normal Tongue: None, normal,Extremity Movements Upper (arms, wrists, hands, fingers): None, normal Lower (legs, knees, ankles, toes): None, normal, Trunk Movements Neck, shoulders, hips: None, normal, Overall Severity Severity of abnormal movements (highest score from  questions above): None, normal Incapacitation due to abnormal movements: None, normal Patient's awareness of abnormal movements (rate only patient's report): No Awareness, Dental Status Current problems with teeth and/or dentures?: No Does patient usually wear dentures?: No  CIWA:  CIWA-Ar Total: 1 COWS:  COWS Total Score: 2  Musculoskeletal: Strength & Muscle Tone: within normal limits Gait & Station: normal Patient leans: N/A  Psychiatric Specialty Exam: Physical Exam  Nursing note and vitals reviewed. Constitutional: She is oriented to person, place, and time. She appears well-developed and well-nourished.  Cardiovascular: Normal rate.  Respiratory: Effort normal.  Musculoskeletal: Normal range of motion.  Neurological: She is alert and oriented to person, place, and time.  Skin: Skin is warm.    Review of Systems  Constitutional: Negative.   HENT: Negative.   Eyes: Negative.   Respiratory: Negative.   Cardiovascular: Negative.   Gastrointestinal: Negative.   Genitourinary: Negative.   Musculoskeletal: Negative.   Skin: Negative.   Neurological: Negative.   Psychiatric/Behavioral: Positive for depression and substance abuse. Negative for hallucinations and suicidal ideas. The patient is nervous/anxious.     Blood pressure 103/74, pulse 98, temperature 98 F (36.7 C), temperature source Oral, resp. rate 16, height 5' 7.75" (1.721 m), weight 90.4 kg (199 lb 4 oz), last menstrual period 10/16/2017.Body mass index is 30.52 kg/m.  General Appearance: Casual  Eye Contact:  Good  Speech:  Clear and Coherent and Normal Rate  Volume:  Normal  Mood:  Depressed  Affect:  Flat  Thought Process:  Goal Directed and Descriptions of Associations: Intact  Orientation:  Full (Time, Place, and Person)  Thought Content:  WDL  Suicidal Thoughts:  No  Homicidal Thoughts:  No  Memory:  Immediate;   Good Recent;   Good Remote;   Good  Judgement:  Fair  Insight:  Good  Psychomotor  Activity:  Normal  Concentration:  Concentration: Good and Attention Span: Good  Recall:  Good  Fund of Knowledge:  Good  Language:  Good  Akathisia:  No  Handed:  Right  AIMS (if indicated):     Assets:  Communication Skills Desire for Improvement Financial Resources/Insurance Housing Physical Health Social Support Transportation  ADL's:  Intact  Cognition:  WNL  Sleep:  Number of Hours: 6.25   Problems Addressed: Bipolar I Cocaine use disorder  Treatment Plan Summary: Daily contact with patient to assess and evaluate symptoms and progress in treatment, Medication management and Plan is to: Continue inpatient hospitalization.  - Will continue today 11/23/2017 plan as below except where it is noted.  - Continue Depakote 250 mg PO QAM and 500 mg QHS for mood stabilization. - Obtain Depakote level in am (11-24-17).  - Increased Neurontin from 400 mg TID to 400 mg qid for agitation/substance withdrawal symptoms.  - Continue Zyprexa 10 mg PO QHS for mood control.  - Continue Trazodone 150 mg QHS for sleep.  - Continue Vistaril 50 mg Q6H PRN for anxiety.  - Encourage group therapy participation. - Discharge disposition ongoing.  Amanda Stammer, Amanda Carrillo, Amanda Carrillo, Amanda Carrillo 11/23/2017, 3:25 PMPatient ID: Amanda Carrillo, female   DOB: 06-30-1976, 42 y.o.   MRN: [id number]

## 2017-11-23 NOTE — Progress Notes (Signed)
Patient ID: Olena Matermie L Cockerill, female   DOB: [birth year], 42 y.o.   MRN: [id number]  Pt currently presents with a flat affect and depressed behavior. Pt reports to Clinical research associatewriter that their goal is to "talk to a Child psychotherapistsocial worker about my discharge plan." Pt reports she is unaware of whether or not she will be able to [facility] or [facility], reports she would like to know if she is waiting on a bed. Pt states "I am going to try to go to more groups, I think the Vistaril hits me too hard and I get tired." Pt also reports some ongoing mental confusion. Pt reports good sleep with current medication regimen.   Pt provided with medications per providers orders. Pt's labs and vitals were monitored throughout the night. Pt given a 1:1 about emotional and mental status. Pt supported and encouraged to express concerns and questions. Pt educated on medications and assertiveness techniques. Encouraged to attend groups tomorrow.   Pt's safety ensured with 15 minute and environmental checks. Pt currently denies SI/HI and A/V hallucinations. Pt verbally agrees to seek staff if SI/HI or A/VH occurs and to consult with staff before acting on any harmful thoughts. Will continue POC.

## 2017-11-23 NOTE — Progress Notes (Signed)
DAR NOTE: Patient presents with anxious affect and irritable mood.  Pt complained of not being seen by social work, pt stating she does not know when she is being discharged or where she is going to go after discharged. Pt became irritable, bang on the wall, phone. Wanted to her room and slammed the door. Pt then went, made a phoned call telling family member to come pick her up.  Denies pain, auditory and visual hallucinations.  Rates depression at 7, hopelessness at 7, and anxiety at 7.  Maintained on routine safety checks.  Medications given as prescribed.  Support and encouragement offered as needed.  .  States goal for today is "seeing whats up with [facility]."  Will continue to monitor.

## 2017-11-24 LAB — VALPROIC ACID LEVEL: VALPROIC ACID LVL: 61 ug/mL (ref 50.0–100.0)

## 2017-11-24 MED ORDER — NALTREXONE HCL 50 MG PO TABS
50.0000 mg | ORAL_TABLET | Freq: Every day | ORAL | Status: DC
  Administered 2017-11-24 – 2017-11-26 (×3): 50 mg via ORAL
  Filled 2017-11-24: qty 21
  Filled 2017-11-24 (×2): qty 1
  Filled 2017-11-24: qty 21
  Filled 2017-11-24 (×3): qty 1

## 2017-11-24 NOTE — Progress Notes (Signed)
Recreation Therapy Notes   Animal-Assisted Activity (AAA) Program Checklist/Progress Notes Patient Eligibility Criteria Checklist & Daily Group note for Rec Tx Intervention Date: 4.9.19. Time: 2:45 p.m.  Location: [address] Dayroom   AAA/T Program Assumption of Risk Form signed by Patient/ or Parent Legal Guardian Yes  Patient is free of allergies or sever asthma Yes  Patient reports no fear of animals Yes  Patient reports no history of cruelty to animals Yes  Patient understands his/her participation is voluntary Yes  Patient washes hands before animal contact Yes  Patient washes hands after animal contact Yes  Education: Hand Washing, Appropriate Animal Interaction   Education Outcome: Acknowledges Education.   Clinical Observations/Feedback: Patient did not attend   Raynette Arras, Recreation Therapy Intern   Marvina Danner 11/24/2017 1:54 PM 

## 2017-11-24 NOTE — Progress Notes (Signed)
DAR NOTE: Patient presents with calm affect and pleasant mood. Pt stated she is doing good today, just complained about her upset stomach, asked for ginger el. Pt has been observed in the milieu interacting well with both peers and staff. Reports good night sleep, good appetite, and poor concentration. Denies pain, auditory and visual hallucinations.  Rates depression at 8, hopelessness at 8, and anxiety at 9.  Maintained on routine safety checks.  Medications given as prescribed.  Support and encouragement offered as needed.  Attended group and participated.  States goal for today is "talk with CSW."  Patient observed socializing with peers in the dayroom.  Offered no complaint.

## 2017-11-24 NOTE — Progress Notes (Addendum)
[facility] MD Progress Note  11/24/2017 4:41 PM Amanda Carrillo  MRN:  [id number]   Subjective:  Amanda Carrillo reports, "I'm feeling a lot better today. I'm now having a lot of dreams, good dreams because I was using on one of those dreams. I can't get my mind from thinking about drugs, drugs, drugs. Now, I'm craving drugs".   Objective:Amanda Carrillo is a 42 y/o F with history of Bipolar I and polysubstance abuse who was admitted voluntarily as a walk-in to [facility] with worsening depression, SI, recent suicide attempt via hanging, and worsening substance use. Pt has recent relevant history of discharge from [facility] on 11/09/17. She was stabilized on prozac and olanzapine, and she was referred to [facility] for follow up. Pt had indicated on initial intake that she was struggling with mood swings, agitation, depression, and had attempted to hang herself prior to coming back to [facility] for treatment. She also reports relapse on cocaine, alcohol, and     11-23-17, Amanda Carrillo is seen, chart reviewed, the chart findings discussed with the treatment team. She presents with improved mood & outlook. She reports sleeping well & eating well. She is visible on the unit today, attending group sessions. She reports feeling a lot better, however, complains of cravings & says she is unable to keep her mind from thinking drug, drug, drug.  She denies any SIHI, AVH, delusional thoughts or paranoia. She does not appear to be responding to any internal stimuli. Her Gabapentin was increased from 400 mg tid to 400 mg qid. Denis any adverse effects from medications. The Depakote level is good at 61. Amanda Carrillo has agreed to start on Naltrexone 50 mg po daily for cocaine cravings.  Principal Problem: Bipolar 1 disorder, mixed, moderate (HCC)  Diagnosis:   Patient Active Problem List   Diagnosis Date Noted  . Cocaine use disorder, moderate, dependence (HCC) [F14.20] 11/19/2017  . Bipolar 1 disorder, mixed, moderate (HCC) [F31.62] 11/05/2017  . MDD (major depressive  disorder), severe (HCC) [F32.2] 11/04/2017  . Opioid use disorder, severe, dependence (HCC) [F11.20] 08/07/2017  . Opioid use disorder (HCC) [F11.99] 08/04/2017  . Depression [F32.9]    Total Time spent with patient: 15 minutes  Past Psychiatric History: See H&P  Past Medical History:  Past Medical History:  Diagnosis Date  . ADHD (attention deficit hyperactivity disorder)   . Anxiety   . Depression   . Gastroparesis 01/2017  . Polysubstance abuse (HCC)    herion, methadone, etoh    Past Surgical History:  Procedure Laterality Date  . CHOLECYSTECTOMY    . TUBAL LIGATION    . TUBAL LIGATION     Family History:  Family History  Problem Relation Age of Onset  . Pneumonia Other   . Depression Mother    Family Psychiatric  History: See H&P  Social History:  Social History   Substance and Sexual Activity  Alcohol Use Yes  . Alcohol/week: 4.8 oz  . Types: 8 Cans of beer per week   Comment: 8 beers every day     Social History   Substance and Sexual Activity  Drug Use Yes  . Types: IV, Cocaine   Comment: $100 daily cocaine    Social History   Socioeconomic History  . Marital status: Divorced    Spouse name: Not on file  . Number of children: Not on file  . Years of education: Not on file  . Highest education level: Not on file  Occupational History  . Not on file  Social  Needs  . Financial resource strain: Not on file  . Food insecurity:    Worry: Not on file    Inability: Not on file  . Transportation needs:    Medical: Not on file    Non-medical: Not on file  Tobacco Use  . Smoking status: Current Every Day Smoker    Packs/day: 2.00    Years: 15.00    Pack years: 30.00    Types: Cigarettes  . Smokeless tobacco: Never Used  Substance and Sexual Activity  . Alcohol use: Yes    Alcohol/week: 4.8 oz    Types: 8 Cans of beer per week    Comment: 8 beers every day  . Drug use: Yes    Types: IV, Cocaine    Comment: $100 daily cocaine  . Sexual  activity: Yes    Birth control/protection: Surgical  Lifestyle  . Physical activity:    Days per week: Not on file    Minutes per session: Not on file  . Stress: Not on file  Relationships  . Social connections:    Talks on phone: Not on file    Gets together: Not on file    Attends religious service: Not on file    Active member of club or organization: Not on file    Attends meetings of clubs or organizations: Not on file    Relationship status: Not on file  Other Topics Concern  . Not on file  Social History Narrative  . Not on file   Additional Social History:  Pain Medications: none Prescriptions: prozac   flonase    neurotin   vistaril   zyprexa   trazadone Over the Counter: none History of alcohol / drug use?: Yes Longest period of sobriety (when/how long): unknown Negative Consequences of Use: Financial, Personal relationships, Work / Programmer, multimedia Withdrawal Symptoms: Other (Comment)(anxiety, depression) Name of Substance 1: cocaine 1 - Age of First Use: mid 20's 1 - Amount (size/oz): $100.00 per day 1 - Frequency: daily 1 - Duration: unsure 1 - Last Use / Amount: unsure Name of Substance 2: alcohol 2 - Age of First Use: 42 yrs old 2 - Amount (size/oz): 8 beers daily 2 - Frequency: daily 2 - Duration: 20 days 2 - Last Use / Amount: 8 beers  Sleep: Good  Appetite:  Good  Current Medications: Current Facility-Administered Medications  Medication Dose Route Frequency Provider Last Rate Last Dose  . alum & mag hydroxide-simeth (MAALOX/MYLANTA) 200-200-20 MG/5ML suspension 30 mL  30 mL Oral Q4H PRN Rankin, Shuvon B, NP      . divalproex (DEPAKOTE) DR tablet 250 mg  250 mg Oral Veatrice Kells, MD   250 mg at 11/24/17 1610   And  . divalproex (DEPAKOTE) DR tablet 500 mg  500 mg Oral QHS Micheal Likens, MD   500 mg at 11/23/17 2117  . fluticasone (FLONASE) 50 MCG/ACT nasal spray 2 spray  2 spray Each Nare Daily Micheal Likens, MD   2  spray at 11/24/17 [id number]  . gabapentin (NEURONTIN) capsule 400 mg  400 mg Oral TID PC & HS Jeremian Whitby I, NP   400 mg at 11/24/17 1157  . hydrOXYzine (ATARAX/VISTARIL) tablet 50 mg  50 mg Oral Q6H PRN Micheal Likens, MD   50 mg at 11/24/17 1157  . ibuprofen (ADVIL,MOTRIN) tablet 600 mg  600 mg Oral Q6H PRN Micheal Likens, MD   600 mg at 11/19/17 1611  . magnesium hydroxide (MILK OF  MAGNESIA) suspension 30 mL  30 mL Oral Daily PRN Rankin, Shuvon B, NP      . naltrexone (DEPADE) tablet 50 mg  50 mg Oral Daily Jourden Gilson I, NP      . nicotine polacrilex (NICORETTE) gum 2 mg  2 mg Oral PRN Cobos, Rockey SituFernando A, MD   2 mg at 11/23/17 1646  . OLANZapine (ZYPREXA) tablet 10 mg  10 mg Oral QHS Rankin, Shuvon B, NP   10 mg at 11/23/17 2117  . traZODone (DESYREL) tablet 150 mg  150 mg Oral QHS Rankin, Shuvon B, NP   150 mg at 11/23/17 2117   Lab Results:  Results for orders placed or performed during the hospital encounter of 11/18/17 (from the past 48 hour(s))  Valproic acid level     Status: None   Collection Time: 11/24/17  6:37 AM  Result Value Ref Range   Valproic Acid Lvl 61 50.0 - 100.0 ug/mL    Comment: Performed at [facility], [address] [place], [place] [address]   Blood Alcohol level:  Lab Results  Component Value Date   ETH <10 11/19/2017   ETH <10 11/03/2017   Metabolic Disorder Labs: Lab Results  Component Value Date   HGBA1C 5.1 11/19/2017   MPG 99.67 11/19/2017   No results found for: PROLACTIN Lab Results  Component Value Date   CHOL 226 (H) 11/19/2017   TRIG 176 (H) 11/19/2017   HDL 79 11/19/2017   CHOLHDL 2.9 11/19/2017   VLDL 35 11/19/2017   LDLCALC 112 (H) 11/19/2017   Physical Findings: AIMS: Facial and Oral Movements Muscles of Facial Expression: None, normal Lips and Perioral Area: None, normal Jaw: None, normal Tongue: None, normal,Extremity Movements Upper (arms, wrists, hands, fingers): None, normal Lower  (legs, knees, ankles, toes): None, normal, Trunk Movements Neck, shoulders, hips: None, normal, Overall Severity Severity of abnormal movements (highest score from questions above): None, normal Incapacitation due to abnormal movements: None, normal Patient's awareness of abnormal movements (rate only patient's report): No Awareness, Dental Status Current problems with teeth and/or dentures?: No Does patient usually wear dentures?: No  CIWA:  CIWA-Ar Total: 1 COWS:  COWS Total Score: 2  Musculoskeletal: Strength & Muscle Tone: within normal limits Gait & Station: normal Patient leans: N/A  Psychiatric Specialty Exam: Physical Exam  Nursing note and vitals reviewed. Constitutional: She is oriented to person, place, and time. She appears well-developed and well-nourished.  Cardiovascular: Normal rate.  Respiratory: Effort normal.  Musculoskeletal: Normal range of motion.  Neurological: She is alert and oriented to person, place, and time.  Skin: Skin is warm.    Review of Systems  Constitutional: Negative.   HENT: Negative.   Eyes: Negative.   Respiratory: Negative.   Cardiovascular: Negative.   Gastrointestinal: Negative.   Genitourinary: Negative.   Musculoskeletal: Negative.   Skin: Negative.   Neurological: Negative.   Psychiatric/Behavioral: Positive for depression and substance abuse. Negative for hallucinations and suicidal ideas. The patient is nervous/anxious.     Blood pressure 106/83, pulse (!) 106, temperature 97.6 F (36.4 C), temperature source Oral, resp. rate 16, height 5' 7.75" (1.721 m), weight 90.4 kg (199 lb 4 oz), last menstrual period 10/16/2017.Body mass index is 30.52 kg/m.  General Appearance: Casual  Eye Contact:  Good  Speech:  Clear and Coherent and Normal Rate  Volume:  Normal  Mood:  Depressed  Affect:  Flat  Thought Process:  Goal Directed and Descriptions of Associations: Intact  Orientation:  Full (Time, Place, and Person)  Thought  Content:  WDL  Suicidal Thoughts:  No  Homicidal Thoughts:  No  Memory:  Immediate;   Good Recent;   Good Remote;   Good  Judgement:  Fair  Insight:  Good  Psychomotor Activity:  Normal  Concentration:  Concentration: Good and Attention Span: Good  Recall:  Good  Fund of Knowledge:  Good  Language:  Good  Akathisia:  No  Handed:  Right  AIMS (if indicated):     Assets:  Communication Skills Desire for Improvement Financial Resources/Insurance Housing Physical Health Social Support Transportation  ADL's:  Intact  Cognition:  WNL  Sleep:  Number of Hours: 6.75   Problems Addressed: Bipolar I Cocaine use disorder  Treatment Plan Summary: Daily contact with patient to assess and evaluate symptoms and progress in treatment, Medication management and Plan is to: Continue inpatient hospitalization.  - Will continue today 11/24/2017 plan as below except where it is noted.  - Continue Depakote 250 mg PO QAM and 500 mg QHS for mood stabilization. - Depakote level in am of (11-24-17), result in, reviewed, 61 WML.Marland Kitchen  - Continue Neurontin 400 mg qid for agitation/substance withdrawal symptoms.  - Initiated Naltrexone 50 mg po daily for cocaine cravings.  - Continue Zyprexa 10 mg PO QHS for mood control.  - Continue Trazodone 150 mg QHS for sleep.  - Continue Vistaril 50 mg Q6H PRN for anxiety.  - Encourage group therapy participation. - Discharge disposition ongoing.  Amanda Stammer, NP, pmhnp, fnp-bc 11/24/2017, 4:41 PMPatient ID: Amanda Carrillo, female   DOB: February 10, 1976, 42 y.o.   MRN: [id number]

## 2017-11-24 NOTE — [facility] Group Notes (Signed)
[facility] Group Therapy      11/24/2017 1:44 PM  Type of Therapy: [facility] Presentation  Participation Level: DID NOT ATTEND  Participation Quality:   Affect:   Cognitive:   Insight:  Engagement in Therapy:   Modes of Intervention:  Summary of Progress/Problems:   Invited, chose not to attend.   Kaiden Dardis LCSWA Clinical Social Worker    

## 2017-11-25 DIAGNOSIS — T1491XA Suicide attempt, initial encounter: Secondary | ICD-10-CM

## 2017-11-25 DIAGNOSIS — F1910 Other psychoactive substance abuse, uncomplicated: Secondary | ICD-10-CM

## 2017-11-25 DIAGNOSIS — X838XXA Intentional self-harm by other specified means, initial encounter: Secondary | ICD-10-CM

## 2017-11-25 DIAGNOSIS — R451 Restlessness and agitation: Secondary | ICD-10-CM

## 2017-11-25 MED ORDER — NICOTINE 21 MG/24HR TD PT24
21.0000 mg | MEDICATED_PATCH | Freq: Every day | TRANSDERMAL | Status: DC
  Administered 2017-11-25 – 2017-11-26 (×2): 21 mg via TRANSDERMAL
  Filled 2017-11-25 (×4): qty 1

## 2017-11-25 MED ORDER — MIRTAZAPINE 15 MG PO TABS
15.0000 mg | ORAL_TABLET | Freq: Every day | ORAL | Status: DC
  Administered 2017-11-25: 15 mg via ORAL
  Filled 2017-11-25: qty 1
  Filled 2017-11-25: qty 21
  Filled 2017-11-25: qty 1
  Filled 2017-11-25: qty 21

## 2017-11-25 NOTE — [facility] Group Notes (Signed)
LCSW Group Therapy Note 11/25/2017 3:22 PM  Type of Therapy/Topic: Group Therapy: Emotion Regulation  Participation Level: Active   Description of Group:  The purpose of this group is to assist patients in learning to regulate negative emotions and experience positive emotions. Patients will be guided to discuss ways in which they have been vulnerable to their negative emotions. These vulnerabilities will be juxtaposed with experiences of positive emotions or situations, and patients will be challenged to use positive emotions to combat negative ones. Special emphasis will be placed on coping with negative emotions in conflict situations, and patients will process healthy conflict resolution skills.  Therapeutic Goals: 1. Patient will identify two positive emotions or experiences to reflect on in order to balance out negative emotions 2. Patient will label two or more emotions that they find the most difficult to experience 3. Patient will demonstrate positive conflict resolution skills through discussion and/or role plays  Summary of Patient Progress:  Amanda Carrillo was engaged and participated throughout the group session. Amanda Carrillo reports that emotional regulation for her is "controlling my anger". Amanda Carrillo reports that she often becomes angry and react in negative ways. Amanda Carrillo states that she in return feels shameful after she has acted out in rage. Amanda Carrillo states that when she becomes angry or upset she notices that her heart begins racing. Amanda Carrillo states that she wants to learn more coping skills and anger management skills in order to regulate her emotions moving forward.    Therapeutic Modalities:  Cognitive Behavioral Therapy Feelings Identification Dialectical Behavioral Therapy   Alcario DroughtJolan Susana Gripp LCSWA Clinical Social Worker

## 2017-11-25 NOTE — Plan of Care (Signed)
  Problem: Education: Goal: Emotional status will improve Outcome: Progressing   Problem: Activity: Goal: Interest or engagement in activities will improve Outcome: Progressing   

## 2017-11-25 NOTE — Progress Notes (Signed)
[facility] MD Progress Note  11/25/2017 11:17 AM Amanda Carrillo  MRN:  [id number] Subjective:    Amanda Carrillo is a 42 y/o F with history of Bipolar I and polysubstance abuse who was admitted voluntarily as a walk-in to [facility] with worsening depression, SI, recent suicide attempt via hanging, and worsening substance use. Pt has recent relevant history of discharge from [facility] on 11/09/17. She was stabilized on prozac and olanzapine, and she was referred to [facility] for follow up. Pt had indicated on initial intake that she was struggling with mood swings, agitation, depression, and had attempted to hang herself prior to coming back to [facility] for treatment. She also reports relapse on cocaine, alcohol, and methamphetamine. Pt was started on depakote and dose was titrated up during her stay, and her level was drawn on 11/24/17 and was within therapeutic window. She was also started on olanzapine. Yesterday, she was started on naltrexone to address cravings for cocaine. She has been working with SW team to secure referral to [facility] for substance use treatment.  Today upon evaluation, pt shares, "Things are going good. Still having some cravings. I still get a little agitated at times, but it's better." Pt denies physical complaints. She is sleeping well. Her appetite is good. She denies SI/HI/AH/VH. She reports that her mood is still somewhat depressed and she requests to be started on an antidepressant. Counseled pt on use of antidepressant in context of bipolar disorder, and pt was in agreement of trial of remeron. She remains in agreement to referral to [facility] for substance use treatment, but at this time they have no openings. Pt had no further questions, comments, or concerns.  Principal Problem: Bipolar 1 disorder, mixed, moderate (HCC) Diagnosis:   Patient Active Problem List   Diagnosis Date Noted  . Cocaine use disorder, moderate, dependence (HCC) [F14.20] 11/19/2017  . Bipolar 1 disorder, mixed, moderate (HCC) [F31.62]  11/05/2017  . MDD (major depressive disorder), severe (HCC) [F32.2] 11/04/2017  . Opioid use disorder, severe, dependence (HCC) [F11.20] 08/07/2017  . Opioid use disorder (HCC) [F11.99] 08/04/2017  . Depression [F32.9]    Total Time spent with patient: 30 minutes  Past Psychiatric History: see H&P  Past Medical History:  Past Medical History:  Diagnosis Date  . ADHD (attention deficit hyperactivity disorder)   . Anxiety   . Depression   . Gastroparesis 01/2017  . Polysubstance abuse (HCC)    herion, methadone, etoh    Past Surgical History:  Procedure Laterality Date  . CHOLECYSTECTOMY    . TUBAL LIGATION    . TUBAL LIGATION     Family History:  Family History  Problem Relation Age of Onset  . Pneumonia Other   . Depression Mother    Family Psychiatric  History: see H&P Social History:  Social History   Substance and Sexual Activity  Alcohol Use Yes  . Alcohol/week: 4.8 oz  . Types: 8 Cans of beer per week   Comment: 8 beers every day     Social History   Substance and Sexual Activity  Drug Use Yes  . Types: IV, Cocaine   Comment: $100 daily cocaine    Social History   Socioeconomic History  . Marital status: Divorced    Spouse name: Not on file  . Number of children: Not on file  . Years of education: Not on file  . Highest education level: Not on file  Occupational History  . Not on file  Social Needs  . Financial resource strain:  Not on file  . Food insecurity:    Worry: Not on file    Inability: Not on file  . Transportation needs:    Medical: Not on file    Non-medical: Not on file  Tobacco Use  . Smoking status: Current Every Day Smoker    Packs/day: 2.00    Years: 15.00    Pack years: 30.00    Types: Cigarettes  . Smokeless tobacco: Never Used  Substance and Sexual Activity  . Alcohol use: Yes    Alcohol/week: 4.8 oz    Types: 8 Cans of beer per week    Comment: 8 beers every day  . Drug use: Yes    Types: IV, Cocaine    Comment:  $100 daily cocaine  . Sexual activity: Yes    Birth control/protection: Surgical  Lifestyle  . Physical activity:    Days per week: Not on file    Minutes per session: Not on file  . Stress: Not on file  Relationships  . Social connections:    Talks on phone: Not on file    Gets together: Not on file    Attends religious service: Not on file    Active member of club or organization: Not on file    Attends meetings of clubs or organizations: Not on file    Relationship status: Not on file  Other Topics Concern  . Not on file  Social History Narrative  . Not on file   Additional Social History:    Pain Medications: none Prescriptions: prozac   flonase    neurotin   vistaril   zyprexa   trazadone Over the Counter: none History of alcohol / drug use?: Yes Longest period of sobriety (when/how long): unknown Negative Consequences of Use: Financial, Personal relationships, Work / Programmer, multimediachool Withdrawal Symptoms: Other (Comment)(anxiety, depression) Name of Substance 1: cocaine 1 - Age of First Use: mid 20's 1 - Amount (size/oz): $100.00 per day 1 - Frequency: daily 1 - Duration: unsure 1 - Last Use / Amount: unsure Name of Substance 2: alcohol 2 - Age of First Use: 42 yrs old 2 - Amount (size/oz): 8 beers daily 2 - Frequency: daily 2 - Duration: 20 days 2 - Last Use / Amount: 8 beers                Sleep: Good  Appetite:  Good  Current Medications: Current Facility-Administered Medications  Medication Dose Route Frequency Provider Last Rate Last Dose  . alum & mag hydroxide-simeth (MAALOX/MYLANTA) 200-200-20 MG/5ML suspension 30 mL  30 mL Oral Q4H PRN Rankin, Shuvon B, NP      . divalproex (DEPAKOTE) DR tablet 250 mg  250 mg Oral Billie LadeBH-q7a Okechukwu Regnier T, MD   250 mg at 11/25/17 [id number]   And  . divalproex (DEPAKOTE) DR tablet 500 mg  500 mg Oral QHS Micheal Likensainville, Challen Spainhour T, MD   500 mg at 11/24/17 2136  . fluticasone (FLONASE) 50 MCG/ACT nasal spray 2 spray  2  spray Each Nare Daily Micheal Likensainville, Shamari Lofquist T, MD   2 spray at 11/25/17 0755  . gabapentin (NEURONTIN) capsule 400 mg  400 mg Oral TID PC & HS Nwoko, Agnes I, NP   400 mg at 11/25/17 0755  . hydrOXYzine (ATARAX/VISTARIL) tablet 50 mg  50 mg Oral Q6H PRN Micheal Likensainville, Chaitra Mast T, MD   50 mg at 11/24/17 2136  . ibuprofen (ADVIL,MOTRIN) tablet 600 mg  600 mg Oral Q6H PRN Micheal Likensainville, Terrisa Curfman T, MD   600  mg at 11/19/17 1611  . magnesium hydroxide (MILK OF MAGNESIA) suspension 30 mL  30 mL Oral Daily PRN Rankin, Shuvon B, NP      . mirtazapine (REMERON) tablet 15 mg  15 mg Oral QHS Jolyne Loa T, MD      . naltrexone (DEPADE) tablet 50 mg  50 mg Oral Daily Armandina Stammer I, NP   50 mg at 11/25/17 0811  . nicotine polacrilex (NICORETTE) gum 2 mg  2 mg Oral PRN Cobos, Rockey Situ, MD   2 mg at 11/25/17 0957  . OLANZapine (ZYPREXA) tablet 10 mg  10 mg Oral QHS Rankin, Shuvon B, NP   10 mg at 11/24/17 2136  . traZODone (DESYREL) tablet 150 mg  150 mg Oral QHS Rankin, Shuvon B, NP   150 mg at 11/24/17 2136    Lab Results:  Results for orders placed or performed during the hospital encounter of 11/18/17 (from the past 48 hour(s))  Valproic acid level     Status: None   Collection Time: 11/24/17  6:37 AM  Result Value Ref Range   Valproic Acid Lvl 61 50.0 - 100.0 ug/mL    Comment: Performed at [facility], [address] [place], [place] [address]    Blood Alcohol level:  Lab Results  Component Value Date   ETH <10 11/19/2017   ETH <10 11/03/2017    Metabolic Disorder Labs: Lab Results  Component Value Date   HGBA1C 5.1 11/19/2017   MPG 99.67 11/19/2017   No results found for: PROLACTIN Lab Results  Component Value Date   CHOL 226 (H) 11/19/2017   TRIG 176 (H) 11/19/2017   HDL 79 11/19/2017   CHOLHDL 2.9 11/19/2017   VLDL 35 11/19/2017   LDLCALC 112 (H) 11/19/2017    Physical Findings: AIMS: Facial and Oral Movements Muscles of Facial Expression:  None, normal Lips and Perioral Area: None, normal Jaw: None, normal Tongue: None, normal,Extremity Movements Upper (arms, wrists, hands, fingers): None, normal Lower (legs, knees, ankles, toes): None, normal, Trunk Movements Neck, shoulders, hips: None, normal, Overall Severity Severity of abnormal movements (highest score from questions above): None, normal Incapacitation due to abnormal movements: None, normal Patient's awareness of abnormal movements (rate only patient's report): No Awareness, Dental Status Current problems with teeth and/or dentures?: No Does patient usually wear dentures?: No  CIWA:  CIWA-Ar Total: 1 COWS:  COWS Total Score: 2  Musculoskeletal: Strength & Muscle Tone: within normal limits Gait & Station: normal Patient leans: N/A  Psychiatric Specialty Exam: Physical Exam  Nursing note and vitals reviewed.   Review of Systems  Constitutional: Negative for chills and fever.  Respiratory: Negative for cough and shortness of breath.   Cardiovascular: Negative for chest pain.  Gastrointestinal: Negative for abdominal pain, heartburn, nausea and vomiting.  Psychiatric/Behavioral: Positive for depression. Negative for hallucinations and suicidal ideas. The patient is not nervous/anxious and does not have insomnia.     Blood pressure 106/86, pulse 96, temperature 98.2 F (36.8 C), resp. rate 18, height 5' 7.75" (1.721 m), weight 90.4 kg (199 lb 4 oz), last menstrual period 10/16/2017.Body mass index is 30.52 kg/m.  General Appearance: Casual and Fairly Groomed  Eye Contact:  Good  Speech:  Clear and Coherent and Normal Rate  Volume:  Normal  Mood:  Anxious and Depressed  Affect:  Appropriate, Congruent and Constricted  Thought Process:  Coherent and Goal Directed  Orientation:  Full (Time, Place, and Person)  Thought Content:  Logical  Suicidal  Thoughts:  No  Homicidal Thoughts:  No  Memory:  Immediate;   Fair Recent;   Fair Remote;   Fair  Judgement:   Fair  Insight:  Lacking  Psychomotor Activity:  Normal  Concentration:  Concentration: Fair  Recall:  [employer] of Knowledge:  Fair  Language:  Fair  Akathisia:  No  Handed:    AIMS (if indicated):     Assets:  Communication Skills Resilience Social Support  ADL's:  Intact  Cognition:  WNL  Sleep:  Number of Hours: 6.75   Treatment Plan Summary: Daily contact with patient to assess and evaluate symptoms and progress in treatment and Medication management   - Continue inpatient hospitalization  -Bipolar I, current episode mixed  - Continue Depakote 250 mg PO QAM and 500 mg QHS for mood stabilization.   - Depakote level in am of (11-24-17), result in, reviewed, 61 WML.Marland Kitchen   - Continue Zyprexa 10 mg PO QHS for mood control.   -Start remeron 15mg  po qhs  -Anxiety/agitation   - Continue Neurontin 400 mg qid for agitation/substance withdrawal symptoms.   - Continue Vistaril 50 mg Q6H PRN for anxiety  -Polysubstance abuse   - Continue  Naltrexone 50 mg po daily for cocaine cravings.  -Insomnia   - Continue Trazodone 150 mg QHS for sleep.  - Encourage group therapy participation. - Discharge disposition ongoing.  Micheal Likens, MD 11/25/2017, 11:17 AM

## 2017-11-25 NOTE — Progress Notes (Signed)
Pt presents with a flat affect and anxious mood. Pt rates depression 8/10. Anxiety 8/10. Hopelessness 8/10. Pt reported good sleep last night. Pt denies SI/HI. Pt reports withdrawal symptoms of agitation, cravings and irritability. Pt stated goal is to speak with CSW about a bed a [facility]. Orders reviewed with pt. Verbal support provided. Pt encouraged to attend groups. 15 minute checks performed for safety. Pt compliant with tx.

## 2017-11-25 NOTE — [facility] Group Notes (Signed)
Adult Psychoeducational Group Note  Date:  11/25/2017 Time:  10:51 AM  Group Topic/Focus:  Personal Choices and Values:   The focus of this group is to help patients assess and explore the importance of values in their lives, how their values affect their decisions, how they express their values and what opposes their expression.  Participation Level:  Active  Participation Quality:  Appropriate  Affect:  Appropriate  Cognitive:  Appropriate  Insight: Lacking  Engagement in Group:  Off Topic  Modes of Intervention:  Clarification, Problem-solving and Reality Testing  Additional Comments:  Pt. Discussed triggers and having difficulty with impulse control. Pt. Was very unfocused and off topic.   Amanda BeersRodney S Burley Carrillo 11/25/2017, 10:51 AM

## 2017-11-25 NOTE — Progress Notes (Signed)
[facility] Group Notes:  (Nursing/MHT/Case Management/Adjunct)  Date:  11/25/2017  Time:  1600 Type of Therapy:  Nurse Education/Recovery   Participation Level:  Active  Participation Quality:  Appropriate  Affect:  Appropriate  Cognitive:  Appropriate  Insight:  Appropriate  Engagement in Group:  Engaged  Modes of Intervention:  Discussion and Education  Summary of Progress/Problems:  [name]  

## 2017-11-25 NOTE — Tx Team (Cosign Needed)
Interdisciplinary Treatment and Diagnostic Plan Update  11/25/2017 Time of Session: 0830AM Amanda Carrillo MRN: [id number]  Principal Diagnosis: Bipolar 1 disorder, mixed, moderate (HCC)  Secondary Diagnoses: Principal Problem:   Bipolar 1 disorder, mixed, moderate (HCC) Active Problems:   Cocaine use disorder, moderate, dependence (HCC)   Current Medications:  Current Facility-Administered Medications  Medication Dose Route Frequency Provider Last Rate Last Dose  . alum & mag hydroxide-simeth (MAALOX/MYLANTA) 200-200-20 MG/5ML suspension 30 mL  30 mL Oral Q4H PRN Rankin, Shuvon B, NP      . divalproex (DEPAKOTE) DR tablet 250 mg  250 mg Oral Billie Lade T, MD   250 mg at 11/25/17 4098   And  . divalproex (DEPAKOTE) DR tablet 500 mg  500 mg Oral QHS Micheal Likens, MD   500 mg at 11/24/17 2136  . fluticasone (FLONASE) 50 MCG/ACT nasal spray 2 spray  2 spray Each Nare Daily Micheal Likens, MD   2 spray at 11/25/17 0755  . gabapentin (NEURONTIN) capsule 400 mg  400 mg Oral TID PC & HS Nwoko, Agnes I, NP   400 mg at 11/25/17 1203  . hydrOXYzine (ATARAX/VISTARIL) tablet 50 mg  50 mg Oral Q6H PRN Micheal Likens, MD   50 mg at 11/24/17 2136  . ibuprofen (ADVIL,MOTRIN) tablet 600 mg  600 mg Oral Q6H PRN Micheal Likens, MD   600 mg at 11/19/17 1611  . magnesium hydroxide (MILK OF MAGNESIA) suspension 30 mL  30 mL Oral Daily PRN Rankin, Shuvon B, NP      . mirtazapine (REMERON) tablet 15 mg  15 mg Oral QHS Jolyne Loa T, MD      . naltrexone (DEPADE) tablet 50 mg  50 mg Oral Daily Armandina Stammer I, NP   50 mg at 11/25/17 0811  . nicotine (NICODERM CQ - dosed in mg/24 hours) patch 21 mg  21 mg Transdermal Daily Micheal Likens, MD      . OLANZapine [facility]) tablet 10 mg  10 mg Oral QHS Rankin, Shuvon B, NP   10 mg at 11/24/17 2136  . traZODone (DESYREL) tablet 150 mg  150 mg Oral QHS Rankin, Shuvon B, NP   150 mg at  11/24/17 2136   PTA Medications: Medications Prior to Admission  Medication Sig Dispense Refill Last Dose  . FLUoxetine (PROZAC) 20 MG capsule Take 1 capsule (20 mg total) by mouth daily. For depression 30 capsule 0 11/18/2017  . fluticasone (FLONASE) 50 MCG/ACT nasal spray Place 1 spray into both nostrils daily. For allergies 1 g 0 11/17/2017  . gabapentin (NEURONTIN) 400 MG capsule Take 1 capsule (400 mg total) by mouth 3 (three) times daily. For agitation 90 capsule 0 11/18/2017  . hydrOXYzine (ATARAX/VISTARIL) 25 MG tablet Take 1 tablet (25 mg total) by mouth every 6 (six) hours as needed for anxiety (Sleep). 75 tablet 0 11/18/2017  . ibuprofen (ADVIL,MOTRIN) 200 MG tablet Take 600 mg by mouth every 6 (six) hours as needed for headache.   11/16/2017  . OLANZapine (ZYPREXA) 10 MG tablet Take 1 tablet (10 mg total) by mouth at bedtime. For mood control 30 tablet 0 11/17/2017  . traZODone (DESYREL) 150 MG tablet Take 1 tablet (150 mg total) by mouth at bedtime. For sleep 30 tablet 0 11/17/2017    Patient Stressors: Financial difficulties Medication change or noncompliance Occupational concerns Substance abuse  Patient Strengths: Ability for insight Average or above average intelligence Wellsite geologist fund of knowledge Motivation for  treatment/growth Supportive family/friends  Treatment Modalities: Medication Management, Group therapy, Case management,  1 to 1 session with clinician, Psychoeducation, Recreational therapy.   Physician Treatment Plan for Primary Diagnosis: Bipolar 1 disorder, mixed, moderate (HCC) Long Term Goal(s): Improvement in symptoms so as ready for discharge Improvement in symptoms so as ready for discharge   Short Term Goals: Ability to identify triggers associated with substance abuse/mental health issues will improve Ability to demonstrate self-control will improve  Medication Management: Evaluate patient's response, side effects, and tolerance of  medication regimen.  Therapeutic Interventions: 1 to 1 sessions, Unit Group sessions and Medication administration.  Evaluation of Outcomes: Progressing  Physician Treatment Plan for Secondary Diagnosis: Principal Problem:   Bipolar 1 disorder, mixed, moderate (HCC) Active Problems:   Cocaine use disorder, moderate, dependence (HCC)  Long Term Goal(s): Improvement in symptoms so as ready for discharge Improvement in symptoms so as ready for discharge   Short Term Goals: Ability to identify triggers associated with substance abuse/mental health issues will improve Ability to demonstrate self-control will improve     Medication Management: Evaluate patient's response, side effects, and tolerance of medication regimen.  Therapeutic Interventions: 1 to 1 sessions, Unit Group sessions and Medication administration.  Evaluation of Outcomes: Progressing   RN Treatment Plan for Primary Diagnosis: Bipolar 1 disorder, mixed, moderate (HCC) Long Term Goal(s): Knowledge of disease and therapeutic regimen to maintain health will improve  Short Term Goals: Ability to remain free from injury will improve, Ability to identify and develop effective coping behaviors will improve and Compliance with prescribed medications will improve  Medication Management: RN will administer medications as ordered by provider, will assess and evaluate patient's response and provide education to patient for prescribed medication. RN will report any adverse and/or side effects to prescribing provider.  Therapeutic Interventions: 1 on 1 counseling sessions, Psychoeducation, Medication administration, Evaluate responses to treatment, Monitor vital signs and CBGs as ordered, Perform/monitor CIWA, COWS, AIMS and Fall Risk screenings as ordered, Perform wound care treatments as ordered.  Evaluation of Outcomes: Progressing   LCSW Treatment Plan for Primary Diagnosis: Bipolar 1 disorder, mixed, moderate (HCC) Long Term  Goal(s): Safe transition to appropriate next level of care at discharge, Engage patient in therapeutic group addressing interpersonal concerns.  Short Term Goals: Engage patient in aftercare planning with referrals and resources, Facilitate patient progression through stages of change regarding substance use diagnoses and concerns and Identify triggers associated with mental health/substance abuse issues  Therapeutic Interventions: Assess for all discharge needs, 1 to 1 time with Social worker, Explore available resources and support systems, Assess for adequacy in community support network, Educate family and significant other(s) on suicide prevention, Complete Psychosocial Assessment, Interpersonal group therapy.  Evaluation of Outcomes: Progressing   Progress in Treatment: Attending groups: Yes. Participating in groups: Yes. Taking medication as prescribed: Yes. Toleration medication: Yes. Family/Significant other contact made: Yes, individual(s) contacted:  pt's fiance. Collateral contact obtained and SPE completed.  Patient understands diagnosis: Yes. Discussing patient identified problems/goals with staff: Yes. Medical problems stabilized or resolved: Yes. Denies suicidal/homicidal ideation: Yes. Issues/concerns per patient self-inventory: No. Other: n/a   New problem(s) identified: No, Describe:  n/a  New Short Term/Long Term Goal(s): detox, medication management for mood stabilization; elimination of SI thoughts; development of comprehensive mental wellness/sobriety plan.   Patient Goal: "To get sober and get into treatment."   Discharge Plan or Barriers: CSW assessing for appropriate referrals. Pt is interested in [facility] referral and has not been following up with outpatient  providers since her discharge. MHAG pamphlet and AA/NA information provided for additional community support.   Reason for Continuation of Hospitalization: Anxiety Depression Medication  stabilization Withdrawal symptoms  Estimated Length of Stay: Tuesday, 11/24/17  Attendees: Patient: Amanda Carrillo 11/25/2017 12:41 PM  Physician: Dr. Altamese Carolinaainville MD; Dr. Jama Flavorsobos MD; Dr. Jola Babinskilary, MD 11/25/2017 12:41 PM  Nursing: Erskine SquibbJane RN; Lincoln Maxinlivette RN; Liborio NixonPatrice White, RN 11/25/2017 12:41 PM  RN Care Manager:x 11/25/2017 12:41 PM  Social Worker: Trula SladeHeather Smart, LCSW; Baldo DaubJolan Lemar Bakos, LCSWA 11/25/2017 12:41 PM  Recreational Therapist: x 11/25/2017 12:41 PM  Other: Feliz Beamravis Money NP 11/25/2017 12:41 PM  Other:  11/25/2017 12:41 PM  Other: 11/25/2017 12:41 PM    Scribe for Treatment Team: Maeola SarahJolan E Lera Gaines, LCSWA 11/25/2017 12:41 PM

## 2017-11-25 NOTE — Progress Notes (Signed)
Recreation Therapy Notes  Date: 4.10.19 Time: 9:30 a.m. Location: [address] Dayroom   Group Topic: Stress Management   Goal Area(s) Addresses:  Goal 1.1: To reduce stress  -Patient will feel a reduction in stress level  -Patient will learn the importance of stress management  -Patient will participate during stress management group   Behavioral Response: Engaged   Intervention: Stress Management   Activity: Meditation- Patients were in a peaceful environment with soft lighting enhancing patients mood. Patients listened to a deep concentration meditation to help decrease stress levels   Education: Stress Management, Discharge Planning.    Education Outcome: Acknowledges edcuation/In group clarification offered/Needs additional education   Clinical Observations/Feedback:: Patient attended and participated appropriately during stress management group treatment.    Sheryle Hailarian Tearah Saulsbury, Recreation Therapy Intern   Sheryle HailDarian Tymara Saur 11/25/2017 8:33 AM

## 2017-11-25 NOTE — Progress Notes (Signed)
Pt reports she is doing ok this evening, but is feeling somewhat anxious as it is about time for her to discharge and she started a new med today to help with her cravings.  She is concerned about whether she can refrain from relapsing after she is discharged.  She tells Clinical research associatewriter [facility]  She is compliant with her meds on the unit.  She voiced no other needs or concerns.  Pt is cooperative and appropriate with staff.  Support and encouragement offered.  Discharge plans are in process.  Safety maintained with q15 minute checks.

## 2017-11-26 MED ORDER — DIVALPROEX SODIUM 250 MG PO DR TAB: DELAYED_RELEASE_TABLET | ORAL | 0 refills | Status: DC

## 2017-11-26 MED ORDER — NALTREXONE HCL 50 MG PO TABS: 50.0000 mg | ORAL_TABLET | Freq: Every day | ORAL | 0 refills | Status: DC

## 2017-11-26 MED ORDER — TRAZODONE HCL 150 MG PO TABS: 150.0000 mg | ORAL_TABLET | Freq: Every day | ORAL | 0 refills | Status: DC

## 2017-11-26 MED ORDER — OLANZAPINE 10 MG PO TABS: 10.0000 mg | ORAL_TABLET | Freq: Every day | ORAL | 0 refills | Status: DC

## 2017-11-26 MED ORDER — FLUTICASONE PROPIONATE 50 MCG/ACT NA SUSP: 2.0000 | Freq: Every day | NASAL | 0 refills | Status: DC

## 2017-11-26 MED ORDER — GABAPENTIN 400 MG PO CAPS: 400.0000 mg | ORAL_CAPSULE | Freq: Three times a day (TID) | ORAL | 0 refills | Status: DC

## 2017-11-26 MED ORDER — HYDROXYZINE HCL 50 MG PO TABS: 50.0000 mg | ORAL_TABLET | Freq: Four times a day (QID) | ORAL | 0 refills | Status: DC | PRN

## 2017-11-26 MED ORDER — MIRTAZAPINE 15 MG PO TABS: 15.0000 mg | ORAL_TABLET | Freq: Every day | ORAL | 0 refills | Status: DC

## 2017-11-26 MED ORDER — FLUOXETINE HCL 20 MG PO CAPS: 20.0000 mg | ORAL_CAPSULE | Freq: Every day | ORAL | 0 refills | Status: DC

## 2017-11-26 MED ORDER — NICOTINE 21 MG/24HR TD PT24: 21.0000 mg | MEDICATED_PATCH | Freq: Every day | TRANSDERMAL | 0 refills | Status: DC

## 2017-11-26 NOTE — Progress Notes (Signed)
Pt reports she is doing better and is probably going to discharge tomorrow.  She says she is still having some cravings, but hopes the medicine "kicks in" so that she will not relapse.  She [facility] that [facility].  She denies SI/HI/AVH.  She makes her needs known to staff.  Support and encouragement offered.  Discharge plans are in process.  Safety maintained with q15 minute checks.

## 2017-11-26 NOTE — Progress Notes (Signed)
  [facility] Discharge Plan :  Will you be returning to the same living situation after discharge:  Yes,  with her fiance' until [facility] which is on 12/01/16 At discharge, do you have transportation home?: Yes,  patient reports her fiance' will pick her up at discharge Do you have the ability to pay for your medications: No.  Release of information consent forms completed and in the chart;  Patient's signature needed at discharge.  Patient to Follow up at: Follow-up Information    [facility], [facility] Follow up.   Why:  Appointment is 12/01/17 at 8:00am. Please bring photo ID/proof of [place], medication supply provided by hosptital, and clothing (no leggings). Thank you.  Contact information: [address] [place] [address] [phone]        [facility] Follow up.   Specialty:  Behavioral Health Why:  Walk in hours are Monday-Friday 8:00am-4:30pm. Bring your ID and hospital d/c paperwork. Make sure to let them know you need help getting your prescriptions filled so that you have at least a 2 week supply when [facility].  Contact information: [address] [place] [address] [phone]           Next level of care provider has access to [facility] Link:yes  Safety Planning and Suicide Prevention discussed: Yes,  with the patient's fiance'  Have you used any form of tobacco in the last 30 days? (Cigarettes, Smokeless Tobacco, Cigars, and/or Pipes): Yes  Has patient been referred to the Quitline?: Patient refused referral  Patient has been referred for addiction treatment: Yes  Maeola SarahJolan E Virgie Kunda, LCSWA 11/26/2017, 9:33 AM

## 2017-11-26 NOTE — [facility] Suicide Risk Assessment (Signed)
[facility]   Principal Problem: Bipolar 1 disorder, mixed, moderate (HCC) Discharge Diagnoses:  Patient Active Problem List   Diagnosis Date Noted  . Cocaine use disorder, moderate, dependence (HCC) [F14.20] 11/19/2017  . Bipolar 1 disorder, mixed, moderate (HCC) [F31.62] 11/05/2017  . MDD (major depressive disorder), severe (HCC) [F32.2] 11/04/2017  . Opioid use disorder, severe, dependence (HCC) [F11.20] 08/07/2017  . Opioid use disorder (HCC) [F11.99] 08/04/2017  . Depression [F32.9]     Total Time spent with patient: 30 minutes  Musculoskeletal: Strength & Muscle Tone: within normal limits Gait & Station: normal Patient leans: N/A  Psychiatric Specialty Exam: Review of Systems  Constitutional: Negative for chills and fever.  Respiratory: Negative for cough and shortness of breath.   Cardiovascular: Negative for chest pain.  Gastrointestinal: Negative for abdominal pain, heartburn, nausea and vomiting.  Psychiatric/Behavioral: Negative for depression, hallucinations and suicidal ideas. The patient is not nervous/anxious and does not have insomnia.     Blood pressure 107/78, pulse 79, temperature 97.7 F (36.5 C), temperature source Oral, resp. rate 18, height 5' 7.75" (1.721 m), weight 90.4 kg (199 lb 4 oz), last menstrual period 10/16/2017.Body mass index is 30.52 kg/m.  General Appearance: Casual and Fairly Groomed  Patent attorneyye Contact::  Good  Speech:  Clear and Coherent and Normal Rate  Volume:  Normal  Mood:  Euthymic  Affect:  Appropriate and Congruent  Thought Process:  Coherent and Goal Directed  Orientation:  Full (Time, Place, and Person)  Thought Content:  Logical  Suicidal Thoughts:  No  Homicidal Thoughts:  No  Memory:  Immediate;   Fair Recent;   Fair Remote;   Fair  Judgement:  Fair  Insight:  Fair  Psychomotor Activity:  Normal  Concentration:  Fair  Recall:  FiservFair  Fund of Knowledge:Fair  Language: Fair  Akathisia:  No   Handed:    AIMS (if indicated):     Assets:  Manufacturing systems engineerCommunication Skills Physical Health Resilience Social Support  Sleep:  Number of Hours: 6.5  Cognition: WNL  ADL's:  Intact   Mental Status Per Nursing Assessment::   On Admission:  Suicidal ideation indicated by patient, Self-harm thoughts  Demographic Factors:  Caucasian and Low socioeconomic status  Loss Factors: NA  Historical Factors: Family history of mental illness or substance abuse and Impulsivity  Risk Reduction Factors:   Living with another person, especially a relative, Positive social support, Positive therapeutic relationship and Positive coping skills or problem solving skills  Continued Clinical Symptoms:  Bipolar Disorder:   Depressive phase Alcohol/Substance Abuse/Dependencies  Cognitive Features That Contribute To Risk:  None    Suicide Risk:  Minimal: No identifiable suicidal ideation.  Patients presenting with no risk factors but with morbid ruminations; may be classified as minimal risk based on the severity of the depressive symptoms  Follow-up Information    [facility] Follow up.   Why:  Appointment is 12/01/17 at 8:00am. Please bring photo ID/proof of [place] residency, medication supply provided by hosptital, and clothing (no leggings). Thank you.  Contact information: [address] [place] [address] [phone]        [facility] Follow up.   Specialty:  Behavioral Health Why:  Walk in hours are Monday-Friday 8:00am-4:30pm. Bring your ID and hospital d/c paperwork. Make sure to let them know you need help getting your prescriptions filled so that you have at least a 2 week supply [facility].  Contact information: [address] [place] [address] [phone]         Subjective Data:   Amanda Carrillo is a 42 y/o F with history of Bipolar I and polysubstance abuse who was admitted voluntarily as a walk-in to [facility] with worsening depression, SI,  recent suicide attempt via hanging, and worsening substance use. Pt has recent relevant history of discharge from [facility] on 11[facility]09[facility]17. She was stabilized on prozac and olanzapine, and she was referred to [facility] for follow up. Pt had indicated on initial intake that she was struggling with mood swings, agitation, depression, and had attempted to hang herself prior to coming back to [facility] for treatment. She also reports relapse on cocaine, alcohol, and methamphetamine. Pt was started on depakote and dose was titrated up during her stay, and her level was drawn on 11/24/17 and was within therapeutic window. She was also started on olanzapine. Yesterday, she was started on naltrexone to address cravings for cocaine. She has been working with SW team to secure referral to [facility] for substance use treatment.  Today upon evaluation, pt shares, "I'm feeling good. I had a nightmare last night, but I left the nicotine patch on, so I think it was that. Otherwise, I'm good." Pt denies any specific concerns today. She denies physical complaints. She denies SI/HI/AH/VH. She is sleeping well aside from nightmare last evening. Her appetite is good. She feels that her medications have been helpful. She has been working with SW team about securing a bed at [facility] for substance use treatment, but there are no current beds available. Pt is open to alternative of walking-in to [facility] for a screening. She feels that she would be safe to discharge to home, and plan for walking in to [facility] for screening after the weekend. She is in agreement to referral for outpatient mental health follow up. She is in agreement to continue her current regimen without changes. She was able to engage in safety planning including plan to return to [facility] or contact emergency services if she feels unable to maintain her own safety or the safety of others. Pt had no further questions, comments, or concerns.   Plan Of Care/Follow-up recommendations:   -Discharge to  outpatient level of care  -Bipolar I, current episode mixed             - Continue Depakote 250 mg PO QAM and 500 mg QHS for mood stabilization.             -Continue Zyprexa 10 mg PO QHS for mood control.             -Continue remeron 15mg  po qhs  -Anxiety/agitation             -ContinueNeurontin 400 mg qidfor anxiety             - Continue Vistaril 50 mg Q6H PRN for anxiety  -Polysubstance abuse            - Continue  Naltrexone 50 mg po daily for cocaine cravings.  -Insomnia             - Continue Trazodone 150 mg QHS for sleep.  Activity:  as tolerated Diet:  normal Tests:  NA Other:  see above for DC plan  Micheal Likens, MD 11/26/2017, 9:44 AM

## 2017-11-26 NOTE — Discharge Summary (Addendum)
Physician Discharge Summary Note  Patient:  Amanda Carrillo is an 42 y.o., female  MRN:  [id number]  DOB:  Mar 16, 1976  Patient phone:  [phone] (home)   Patient address:   [place]  [address],  Total Time spent with patient: Greater than 30 minutes  Date of Admission:  11/18/2017 Date of Discharge: 11-26-17  Reason for Admission: Worsening depression, SI, recent suicide attempt via hanging, and worsening substance use.  Principal Problem: Bipolar 1 disorder, mixed, moderate (HCC)  Discharge Diagnoses: Patient Active Problem List   Diagnosis Date Noted  . Cocaine use disorder, moderate, dependence (HCC) [F14.20] 11/19/2017  . Bipolar 1 disorder, mixed, moderate (HCC) [F31.62] 11/05/2017  . MDD (major depressive disorder), severe (HCC) [F32.2] 11/04/2017  . Opioid use disorder, severe, dependence (HCC) [F11.20] 08/07/2017  . Opioid use disorder (HCC) [F11.99] 08/04/2017  . Depression [F32.9]    Past Psychiatric History: Bipolar disorder, Opioid use disorder.  Past Medical History:  Past Medical History:  Diagnosis Date  . ADHD (attention deficit hyperactivity disorder)   . Anxiety   . Depression   . Gastroparesis 01/2017  . Polysubstance abuse (HCC)    herion, methadone, etoh    Past Surgical History:  Procedure Laterality Date  . CHOLECYSTECTOMY    . TUBAL LIGATION    . TUBAL LIGATION     Family History:  Family History  Problem Relation Age of Onset  . Pneumonia Other   . Depression Mother    Family Psychiatric  History: See H&P.  Social History:  Social History   Substance and Sexual Activity  Alcohol Use Yes  . Alcohol/week: 4.8 oz  . Types: 8 Cans of beer per week   Comment: 8 beers every day     Social History   Substance and Sexual Activity  Drug Use Yes  . Types: IV, Cocaine   Comment: $100 daily cocaine    Social History   Socioeconomic History  . Marital status: Divorced    Spouse name: Not on file  . Number of  children: Not on file  . Years of education: Not on file  . Highest education level: Not on file  Occupational History  . Not on file  Social Needs  . Financial resource strain: Not on file  . Food insecurity:    Worry: Not on file    Inability: Not on file  . Transportation needs:    Medical: Not on file    Non-medical: Not on file  Tobacco Use  . Smoking status: Current Every Day Smoker    Packs/day: 2.00    Years: 15.00    Pack years: 30.00    Types: Cigarettes  . Smokeless tobacco: Never Used  Substance and Sexual Activity  . Alcohol use: Yes    Alcohol/week: 4.8 oz    Types: 8 Cans of beer per week    Comment: 8 beers every day  . Drug use: Yes    Types: IV, Cocaine    Comment: $100 daily cocaine  . Sexual activity: Yes    Birth control/protection: Surgical  Lifestyle  . Physical activity:    Days per week: Not on file    Minutes per session: Not on file  . Stress: Not on file  Relationships  . Social connections:    Talks on phone: Not on file    Gets together: Not on file    Attends religious service: Not on file    Active member of club or organization: Not  on file    Attends meetings of clubs or organizations: Not on file    Relationship status: Not on file  Other Topics Concern  . Not on file  Social History Narrative  . Not on file   Hospital Course: (Per Md's SRA):  Amanda Carrillo is a 42 y/o F with history of Bipolar I and polysubstance abuse who [facility] with worsening depression, SI, recent suicide attempt via hanging, and worsening substance use. Pt has recent relevant history of [facility]. She was stabilized on prozac and olanzapine, and [facility] Pt had indicated on initial intake that she was struggling with mood swings, agitation, depression, and had attempted to hang herself [facility]. She also reports relapse on cocaine, alcohol,  andmethamphetamine. Pt was started on depakote and dose was titrated up during her stay, and her level was drawn on 11/24/17 and was within therapeutic window. She was also started on olanzapine. Yesterday, she was started on naltrexone to address cravings for cocaine. She has been working with SW team [facility].  Besides the combination of Olanzapine 10 mg, Depakote DR 250 mg in am, Depakote DR 500 mg respectively Q hs & Mirtazapine 15 mg for mood control/depression, Amanda Carrillo was also medicated & discharged on; Gabapentin 400 mg for agitation, Hydroxyzine 50 mg prn for anxiety, Naltrexone 50 mg for cocaine cravings & Trazodone 150 mg for insomnia. She presented no other significant health issues that required treatment & monitoring. She tolerated her treatment regimen without any adverse effects reported. She was enrolled in & participated in the group counseling sessions being offered & held on this unit. She learned coping skills.   Today upon her discharge evaluation, pt shares, "I'm feeling good. I had a nightmare last night, but I left the nicotine patch on, so I think it was that. Otherwise, I'm good." Pt denies any specific concerns today. She denies physical complaints. She denies SI/HI/AH/VH. She is sleeping well aside from nightmare last evening. Her appetite is good. She feels that her medications have been helpful. She has been working with SW team about securing [facility] for [facility] use treatment, but there are no current beds available. Pt is open to alternative [facility]. She feels that she would be safe to discharge to home, and plan for [facility] for [facility] after the weekend. She is in agreement to referral for outpatient mental health follow up. She is in agreement to continue her current regimen without changes. She was able to engage in safety planning including plan to [facility] or contact  emergency services if she feels unable to maintain her own safety or the safety of others. Pt had no further questions, comments, or concerns.  Upon discharge, Kamyrah presents mentally & medically stable.She [facility], a 7 days worth supply samples [facility]. [facility] in no apparent distress. Transportation per her arrangement.  Physical Findings: AIMS: Facial and Oral Movements Muscles of Facial Expression: None, normal Lips and Perioral Area: None, normal Jaw: None, normal Tongue: None, normal,Extremity Movements Upper (arms, wrists, hands, fingers): None, normal Lower (legs, knees, ankles, toes): None, normal, Trunk Movements Neck, shoulders, hips: None, normal, Overall Severity Severity of abnormal movements (highest score from questions above):  None, normal Incapacitation due to abnormal movements: None, normal Patient's awareness of abnormal movements (rate only patient's report): No Awareness, Dental Status Current problems with teeth and/or dentures?: No Does patient usually wear dentures?: No  CIWA:  CIWA-Ar Total: 1 COWS:  COWS Total Score: 2  Musculoskeletal: Strength & Muscle Tone: within normal limits Gait & Station: normal Patient leans: N/A  Psychiatric Specialty Exam: Physical Exam  Constitutional: She appears well-developed.  HENT:  Head: Normocephalic.  Eyes: Pupils are equal, round, and reactive to light.  Neck: Normal range of motion.  Cardiovascular: Normal rate.  Respiratory: Effort normal.  GI: Soft.  Genitourinary:  Genitourinary Comments: Deferred  Musculoskeletal: Normal range of motion.  Neurological: She is alert.  Skin: Skin is warm.    Review of Systems  Constitutional: Negative.   HENT: Negative.   Eyes: Negative.   Respiratory: Negative.   Cardiovascular: Negative.   Gastrointestinal: Negative.   Musculoskeletal: Negative.   Skin: Negative.   Neurological:  Negative.   Endo/Heme/Allergies: Negative.   Psychiatric/Behavioral: Positive for depression (Stabilized with medication prior to discharge) and substance abuse (Hx. Hx. Cocaine use disorder). Negative for hallucinations, memory loss and suicidal ideas. The patient has insomnia (Stabilized with medication prior to discharge). The patient is not nervous/anxious (Stabilized with medication prior to dicharge).     Blood pressure 107/78, pulse 79, temperature 97.7 F (36.5 C), temperature source Oral, resp. rate 18, height 5' 7.75" (1.721 m), weight 90.4 kg (199 lb 4 oz), last menstrual period 10/16/2017.Body mass index is 30.52 kg/m.  See Md's SRA   Have you used any form of tobacco in the last 30 days? (Cigarettes, Smokeless Tobacco, Cigars, and/or Pipes): Yes  Has this patient used any form of tobacco in the last 30 days? (Cigarettes, Smokeless Tobacco, Cigars, and/or Pipes): N/A  Blood Alcohol level:  Lab Results  Component Value Date   ETH <10 11/19/2017   ETH <10 11/03/2017   Metabolic Disorder Labs:  Lab Results  Component Value Date   HGBA1C 5.1 11/19/2017   MPG 99.67 11/19/2017   No results found for: PROLACTIN Lab Results  Component Value Date   CHOL 226 (H) 11/19/2017   TRIG 176 (H) 11/19/2017   HDL 79 11/19/2017   CHOLHDL 2.9 11/19/2017   VLDL 35 11/19/2017   LDLCALC 112 (H) 11/19/2017   See Psychiatric Specialty Exam and Suicide Risk Assessment completed by Attending Physician prior to discharge.  Discharge destination:  Home  Is patient on multiple antipsychotic therapies at discharge:  No   Has Patient had three or more failed trials of antipsychotic monotherapy by history:  No  Recommended Plan for Multiple Antipsychotic Therapies: NA  Allergies as of 11/26/2017      Reactions   Toradol [ketorolac Tromethamine] Hives, Nausea And Vomiting      Medication List    STOP taking these medications   FLUoxetine 20 MG capsule Commonly known as:  PROZAC    ibuprofen 200 MG tablet Commonly known as:  ADVIL,MOTRIN     TAKE these medications     Indication  divalproex 250 MG DR tablet Commonly known as:  DEPAKOTE Take 1 tablet (250 mg) by mouth in the morning & 2 Tablets (500 mg) at bedtime: For mood stabilization  Indication:  Mood stabilization   fluticasone 50 MCG/ACT nasal spray Commonly known as:  FLONASE Place 2 sprays into both nostrils daily. For allergies Start taking on:  11/27/2017 What changed:  how much to take  Indication:  Allergic Rhinitis,  Signs and Symptoms of Nose Diseases   gabapentin 400 MG capsule Commonly known as:  NEURONTIN Take 1 capsule (400 mg total) by mouth 4 (four) times daily - after meals and at bedtime. For agitation/substance withdrawal symptoms What changed:    when to take this  additional instructions  Indication:  Agitation/substance withdrawal symptoms.   hydrOXYzine 50 MG tablet Commonly known as:  ATARAX/VISTARIL Take 1 tablet (50 mg total) by mouth every 6 (six) hours as needed for anxiety (Sleep). What changed:    medication strength  how much to take  Indication:  Feeling Anxious, Insomnia   mirtazapine 15 MG tablet Commonly known as:  REMERON Take 1 tablet (15 mg total) by mouth at bedtime. For depression/sleep  Indication:  Major Depressive Disorder, Insomnia   naltrexone 50 MG tablet Commonly known as:  DEPADE Take 1 tablet (50 mg total) by mouth daily. For Cocaine cravings Start taking on:  11/27/2017  Indication:  Cocaine cravings.   nicotine 21 mg/24hr patch Commonly known as:  NICODERM CQ - dosed in mg/24 hours Place 1 patch (21 mg total) onto the skin daily. (May purchase from over the counter): For smoking cessation Start taking on:  11/27/2017  Indication:  Nicotine Addiction   OLANZapine 10 MG tablet Commonly known as:  ZYPREXA Take 1 tablet (10 mg total) by mouth at bedtime. For mood control  Indication:  Mood control   traZODone 150 MG tablet Commonly  known as:  DESYREL Take 1 tablet (150 mg total) by mouth at bedtime. For sleep  Indication:  Trouble Sleeping      Follow-up Information    [facility].   Why:  Appointment is 12/01/17 at 8:00am. Please bring photo ID/proof of [facility] residency, medication supply provided by hosptital, and clothing (no leggings). Thank you.  Contact information: [address] [place] [address] [phone]        [facility] Follow up.   Specialty:  [facility] information: [address] [place] [address] [phone]          Follow-up recommendations: Activity:  As tolerated Diet: As recommended by your primary care doctor. Keep all scheduled follow-up appointments as recommended.   Comments: Patient is instructed prior to discharge to: Take all medications as prescribed by his/her mental healthcare provider. Report any adverse effects and or reactions from the medicines to his/her outpatient provider promptly. Patient has been instructed & cautioned: To not engage in alcohol and or illegal drug use while on prescription medicines. In the event of worsening symptoms, patient is instructed to call the crisis hotline, 911 and or go to the nearest ED for appropriate evaluation and treatment of symptoms. To follow-up with his/her primary care provider for your other medical issues, concerns and or health care needs.   Signed: Armandina Stammer, NP, PMHNP, FNP-BC 11/26/2017, 9:31 AM    Patient seen, Suicide Assessment Completed.  Disposition Plan Reviewed

## 2017-11-26 NOTE — Progress Notes (Signed)
D: Jmya has been pleasant, anxious, and cooperative this a.m. She has said she is ready for discharge if she has a place to go. She denied SI, HI, and AVH.  A: Meds given as ordered. Q15 safety checks maintained. Support/encouragement offered.  R: Pt remains free from harm and continues with treatment. Will continue to monitor for needs/safety.

## 2017-11-26 NOTE — Progress Notes (Signed)
Patient was discharged per order. AVS, SRA, medication samples, scripts and transition summary were all reviewed with patient. Pt was given an opportunity to ask questions and verbalized understanding of all discharge paperwork. Belongings were returned, and patient signed for receipt. Patient verbalized readiness for discharge and appeared in no acute distress when escorted to lobby.

## 2017-12-01 NOTE — Addendum Note (Signed)
Addended by: Bufford SpikesFULCHER, Roniya Tetro N on: 12/01/2017 05:19 PM   Modules accepted: Orders

## 2017-12-02 ENCOUNTER — Encounter: Payer: Self-pay | Admitting: Nurse Practitioner

## 2017-12-02 ENCOUNTER — Telehealth: Payer: Self-pay | Admitting: Internal Medicine

## 2017-12-02 ENCOUNTER — Ambulatory Visit: Payer: Self-pay | Admitting: Nurse Practitioner

## 2017-12-02 NOTE — Telephone Encounter (Signed)
PATIENT WAS A NO SHOW AND LETTER SENT  °

## 2017-12-02 NOTE — Progress Notes (Deleted)
Primary Care Physician:  Patient, No Pcp Per Primary Gastroenterologist:  Dr. Jena Gauss  No chief complaint on file.   HPI:   Amanda Carrillo is a 42 y.o. female who presents on referral from the emergency department for abdominal pain.  Patient was seen in the emergency department 08/31/2017 for abdominal pain with nausea and vomiting.  Symptoms ongoing for 3 days, currently on her menstrual cycle.  Increased uterine pain with passing of clots.  Pain radiates into her upper abdomen.  2 similar episodes in the past and told she has gastroparesis with the first episode.  CT scan subsequently showed colitis at her previous visit.  CBC showed normal hemoglobin at 15.1, bilirubin mildly elevated.  She was positive for cocaine and THC other labs essentially normal related to GI presentation.  Deemed no surgical abdomen.  Informed of cyclic vomiting syndrome and gastroparesis could worsen with opioids.  She was given IV hydration and antiemetics and was denied pain medication.  Discharged in satisfactory condition.  She was previously referred to [place] by the emergency department but she was a no-show for her visit.  Today she states   Past Medical History:  Diagnosis Date  . ADHD (attention deficit hyperactivity disorder)   . Anxiety   . Depression   . Gastroparesis 01/2017  . Polysubstance abuse (HCC)    herion, methadone, etoh    Past Surgical History:  Procedure Laterality Date  . CHOLECYSTECTOMY    . TUBAL LIGATION    . TUBAL LIGATION      Current Outpatient Medications  Medication Sig Dispense Refill  . divalproex (DEPAKOTE) 250 MG DR tablet Take 1 tablet (250 mg) by mouth in the morning & 2 Tablets (500 mg) at bedtime: For mood stabilization 90 tablet 0  . fluticasone (FLONASE) 50 MCG/ACT nasal spray Place 2 sprays into both nostrils daily. For allergies 1 g 0  . gabapentin (NEURONTIN) 400 MG capsule Take 1 capsule (400 mg total) by mouth 4 (four) times daily - after meals and at  bedtime. For agitation/substance withdrawal symptoms 120 capsule 0  . hydrOXYzine (ATARAX/VISTARIL) 50 MG tablet Take 1 tablet (50 mg total) by mouth every 6 (six) hours as needed for anxiety (Sleep). 60 tablet 0  . mirtazapine (REMERON) 15 MG tablet Take 1 tablet (15 mg total) by mouth at bedtime. For depression/sleep 30 tablet 0  . naltrexone (DEPADE) 50 MG tablet Take 1 tablet (50 mg total) by mouth daily. For Cocaine cravings 30 tablet 0  . nicotine (NICODERM CQ - DOSED IN MG/24 HOURS) 21 mg/24hr patch Place 1 patch (21 mg total) onto the skin daily. (May purchase from over the counter): For smoking cessation 28 patch 0  . OLANZapine (ZYPREXA) 10 MG tablet Take 1 tablet (10 mg total) by mouth at bedtime. For mood control 30 tablet 0  . traZODone (DESYREL) 150 MG tablet Take 1 tablet (150 mg total) by mouth at bedtime. For sleep 30 tablet 0   No current facility-administered medications for this visit.     Allergies as of 12/02/2017 - Review Complete 11/19/2017  Allergen Reaction Noted  . Toradol [ketorolac tromethamine] Hives and Nausea And Vomiting 06/19/2015    Family History  Problem Relation Age of Onset  . Pneumonia Other   . Depression Mother     Social History   Socioeconomic History  . Marital status: Divorced    Spouse name: Not on file  . Number of children: Not on file  . Years of  education: Not on file  . Highest education level: Not on file  Occupational History  . Not on file  Social Needs  . Financial resource strain: Not on file  . Food insecurity:    Worry: Not on file    Inability: Not on file  . Transportation needs:    Medical: Not on file    Non-medical: Not on file  Tobacco Use  . Smoking status: Current Every Day Smoker    Packs/day: 2.00    Years: 15.00    Pack years: 30.00    Types: Cigarettes  . Smokeless tobacco: Never Used  Substance and Sexual Activity  . Alcohol use: Yes    Alcohol/week: 4.8 oz    Types: 8 Cans of beer per week     Comment: 8 beers every day  . Drug use: Yes    Types: IV, Cocaine    Comment: $100 daily cocaine  . Sexual activity: Yes    Birth control/protection: Surgical  Lifestyle  . Physical activity:    Days per week: Not on file    Minutes per session: Not on file  . Stress: Not on file  Relationships  . Social connections:    Talks on phone: Not on file    Gets together: Not on file    Attends religious service: Not on file    Active member of club or organization: Not on file    Attends meetings of clubs or organizations: Not on file    Relationship status: Not on file  . Intimate partner violence:    Fear of current or ex partner: Not on file    Emotionally abused: Not on file    Physically abused: Not on file    Forced sexual activity: Not on file  Other Topics Concern  . Not on file  Social History Narrative  . Not on file    Review of Systems: General: Negative for anorexia, weight loss, fever, chills, fatigue, weakness. Eyes: Negative for vision changes.  ENT: Negative for hoarseness, difficulty swallowing , nasal congestion. CV: Negative for chest pain, angina, palpitations, dyspnea on exertion, peripheral edema.  Respiratory: Negative for dyspnea at rest, dyspnea on exertion, cough, sputum, wheezing.  GI: See history of present illness. GU:  Negative for dysuria, hematuria, urinary incontinence, urinary frequency, nocturnal urination.  MS: Negative for joint pain, low back pain.  Derm: Negative for rash or itching.  Neuro: Negative for weakness, abnormal sensation, seizure, frequent headaches, memory loss, confusion.  Psych: Negative for anxiety, depression, suicidal ideation, hallucinations.  Endo: Negative for unusual weight change.  Heme: Negative for bruising or bleeding. Allergy: Negative for rash or hives.    Physical Exam: There were no vitals taken for this visit. General:   Alert and oriented. Pleasant and cooperative. Well-nourished and well-developed.    Head:  Normocephalic and atraumatic. Eyes:  Without icterus, sclera clear and conjunctiva pink.  Ears:  Normal auditory acuity. Mouth:  No deformity or lesions, oral mucosa pink.  Throat/Neck:  Supple, without mass or thyromegaly. Cardiovascular:  S1, S2 present without murmurs appreciated. Normal pulses noted. Extremities without clubbing or edema. Respiratory:  Clear to auscultation bilaterally. No wheezes, rales, or rhonchi. No distress.  Gastrointestinal:  +BS, soft, non-tender and non-distended. No HSM noted. No guarding or rebound. No masses appreciated.  Rectal:  Deferred  Musculoskalatal:  Symmetrical without gross deformities. Normal posture. Skin:  Intact without significant lesions or rashes. Neurologic:  Alert and oriented x4;  grossly normal neurologically. Psych:  Alert and cooperative. Normal mood and affect. Heme/Lymph/Immune: No significant cervical adenopathy. No excessive bruising noted.    12/02/2017 1:07 PM   Disclaimer: This note was dictated with voice recognition software. Similar sounding words can inadvertently be transcribed and may not be corrected upon review.

## 2017-12-03 NOTE — Telephone Encounter (Signed)
Noted  

## 2018-09-11 ENCOUNTER — Encounter (HOSPITAL_COMMUNITY): Payer: Self-pay | Admitting: Emergency Medicine

## 2018-09-11 ENCOUNTER — Other Ambulatory Visit: Payer: Self-pay

## 2018-09-11 ENCOUNTER — Emergency Department (HOSPITAL_COMMUNITY)
Admission: EM | Admit: 2018-09-11 | Discharge: 2018-09-11 | Disposition: A | Discharge status: Home / Self Care | Payer: Self-pay | Attending: Emergency Medicine | Admitting: Emergency Medicine

## 2018-09-11 DIAGNOSIS — Z79899 Other long term (current) drug therapy: Secondary | ICD-10-CM | POA: Insufficient documentation

## 2018-09-11 DIAGNOSIS — L0100 Impetigo, unspecified: Secondary | ICD-10-CM

## 2018-09-11 DIAGNOSIS — F17210 Nicotine dependence, cigarettes, uncomplicated: Secondary | ICD-10-CM | POA: Insufficient documentation

## 2018-09-11 MED ORDER — CEPHALEXIN 500 MG PO CAPS
500.0000 mg | ORAL_CAPSULE | Freq: Once | ORAL | Status: AC
  Administered 2018-09-11: 500 mg via ORAL
  Filled 2018-09-11: qty 1

## 2018-09-11 MED ORDER — CEPHALEXIN 500 MG PO CAPS: ORAL_CAPSULE | ORAL | 0 refills | Status: DC

## 2018-09-11 NOTE — ED Provider Notes (Signed)
[facility] EMERGENCY DEPARTMENT Provider Note   CSN: [id number] Arrival date & time: 09/11/18  1147     History   Chief Complaint Chief Complaint  Patient presents with  . Rash    HPI Amanda Carrillo is a 43 y.o. female.  Patient is a 43 year old female who presents to the emergency department with a complaint of rash on her legs.  Patient has a history of polysubstance abuse, including cocaine and heroin.  She is currently on methadone.  Patient has a history of anxiety with panic attacks.  Attention deficit hyperactivity disorder, and bipolar illness.  The patient states that she has been bothered with this rash off and on over the last 6 weeks.  She has been using conservative measures including cleansing with soap and water, alcohol, and applying Neosporin.  The patient states that she is a previous heroin user.  She is currently on methadone.  And she is concerned that she may have contracted some infection that could be causing an issue with rash.  The rash is confined mostly to the lower extremities.  No high fever reported.  No unusual back pain reported.  The patient states that the areas on her legs sometimes get hot to touch.  She says when they rupture they have pus and sometimes small amount of blood in the.  The patient presents now for assistance with this issue.  The history is provided by the patient.    Past Medical History:  Diagnosis Date  . ADHD (attention deficit hyperactivity disorder)   . Anxiety   . Depression   . Gastroparesis 01/2017  . Polysubstance abuse (HCC)    herion, methadone, etoh    Patient Active Problem List   Diagnosis Date Noted  . Cocaine use disorder, moderate, dependence (HCC) 11/19/2017  . Bipolar 1 disorder, mixed, moderate (HCC) 11/05/2017  . MDD (major depressive disorder), severe (HCC) 11/04/2017  . Opioid use disorder, severe, dependence (HCC) 08/07/2017  . Opioid use disorder (HCC) 08/04/2017  . Depression     Past  Surgical History:  Procedure Laterality Date  . CHOLECYSTECTOMY    . TUBAL LIGATION    . TUBAL LIGATION       OB History    Gravida  3   Para  2   Term  2   Preterm      AB  1   Living        SAB      TAB  1   Ectopic      Multiple      Live Births               Home Medications    Prior to Admission medications   Medication Sig Start Date End Date Taking? Authorizing Provider  divalproex (DEPAKOTE) 250 MG DR tablet Take 1 tablet (250 mg) by mouth in the morning & 2 Tablets (500 mg) at bedtime: For mood stabilization 11/26/17   Nwoko, Nicole KindredAgnes I, NP  fluticasone (FLONASE) 50 MCG/ACT nasal spray Place 2 sprays into both nostrils daily. For allergies 11/27/17   Armandina StammerNwoko, Agnes I, NP  gabapentin (NEURONTIN) 400 MG capsule Take 1 capsule (400 mg total) by mouth 4 (four) times daily - after meals and at bedtime. For agitation/substance withdrawal symptoms 11/26/17   Armandina StammerNwoko, Agnes I, NP  hydrOXYzine (ATARAX/VISTARIL) 50 MG tablet Take 1 tablet (50 mg total) by mouth every 6 (six) hours as needed for anxiety (Sleep). 11/26/17   Sanjuana KavaNwoko, Agnes I, NP  mirtazapine (REMERON) 15 MG tablet Take 1 tablet (15 mg total) by mouth at bedtime. For depression/sleep 11/26/17   Armandina Stammer I, NP  naltrexone (DEPADE) 50 MG tablet Take 1 tablet (50 mg total) by mouth daily. For Cocaine cravings 11/27/17   Armandina Stammer I, NP  nicotine (NICODERM CQ - DOSED IN MG/24 HOURS) 21 mg/24hr patch Place 1 patch (21 mg total) onto the skin daily. (May purchase from over the counter): For smoking cessation 11/27/17   Armandina Stammer I, NP  OLANZapine (ZYPREXA) 10 MG tablet Take 1 tablet (10 mg total) by mouth at bedtime. For mood control 11/26/17   Armandina Stammer I, NP  traZODone (DESYREL) 150 MG tablet Take 1 tablet (150 mg total) by mouth at bedtime. For sleep 11/26/17   Sanjuana Kava, NP    Family History Family History  Problem Relation Age of Onset  . Pneumonia Other   . Depression Mother     Social  History Social History   Tobacco Use  . Smoking status: Current Every Day Smoker    Packs/day: 1.00    Years: 15.00    Pack years: 15.00    Types: Cigarettes  . Smokeless tobacco: Never Used  Substance Use Topics  . Alcohol use: Not Currently    Alcohol/week: 8.0 standard drinks    Types: 8 Cans of beer per week    Comment: 8 beers every day  . Drug use: Not Currently    Types: IV, Cocaine    Comment: states she relapsed a couple days ago cocaine and heroine 09/11/18     Allergies   Toradol [ketorolac tromethamine]   Review of Systems Review of Systems  Constitutional: Negative for activity change and fever.       All ROS Neg except as noted in HPI  HENT: Negative for nosebleeds.   Eyes: Negative for photophobia and discharge.  Respiratory: Negative for cough, shortness of breath and wheezing.   Cardiovascular: Negative for chest pain and palpitations.  Gastrointestinal: Negative for abdominal pain and blood in stool.  Genitourinary: Negative for dysuria, frequency and hematuria.  Musculoskeletal: Negative for arthralgias, back pain and neck pain.  Skin: Positive for rash.  Neurological: Negative for dizziness, seizures and speech difficulty.  Psychiatric/Behavioral: Negative for confusion and hallucinations. The patient is nervous/anxious.      Physical Exam Updated Vital Signs BP (!) 144/79 (BP Location: Right Arm)   Pulse 89   Temp 98.2 F (36.8 C) (Oral)   Resp 16   Ht 5\' 9"  (1.753 m)   Wt 104.3 kg   LMP 08/18/2018   SpO2 97%   BMI 33.97 kg/m   Physical Exam Vitals signs and nursing note reviewed.  Constitutional:      Appearance: She is well-developed. She is not toxic-appearing.  HENT:     Head: Normocephalic.     Right Ear: Tympanic membrane and external ear normal.     Left Ear: Tympanic membrane and external ear normal.  Eyes:     General: Lids are normal.     Pupils: Pupils are equal, round, and reactive to light.  Neck:      Musculoskeletal: Normal range of motion and neck supple.     Vascular: No carotid bruit.  Cardiovascular:     Rate and Rhythm: Normal rate and regular rhythm.     Pulses: Normal pulses.     Heart sounds: Normal heart sounds. No murmur. No friction rub. No gallop.   Pulmonary:  Effort: No respiratory distress.     Breath sounds: Normal breath sounds.  Abdominal:     General: Bowel sounds are normal.     Palpations: Abdomen is soft.     Tenderness: There is no abdominal tenderness. There is no guarding.  Musculoskeletal: Normal range of motion.  Lymphadenopathy:     Head:     Right side of head: No submandibular adenopathy.     Left side of head: No submandibular adenopathy.     Cervical: No cervical adenopathy.  Skin:    General: Skin is warm and dry.     Comments: Patient had raised areas of the lateral right and left lower leg.  One lesion on the dorsum of the right foot, and on the lateral left ankle.  Some of the areas have begun to dry and.  Others of them have scabs from what look like was possibly a blister on top.  No red streaks appreciated.  The areas are not hot.  No active draining at this time.  Neurological:     Mental Status: She is alert and oriented to person, place, and time.     Cranial Nerves: No cranial nerve deficit.     Sensory: No sensory deficit.  Psychiatric:        Speech: Speech normal.      ED Treatments / Results  Labs (all labs ordered are listed, but only abnormal results are displayed) Labs Reviewed - No data to display  EKG None  Radiology No results found.  Procedures Procedures (including critical care time)  Medications Ordered in ED Medications  cephALEXin (KEFLEX) capsule 500 mg (has no administration in time range)     Initial Impression / Assessment and Plan / ED Course  I have reviewed the triage vital signs and the nursing notes.  Pertinent labs & imaging results that were available during my care of the patient were  reviewed by me and considered in my medical decision making (see chart for details).       Final Clinical Impressions(s) / ED Diagnoses MDM  Blood pressure is elevated at 144/79.  I have asked the patient to have this rechecked at the local health department or clinic of her choice.  The rash suggest possible staph related infections.  Most of them are drying in at this time.  No red streaks appreciated.  No high fever reported.  No evidence of abscess or cellulitis.  Patient will be treated with Keflex for possible staph related infection/impetigo.  I have asked the patient to continue to cleanse the areas with soap and water, and/or alcohol.  I have asked her to continue the Neosporin.  I have asked her to wash her hands frequently especially after working with the raised bump areas.  Patient is in agreement with this plan.  Patient will see her physician, or the clinic of her choice, or return to the emergency department if any changes in condition, problems, or concerns.   Final diagnoses:  Impetigo    ED Discharge Orders         Ordered    cephALEXin (KEFLEX) 500 MG capsule     09/11/18 1307           Ivery Quale, PA-C 09/11/18 1318    Doug Sou, MD 09/11/18 1511

## 2018-09-11 NOTE — Discharge Instructions (Addendum)
Please continue to cleanse the area with soap and water, and/or alcohol.  Continue the Neosporin.  Use Keflex 4 times daily with food.  Please see your physician or return to the emergency department if any red streaks going up your arm, fever that would not respond to Tylenol or ibuprofen, problems, or concerns.

## 2018-09-11 NOTE — ED Triage Notes (Signed)
Pt reports a rash to her legs for over 6 weeks.

## 2018-09-28 ENCOUNTER — Emergency Department (HOSPITAL_COMMUNITY)
Admission: EM | Admit: 2018-09-28 | Discharge: 2018-09-29 | Disposition: A | Discharge status: Home / Self Care | Payer: Self-pay | Attending: Emergency Medicine | Admitting: Emergency Medicine

## 2018-09-28 ENCOUNTER — Other Ambulatory Visit: Payer: Self-pay

## 2018-09-28 ENCOUNTER — Encounter (HOSPITAL_COMMUNITY): Payer: Self-pay | Admitting: *Deleted

## 2018-09-28 DIAGNOSIS — S0121XA Laceration without foreign body of nose, initial encounter: Secondary | ICD-10-CM | POA: Insufficient documentation

## 2018-09-28 DIAGNOSIS — F17210 Nicotine dependence, cigarettes, uncomplicated: Secondary | ICD-10-CM | POA: Insufficient documentation

## 2018-09-28 DIAGNOSIS — Y92003 Bedroom of unspecified non-institutional (private) residence as the place of occurrence of the external cause: Secondary | ICD-10-CM | POA: Insufficient documentation

## 2018-09-28 DIAGNOSIS — W01190A Fall on same level from slipping, tripping and stumbling with subsequent striking against furniture, initial encounter: Secondary | ICD-10-CM | POA: Insufficient documentation

## 2018-09-28 DIAGNOSIS — S0181XA Laceration without foreign body of other part of head, initial encounter: Secondary | ICD-10-CM

## 2018-09-28 DIAGNOSIS — Z23 Encounter for immunization: Secondary | ICD-10-CM | POA: Insufficient documentation

## 2018-09-28 DIAGNOSIS — Z79899 Other long term (current) drug therapy: Secondary | ICD-10-CM | POA: Insufficient documentation

## 2018-09-28 DIAGNOSIS — Y9389 Activity, other specified: Secondary | ICD-10-CM | POA: Insufficient documentation

## 2018-09-28 DIAGNOSIS — Y999 Unspecified external cause status: Secondary | ICD-10-CM | POA: Insufficient documentation

## 2018-09-28 NOTE — ED Triage Notes (Signed)
Pt states she was getting up to go to the bathroom and her foot got caught in her floor and hit the bed; pt has one inch laceration to the middle of her nose; pt states she may have blacked out she is not for sure

## 2018-09-28 NOTE — ED Provider Notes (Addendum)
[facility]ERGENCY DEPARTMENT Provider Note   CSN: [id number] Arrival date & time: 09/28/18  2343     History   Chief Complaint Chief Complaint  Patient presents with  . Laceration    HPI Amanda Carrillo is a 43 y.o. female.  Patient states she slipped on a rug in her bedroom, falling face first and hitting her head/nose on the corner of the bed. She has a laceration over the bridge of her nose. No active bleeding at this time. No loss of consciousness. No epistaxis.  The history is provided by the patient. No language interpreter was used.  Laceration  Location:  Face Facial laceration location:  Nose Quality: straight   Bleeding: controlled   Laceration mechanism:  Fall Pain details:    Quality:  Throbbing Foreign body present:  No foreign bodies Tetanus status:  Out of date   Past Medical History:  Diagnosis Date  . ADHD (attention deficit hyperactivity disorder)   . Anxiety   . Depression   . Gastroparesis 01/2017  . Polysubstance abuse (HCC)    herion, methadone, etoh    Patient Active Problem List   Diagnosis Date Noted  . Cocaine use disorder, moderate, dependence (HCC) 11/19/2017  . Bipolar 1 disorder, mixed, moderate (HCC) 11/05/2017  . MDD (major depressive disorder), severe (HCC) 11/04/2017  . Opioid use disorder, severe, dependence (HCC) 08/07/2017  . Opioid use disorder (HCC) 08/04/2017  . Depression     Past Surgical History:  Procedure Laterality Date  . CHOLECYSTECTOMY    . TUBAL LIGATION    . TUBAL LIGATION       OB History    Gravida  3   Para  2   Term  2   Preterm      AB  1   Living        SAB      TAB  1   Ectopic      Multiple      Live Births               Home Medications    Prior to Admission medications   Medication Sig Start Date End Date Taking? Authorizing Provider  cephALEXin (KEFLEX) 500 MG capsule 1 po qid with food 09/11/18   Ivery QualeBryant, Hobson, PA-C  divalproex (DEPAKOTE) 250 MG DR tablet  Take 1 tablet (250 mg) by mouth in the morning & 2 Tablets (500 mg) at bedtime: For mood stabilization 11/26/17   Nwoko, Nicole KindredAgnes I, NP  fluticasone (FLONASE) 50 MCG/ACT nasal spray Place 2 sprays into both nostrils daily. For allergies 11/27/17   Armandina StammerNwoko, Agnes I, NP  gabapentin (NEURONTIN) 400 MG capsule Take 1 capsule (400 mg total) by mouth 4 (four) times daily - after meals and at bedtime. For agitation/substance withdrawal symptoms 11/26/17   Armandina StammerNwoko, Agnes I, NP  hydrOXYzine (ATARAX/VISTARIL) 50 MG tablet Take 1 tablet (50 mg total) by mouth every 6 (six) hours as needed for anxiety (Sleep). 11/26/17   Armandina StammerNwoko, Agnes I, NP  mirtazapine (REMERON) 15 MG tablet Take 1 tablet (15 mg total) by mouth at bedtime. For depression/sleep 11/26/17   Armandina StammerNwoko, Agnes I, NP  naltrexone (DEPADE) 50 MG tablet Take 1 tablet (50 mg total) by mouth daily. For Cocaine cravings 11/27/17   Armandina StammerNwoko, Agnes I, NP  nicotine (NICODERM CQ - DOSED IN MG/24 HOURS) 21 mg/24hr patch Place 1 patch (21 mg total) onto the skin daily. (May purchase from over the counter): For smoking cessation 11/27/17  Armandina Stammer I, NP  OLANZapine (ZYPREXA) 10 MG tablet Take 1 tablet (10 mg total) by mouth at bedtime. For mood control 11/26/17   Armandina Stammer I, NP  traZODone (DESYREL) 150 MG tablet Take 1 tablet (150 mg total) by mouth at bedtime. For sleep 11/26/17   Sanjuana Kava, NP    Family History Family History  Problem Relation Age of Onset  . Pneumonia Other   . Depression Mother     Social History Social History   Tobacco Use  . Smoking status: Current Every Day Smoker    Packs/day: 1.00    Years: 15.00    Pack years: 15.00    Types: Cigarettes  . Smokeless tobacco: Never Used  Substance Use Topics  . Alcohol use: Not Currently    Alcohol/week: 8.0 standard drinks    Types: 8 Cans of beer per week    Comment: 8 beers every day  . Drug use: Not Currently    Types: IV, Cocaine    Comment: states she relapsed a couple days ago cocaine and  heroine 09/11/18     Allergies   Toradol [ketorolac tromethamine]   Review of Systems Review of Systems  Skin: Positive for wound.  All other systems reviewed and are negative.    Physical Exam Updated Vital Signs BP 139/89 (BP Location: Right Arm)   Pulse 86   Temp (!) 97.4 F (36.3 C) (Oral)   Resp 20   Ht 5\' 9"  (1.753 m)   Wt 104 kg   SpO2 96%   BMI 33.86 kg/m   Physical Exam Vitals signs and nursing note reviewed.  Constitutional:      Appearance: Normal appearance.  HENT:     Head:      Nose: Signs of injury, laceration and nasal tenderness present. No nasal deformity.     Right Nostril: No epistaxis.     Left Nostril: No epistaxis.      Mouth/Throat:     Mouth: Mucous membranes are moist.     Pharynx: Oropharynx is clear.  Eyes:     Conjunctiva/sclera: Conjunctivae normal.  Neck:     Musculoskeletal: Normal range of motion and neck supple. No muscular tenderness.  Cardiovascular:     Rate and Rhythm: Normal rate and regular rhythm.  Pulmonary:     Effort: Pulmonary effort is normal.     Breath sounds: Normal breath sounds.  Abdominal:     General: Bowel sounds are normal.     Palpations: Abdomen is soft.  Musculoskeletal: Normal range of motion.  Skin:    General: Skin is warm and dry.  Neurological:     Mental Status: She is alert and oriented to person, place, and time.  Psychiatric:        Mood and Affect: Mood normal.        ED Treatments / Results  Labs (all labs ordered are listed, but only abnormal results are displayed) Labs Reviewed - No data to display  EKG None  Radiology No results found.  Procedures .Marland KitchenLaceration Repair Date/Time: 09/29/2018 12:37 AM Performed by: Felicie Morn, NP Authorized by: Felicie Morn, NP   Consent:    Consent obtained:  Verbal   Consent given by:  Patient   Risks discussed:  Infection, pain and poor cosmetic result   Alternatives discussed:  No treatment Anesthesia (see MAR for exact  dosages):    Anesthesia method:  Local infiltration   Local anesthetic:  Lidocaine 1% w/o epi Laceration details:  Location:  Face   Face location:  Nose   Length (cm):  1.1 Repair type:    Repair type:  Simple Pre-procedure details:    Preparation:  Patient was prepped and draped in usual sterile fashion Exploration:    Hemostasis achieved with:  Direct pressure   Wound exploration: entire depth of wound probed and visualized     Contaminated: no   Treatment:    Area cleansed with:  Betadine and saline   Amount of cleaning:  Standard   Irrigation solution:  Sterile saline   Irrigation method:  Syringe Skin repair:    Repair method:  Sutures   Suture size:  5-0   Suture material:  Prolene   Number of sutures:  3 Post-procedure details:    Dressing:  Open (no dressing)   Patient tolerance of procedure:  Tolerated well, no immediate complications   (including critical care time)  Medications Ordered in ED Medications  lidocaine (PF) (XYLOCAINE) 1 % injection 2 mL (2 mLs Infiltration Given 09/29/18 0039)  Tdap (BOOSTRIX) injection 0.5 mL (0.5 mLs Intramuscular Given 09/29/18 0039)  bacitracin 500 UNIT/GM ointment (1 application  Given 09/29/18 0041)     Initial Impression / Assessment and Plan / ED Course  I have reviewed the triage vital signs and the nursing notes.  Pertinent labs & imaging results that were available during my care of the patient were reviewed by me and considered in my medical decision making (see chart for details).     Tetanus updated in ED. Laceration occurred < 12 hours prior to repair. Discussed laceration care with pt and answered questions. Pt to f-u for suture removal in 7 days and wound check sooner should there be signs of dehiscence or infection. Pt is hemodynamically stable with no complaints prior to dc.    Final Clinical Impressions(s) / ED Diagnoses   Final diagnoses:  Facial laceration, initial encounter    ED Discharge Orders     None       Felicie Morn, NP 09/29/18 0042    Felicie Morn, NP 09/29/18 8295    Devoria Albe, MD 09/29/18 [phone]

## 2018-09-29 MED ORDER — BACITRACIN ZINC 500 UNIT/GM EX OINT
TOPICAL_OINTMENT | CUTANEOUS | Status: AC
  Administered 2018-09-29: 1
  Filled 2018-09-29: qty 0.9

## 2018-09-29 MED ORDER — LIDOCAINE HCL (PF) 1 % IJ SOLN
2.0000 mL | Freq: Once | INTRAMUSCULAR | Status: AC
  Administered 2018-09-29: 2 mL
  Filled 2018-09-29: qty 2

## 2018-09-29 MED ORDER — TETANUS-DIPHTH-ACELL PERTUSSIS 5-2.5-18.5 LF-MCG/0.5 IM SUSP
0.5000 mL | Freq: Once | INTRAMUSCULAR | Status: AC
  Administered 2018-09-29: 0.5 mL via INTRAMUSCULAR
  Filled 2018-09-29: qty 0.5

## 2018-09-29 NOTE — Discharge Instructions (Signed)
Suture removal in 7 days

## 2019-04-17 ENCOUNTER — Emergency Department (HOSPITAL_COMMUNITY)
Admission: EM | Admit: 2019-04-17 | Discharge: 2019-04-17 | Disposition: A | Discharge status: Home / Self Care | Payer: Self-pay | Attending: Emergency Medicine | Admitting: Emergency Medicine

## 2019-04-17 ENCOUNTER — Other Ambulatory Visit: Payer: Self-pay

## 2019-04-17 ENCOUNTER — Encounter (HOSPITAL_COMMUNITY): Payer: Self-pay | Admitting: Emergency Medicine

## 2019-04-17 DIAGNOSIS — R112 Nausea with vomiting, unspecified: Secondary | ICD-10-CM

## 2019-04-17 DIAGNOSIS — F17210 Nicotine dependence, cigarettes, uncomplicated: Secondary | ICD-10-CM | POA: Insufficient documentation

## 2019-04-17 DIAGNOSIS — Z79899 Other long term (current) drug therapy: Secondary | ICD-10-CM | POA: Insufficient documentation

## 2019-04-17 DIAGNOSIS — R1013 Epigastric pain: Secondary | ICD-10-CM

## 2019-04-17 LAB — CBC WITH DIFFERENTIAL/PLATELET
Abs Immature Granulocytes: 0.01 10*3/uL (ref 0.00–0.07)
Basophils Absolute: 0 10*3/uL (ref 0.0–0.1)
Basophils Relative: 0 %
Eosinophils Absolute: 0.1 10*3/uL (ref 0.0–0.5)
Eosinophils Relative: 1 %
HCT: 39 % (ref 36.0–46.0)
Hemoglobin: 13.3 g/dL (ref 12.0–15.0)
Immature Granulocytes: 0 %
Lymphocytes Relative: 24 %
Lymphs Abs: 2.5 10*3/uL (ref 0.7–4.0)
MCH: 29.8 pg (ref 26.0–34.0)
MCHC: 34.1 g/dL (ref 30.0–36.0)
MCV: 87.2 fL (ref 80.0–100.0)
Monocytes Absolute: 0.8 10*3/uL (ref 0.1–1.0)
Monocytes Relative: 8 %
Neutro Abs: 7 10*3/uL (ref 1.7–7.7)
Neutrophils Relative %: 67 %
Platelets: 226 10*3/uL (ref 150–400)
RBC: 4.47 MIL/uL (ref 3.87–5.11)
RDW: 13.6 % (ref 11.5–15.5)
WBC: 10.4 10*3/uL (ref 4.0–10.5)
nRBC: 0 % (ref 0.0–0.2)

## 2019-04-17 LAB — COMPREHENSIVE METABOLIC PANEL
ALT: 66 U/L — ABNORMAL HIGH (ref 0–44)
AST: 48 U/L — ABNORMAL HIGH (ref 15–41)
Albumin: 3.9 g/dL (ref 3.5–5.0)
Alkaline Phosphatase: 59 U/L (ref 38–126)
Anion gap: 12 (ref 5–15)
BUN: 14 mg/dL (ref 6–20)
CO2: 18 mmol/L — ABNORMAL LOW (ref 22–32)
Calcium: 8.9 mg/dL (ref 8.9–10.3)
Chloride: 106 mmol/L (ref 98–111)
Creatinine, Ser: 0.75 mg/dL (ref 0.44–1.00)
GFR calc Af Amer: >60 mL/min (ref >60)
GFR calc non Af Amer: >60 mL/min (ref >60)
Glucose, Bld: 132 mg/dL — ABNORMAL HIGH (ref 70–99)
Potassium: 2.7 mmol/L — CL (ref 3.5–5.1)
Sodium: 136 mmol/L (ref 135–145)
Total Bilirubin: 1.4 mg/dL — ABNORMAL HIGH (ref 0.3–1.2)
Total Protein: 7.5 g/dL (ref 6.5–8.1)

## 2019-04-17 LAB — ETHANOL: Alcohol, Ethyl (B): <10 mg/dL (ref <10)

## 2019-04-17 LAB — LIPASE, BLOOD: Lipase: 25 U/L (ref 11–51)

## 2019-04-17 MED ORDER — DIPHENHYDRAMINE HCL 50 MG/ML IJ SOLN
25.0000 mg | Freq: Once | INTRAMUSCULAR | Status: AC
  Administered 2019-04-17: 14:00:00 25 mg via INTRAVENOUS
  Filled 2019-04-17: qty 1

## 2019-04-17 MED ORDER — METOCLOPRAMIDE HCL 5 MG/ML IJ SOLN
10.0000 mg | Freq: Once | INTRAMUSCULAR | Status: AC
  Administered 2019-04-17: 14:00:00 10 mg via INTRAVENOUS
  Filled 2019-04-17: qty 2

## 2019-04-17 MED ORDER — SODIUM CHLORIDE 0.9 % IV BOLUS
1000.0000 mL | Freq: Once | INTRAVENOUS | Status: AC
  Administered 2019-04-17: 15:00:00 1000 mL via INTRAVENOUS

## 2019-04-17 NOTE — ED Provider Notes (Signed)
[facility] EMERGENCY DEPARTMENT Provider Note   CSN: [id number] Arrival date & time: 04/17/19  1335     History   Chief Complaint Chief Complaint  Patient presents with  . Emesis    HPI Amanda Carrillo is a 43 y.o. female with a history of depression, polysubstance abuse including methadone, heroin and alcohol, gastroparesis, anxiety presenting with a 2 to 3-day history of epigastric abdominal pain in association with nausea and vomiting.  She reports feeling weak and dehydrated, has been unable to keep down any p.o. intake x2 days.  She last used methadone yesterday, denies EtOH use.  She also reports on menses, heavy with associated low back cramping.  She denies fevers, chills, diarrhea.  Denies hematemesis.  Was given zofran per ems en route without nausea relief.  She took a protonix prior to arrival also without sx improvement.     The history is provided by the patient.  Emesis Associated symptoms: abdominal pain   Associated symptoms: no arthralgias, no chills, no diarrhea, no fever, no headaches and no sore throat     Past Medical History:  Diagnosis Date  . ADHD (attention deficit hyperactivity disorder)   . Anxiety   . Depression   . Gastroparesis 01/2017  . Polysubstance abuse (HCC)    herion, methadone, etoh    Patient Active Problem List   Diagnosis Date Noted  . Cocaine use disorder, moderate, dependence (HCC) 11/19/2017  . Bipolar 1 disorder, mixed, moderate (HCC) 11/05/2017  . MDD (major depressive disorder), severe (HCC) 11/04/2017  . Opioid use disorder, severe, dependence (HCC) 08/07/2017  . Opioid use disorder (HCC) 08/04/2017  . Depression     Past Surgical History:  Procedure Laterality Date  . CHOLECYSTECTOMY    . TUBAL LIGATION    . TUBAL LIGATION       OB History    Gravida  3   Para  2   Term  2   Preterm      AB  1   Living        SAB      TAB  1   Ectopic      Multiple      Live Births               Home  Medications    Prior to Admission medications   Medication Sig Start Date End Date Taking? Authorizing Provider  cephALEXin (KEFLEX) 500 MG capsule 1 po qid with food 09/11/18   Ivery Quale, PA-C  divalproex (DEPAKOTE) 250 MG DR tablet Take 1 tablet (250 mg) by mouth in the morning & 2 Tablets (500 mg) at bedtime: For mood stabilization 11/26/17   Nwoko, Nicole Kindred I, NP  fluticasone (FLONASE) 50 MCG/ACT nasal spray Place 2 sprays into both nostrils daily. For allergies 11/27/17   Armandina Stammer I, NP  gabapentin (NEURONTIN) 400 MG capsule Take 1 capsule (400 mg total) by mouth 4 (four) times daily - after meals and at bedtime. For agitation/substance withdrawal symptoms 11/26/17   Armandina Stammer I, NP  hydrOXYzine (ATARAX/VISTARIL) 50 MG tablet Take 1 tablet (50 mg total) by mouth every 6 (six) hours as needed for anxiety (Sleep). 11/26/17   Armandina Stammer I, NP  mirtazapine (REMERON) 15 MG tablet Take 1 tablet (15 mg total) by mouth at bedtime. For depression/sleep 11/26/17   Armandina Stammer I, NP  naltrexone (DEPADE) 50 MG tablet Take 1 tablet (50 mg total) by mouth daily. For Cocaine cravings 11/27/17   Nwoko,  Loleta Dicker, NP  nicotine (NICODERM CQ - DOSED IN MG/24 HOURS) 21 mg/24hr patch Place 1 patch (21 mg total) onto the skin daily. (May purchase from over the counter): For smoking cessation 11/27/17   Lindell Spar I, NP  OLANZapine (ZYPREXA) 10 MG tablet Take 1 tablet (10 mg total) by mouth at bedtime. For mood control 11/26/17   Lindell Spar I, NP  traZODone (DESYREL) 150 MG tablet Take 1 tablet (150 mg total) by mouth at bedtime. For sleep 11/26/17   Encarnacion Slates, NP    Family History Family History  Problem Relation Age of Onset  . Pneumonia Other   . Depression Mother     Social History Social History   Tobacco Use  . Smoking status: Current Every Day Smoker    Packs/day: 1.00    Years: 15.00    Pack years: 15.00    Types: Cigarettes  . Smokeless tobacco: Never Used  Substance Use Topics  .  Alcohol use: Not Currently    Alcohol/week: 8.0 standard drinks    Types: 8 Cans of beer per week    Comment: 8 beers every day  . Drug use: Not Currently    Types: Methamphetamines    Comment: did meth two days ago 04/17/19     Allergies   Toradol [ketorolac tromethamine]   Review of Systems Review of Systems  Constitutional: Negative for chills and fever.  HENT: Negative for congestion and sore throat.   Eyes: Negative.   Respiratory: Negative for chest tightness and shortness of breath.   Cardiovascular: Negative for chest pain.  Gastrointestinal: Positive for abdominal pain, nausea and vomiting. Negative for diarrhea.  Genitourinary: Positive for menstrual problem. Negative for dysuria.  Musculoskeletal: Negative for arthralgias, joint swelling and neck pain.  Skin: Negative.  Negative for rash and wound.  Neurological: Positive for weakness. Negative for dizziness, light-headedness, numbness and headaches.  Psychiatric/Behavioral: Negative.      Physical Exam Updated Vital Signs BP (!) 153/82 (BP Location: Left Arm)   Pulse (!) 53   Temp 97.6 F (36.4 C) (Oral)   Resp 16   Ht 5\' 9"  (1.753 m)   Wt 63.5 kg   SpO2 100%   BMI 20.67 kg/m   Physical Exam Vitals signs and nursing note reviewed.  Constitutional:      Appearance: She is well-developed.  HENT:     Head: Normocephalic and atraumatic.     Mouth/Throat:     Mouth: Mucous membranes are dry.     Comments: Edentulous. Eyes:     Conjunctiva/sclera: Conjunctivae normal.  Neck:     Musculoskeletal: Normal range of motion.  Cardiovascular:     Rate and Rhythm: Normal rate and regular rhythm.     Heart sounds: Normal heart sounds.  Pulmonary:     Effort: Pulmonary effort is normal.     Breath sounds: Normal breath sounds. No wheezing.  Abdominal:     General: Bowel sounds are normal. There is no distension.     Palpations: Abdomen is soft. There is no mass.     Tenderness: There is abdominal tenderness  in the epigastric area. There is no guarding.  Musculoskeletal: Normal range of motion.  Skin:    General: Skin is warm and dry.  Neurological:     Mental Status: She is alert.      ED Treatments / Results  Labs (all labs ordered are listed, but only abnormal results are displayed) Labs Reviewed  CBC WITH DIFFERENTIAL/PLATELET  COMPREHENSIVE METABOLIC PANEL  LIPASE, BLOOD  URINALYSIS, ROUTINE W REFLEX MICROSCOPIC  RAPID URINE DRUG SCREEN, HOSP PERFORMED  ETHANOL  POC URINE PREG, ED    EKG None  Radiology No results found.  Procedures Procedures (including critical care time)  Medications Ordered in ED Medications  metoCLOPramide (REGLAN) injection 10 mg (10 mg Intravenous Given 04/17/19 1420)  diphenhydrAMINE (BENADRYL) injection 25 mg (25 mg Intravenous Given 04/17/19 1420)  sodium chloride 0.9 % bolus 1,000 mL (1,000 mLs Intravenous New Bag/Given 04/17/19 1440)     Initial Impression / Assessment and Plan / ED Course  I have reviewed the triage vital signs and the nursing notes.  Pertinent labs & imaging results that were available during my care of the patient were reviewed by me and considered in my medical decision making (see chart for details).        Pt with history of polysubstance abuse and gastroparesis. Appears dehydrated, uncomfortable.  She was given IV fluids, reglan and benadryl given for suspected gastroparesis induced sx.  She was sleeping comfortably at recheck.    3:17 PM advised by pt that pt had left  (El[facility] without telling anyone).   Final Clinical Impressions(s) / ED Diagnoses   Final diagnoses:  Epigastric pain  Intractable vomiting with nausea, unspecified vomiting type    ED Discharge Orders    None       Victoriano Laindol, Eric Morganti, PA-C 04/17/19 1517    Samuel JesterMcManus, Kathleen, DO 04/21/19 1544

## 2019-04-17 NOTE — ED Notes (Signed)
CRITICAL VALUE ALERT  Critical Value:  K 2.7  Date & Time Notied:  04/17/2019, 4854  Provider Notified: Dr. Lacinda Axon  Orders Received/Actions taken: see chart

## 2019-04-17 NOTE — ED Notes (Signed)
Called pt to call in prescription but no answer.  Left message for pt to call charge RN back asap.

## 2019-04-17 NOTE — ED Triage Notes (Addendum)
Pt c/o n/v with abdominal pain x 2 days.  HX of gastroparesis. Out of meds.    Pt did meth yesterday.  Took a friends protonix today with no relief.    zofran given via EMS en route

## 2019-04-18 NOTE — ED Notes (Signed)
Pt called back stating they were returning a call for a prescription from the ED. Review of records shows Lana Fish RN attempted to call pt but there was no answer. No record of what prescription the pt was supposed to have called in and neither provider pt saw yesterday is here today.

## 2019-07-02 ENCOUNTER — Emergency Department (HOSPITAL_COMMUNITY): Payer: Self-pay

## 2019-07-02 ENCOUNTER — Encounter (HOSPITAL_COMMUNITY): Payer: Self-pay | Admitting: Emergency Medicine

## 2019-07-02 ENCOUNTER — Other Ambulatory Visit: Payer: Self-pay

## 2019-07-02 ENCOUNTER — Emergency Department (HOSPITAL_COMMUNITY)
Admission: EM | Admit: 2019-07-02 | Discharge: 2019-07-02 | Discharge status: Against Medical Advice | Payer: Self-pay | Attending: Emergency Medicine | Admitting: Emergency Medicine

## 2019-07-02 DIAGNOSIS — Z79899 Other long term (current) drug therapy: Secondary | ICD-10-CM | POA: Insufficient documentation

## 2019-07-02 DIAGNOSIS — Y939 Activity, unspecified: Secondary | ICD-10-CM | POA: Insufficient documentation

## 2019-07-02 DIAGNOSIS — Y929 Unspecified place or not applicable: Secondary | ICD-10-CM | POA: Insufficient documentation

## 2019-07-02 DIAGNOSIS — S0083XA Contusion of other part of head, initial encounter: Secondary | ICD-10-CM | POA: Insufficient documentation

## 2019-07-02 DIAGNOSIS — F329 Major depressive disorder, single episode, unspecified: Secondary | ICD-10-CM | POA: Insufficient documentation

## 2019-07-02 DIAGNOSIS — F32A Depression, unspecified: Secondary | ICD-10-CM

## 2019-07-02 DIAGNOSIS — F17210 Nicotine dependence, cigarettes, uncomplicated: Secondary | ICD-10-CM | POA: Insufficient documentation

## 2019-07-02 DIAGNOSIS — Z885 Allergy status to narcotic agent status: Secondary | ICD-10-CM | POA: Insufficient documentation

## 2019-07-02 DIAGNOSIS — Y999 Unspecified external cause status: Secondary | ICD-10-CM | POA: Insufficient documentation

## 2019-07-02 DIAGNOSIS — M546 Pain in thoracic spine: Secondary | ICD-10-CM | POA: Insufficient documentation

## 2019-07-02 MED ORDER — TETRACAINE HCL 0.5 % OP SOLN
2.0000 [drp] | Freq: Once | OPHTHALMIC | Status: DC
  Filled 2019-07-02: qty 4

## 2019-07-02 MED ORDER — FLUORESCEIN SODIUM 1 MG OP STRP
1.0000 | ORAL_STRIP | Freq: Once | OPHTHALMIC | Status: DC
  Filled 2019-07-02: qty 1
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
## 2019-07-02 NOTE — ED Notes (Signed)
Pt spoke with EDP and was informed by EDP that pt was leaving AMA.  Pt signed AMA form and ambulated out of dept with her belongings with a steady gait.

## 2019-07-02 NOTE — ED Notes (Signed)
Pt denies SI at present, pt states she is wanting to go home and needing to tend to her dogs at her house because she hasn't been there in past 2 days.  Pt noted with swelling to left cheek and bruising noted to face.

## 2019-07-06 ENCOUNTER — Emergency Department (HOSPITAL_COMMUNITY): Payer: Self-pay

## 2019-07-06 ENCOUNTER — Encounter (HOSPITAL_COMMUNITY): Payer: Self-pay | Admitting: Emergency Medicine

## 2019-07-06 ENCOUNTER — Emergency Department (HOSPITAL_COMMUNITY)
Admission: EM | Admit: 2019-07-06 | Discharge: 2019-07-08 | Disposition: A | Discharge status: Home / Self Care | Payer: Self-pay | Attending: Emergency Medicine | Admitting: Emergency Medicine

## 2019-07-06 ENCOUNTER — Other Ambulatory Visit: Payer: Self-pay

## 2019-07-06 DIAGNOSIS — S60211A Contusion of right wrist, initial encounter: Secondary | ICD-10-CM | POA: Insufficient documentation

## 2019-07-06 DIAGNOSIS — Y929 Unspecified place or not applicable: Secondary | ICD-10-CM | POA: Insufficient documentation

## 2019-07-06 DIAGNOSIS — T07XXXA Unspecified multiple injuries, initial encounter: Secondary | ICD-10-CM

## 2019-07-06 DIAGNOSIS — Z79899 Other long term (current) drug therapy: Secondary | ICD-10-CM | POA: Insufficient documentation

## 2019-07-06 DIAGNOSIS — Y939 Activity, unspecified: Secondary | ICD-10-CM | POA: Insufficient documentation

## 2019-07-06 DIAGNOSIS — F332 Major depressive disorder, recurrent severe without psychotic features: Secondary | ICD-10-CM | POA: Insufficient documentation

## 2019-07-06 DIAGNOSIS — Y999 Unspecified external cause status: Secondary | ICD-10-CM | POA: Insufficient documentation

## 2019-07-06 DIAGNOSIS — S60212A Contusion of left wrist, initial encounter: Secondary | ICD-10-CM | POA: Insufficient documentation

## 2019-07-06 DIAGNOSIS — F17210 Nicotine dependence, cigarettes, uncomplicated: Secondary | ICD-10-CM | POA: Insufficient documentation

## 2019-07-06 DIAGNOSIS — S8011XA Contusion of right lower leg, initial encounter: Secondary | ICD-10-CM | POA: Insufficient documentation

## 2019-07-06 DIAGNOSIS — Z20828 Contact with and (suspected) exposure to other viral communicable diseases: Secondary | ICD-10-CM | POA: Insufficient documentation

## 2019-07-06 DIAGNOSIS — S0231XA Fracture of orbital floor, right side, initial encounter for closed fracture: Secondary | ICD-10-CM

## 2019-07-06 DIAGNOSIS — F1520 Other stimulant dependence, uncomplicated: Secondary | ICD-10-CM | POA: Insufficient documentation

## 2019-07-06 DIAGNOSIS — S3991XA Unspecified injury of abdomen, initial encounter: Secondary | ICD-10-CM | POA: Insufficient documentation

## 2019-07-06 LAB — CBC WITH DIFFERENTIAL/PLATELET
Abs Immature Granulocytes: 0.03 10*3/uL (ref 0.00–0.07)
Basophils Absolute: 0.1 10*3/uL (ref 0.0–0.1)
Basophils Relative: 0 %
Eosinophils Absolute: 0.1 10*3/uL (ref 0.0–0.5)
Eosinophils Relative: 1 %
HCT: 41.8 % (ref 36.0–46.0)
Hemoglobin: 13.1 g/dL (ref 12.0–15.0)
Immature Granulocytes: 0 %
Lymphocytes Relative: 24 %
Lymphs Abs: 2.7 10*3/uL (ref 0.7–4.0)
MCH: 30.4 pg (ref 26.0–34.0)
MCHC: 31.3 g/dL (ref 30.0–36.0)
MCV: 97 fL (ref 80.0–100.0)
Monocytes Absolute: 0.6 10*3/uL (ref 0.1–1.0)
Monocytes Relative: 6 %
Neutro Abs: 7.7 10*3/uL (ref 1.7–7.7)
Neutrophils Relative %: 69 %
Platelets: 220 10*3/uL (ref 150–400)
RBC: 4.31 MIL/uL (ref 3.87–5.11)
RDW: 13.1 % (ref 11.5–15.5)
WBC: 11.2 10*3/uL — ABNORMAL HIGH (ref 4.0–10.5)
nRBC: 0 % (ref 0.0–0.2)

## 2019-07-06 LAB — RAPID URINE DRUG SCREEN, HOSP PERFORMED
Amphetamines: POSITIVE — AB
Barbiturates: NOT DETECTED
Benzodiazepines: NOT DETECTED
Cocaine: NOT DETECTED
Opiates: NOT DETECTED
Tetrahydrocannabinol: NOT DETECTED

## 2019-07-06 LAB — URINALYSIS, ROUTINE W REFLEX MICROSCOPIC
Bilirubin Urine: NEGATIVE
Glucose, UA: NEGATIVE mg/dL
Ketones, ur: NEGATIVE mg/dL
Nitrite: NEGATIVE
Protein, ur: NEGATIVE mg/dL
Specific Gravity, Urine: 1.004 — ABNORMAL LOW (ref 1.005–1.030)
pH: 6 (ref 5.0–8.0)

## 2019-07-06 LAB — I-STAT BETA HCG BLOOD, ED (MC, WL, AP ONLY): I-stat hCG, quantitative: <5 m[IU]/mL (ref <5)

## 2019-07-06 LAB — POC URINE PREG, ED: Preg Test, Ur: NEGATIVE

## 2019-07-06 LAB — ETHANOL: Alcohol, Ethyl (B): <10 mg/dL (ref <10)

## 2019-07-06 LAB — BASIC METABOLIC PANEL
Anion gap: 9 (ref 5–15)
BUN: 10 mg/dL (ref 6–20)
CO2: 26 mmol/L (ref 22–32)
Calcium: 9.2 mg/dL (ref 8.9–10.3)
Chloride: 106 mmol/L (ref 98–111)
Creatinine, Ser: 0.74 mg/dL (ref 0.44–1.00)
GFR calc Af Amer: >60 mL/min (ref >60)
GFR calc non Af Amer: >60 mL/min (ref >60)
Glucose, Bld: 143 mg/dL — ABNORMAL HIGH (ref 70–99)
Potassium: 4.3 mmol/L (ref 3.5–5.1)
Sodium: 141 mmol/L (ref 135–145)

## 2019-07-06 LAB — VALPROIC ACID LEVEL: Valproic Acid Lvl: <10 ug/mL — ABNORMAL LOW (ref 50.0–100.0)

## 2019-07-06 MED ORDER — MIRTAZAPINE 15 MG PO TABS
15.0000 mg | ORAL_TABLET | Freq: Every day | ORAL | Status: DC
  Administered 2019-07-06 – 2019-07-07 (×2): 15 mg via ORAL
  Filled 2019-07-06 (×2): qty 1

## 2019-07-06 MED ORDER — ZIPRASIDONE MESYLATE 20 MG IM SOLR
10.0000 mg | Freq: Once | INTRAMUSCULAR | Status: AC
  Administered 2019-07-06: 10 mg via INTRAMUSCULAR
  Filled 2019-07-06: qty 20

## 2019-07-06 MED ORDER — ACETAMINOPHEN 325 MG PO TABS
650.0000 mg | ORAL_TABLET | Freq: Once | ORAL | Status: AC
  Administered 2019-07-06: 650 mg via ORAL
  Filled 2019-07-06: qty 2

## 2019-07-06 MED ORDER — TRAZODONE HCL 50 MG PO TABS
150.0000 mg | ORAL_TABLET | Freq: Every day | ORAL | Status: DC
  Administered 2019-07-06 – 2019-07-07 (×2): 150 mg via ORAL
  Filled 2019-07-06 (×2): qty 3

## 2019-07-06 MED ORDER — LORAZEPAM 2 MG/ML IJ SOLN
2.0000 mg | Freq: Once | INTRAMUSCULAR | Status: AC
  Administered 2019-07-06: 2 mg via INTRAVENOUS
  Filled 2019-07-06: qty 1

## 2019-07-06 MED ORDER — GABAPENTIN 400 MG PO CAPS
400.0000 mg | ORAL_CAPSULE | Freq: Three times a day (TID) | ORAL | Status: DC
  Administered 2019-07-06 – 2019-07-07 (×5): 400 mg via ORAL
  Filled 2019-07-06 (×5): qty 1

## 2019-07-06 MED ORDER — IOHEXOL 300 MG/ML  SOLN
100.0000 mL | Freq: Once | INTRAMUSCULAR | Status: AC | PRN
  Administered 2019-07-06: 100 mL via INTRAVENOUS

## 2019-07-06 MED ORDER — FLUORESCEIN SODIUM 1 MG OP STRP
1.0000 | ORAL_STRIP | Freq: Once | OPHTHALMIC | Status: AC
  Administered 2019-07-06: 1 via OPHTHALMIC
  Filled 2019-07-06: qty 1

## 2019-07-06 MED ORDER — HYDROXYZINE HCL 25 MG PO TABS: 50.0000 mg | ORAL_TABLET | Freq: Four times a day (QID) | ORAL | Status: DC | PRN

## 2019-07-06 MED ORDER — CEPHALEXIN 500 MG PO CAPS: 500.0000 mg | ORAL_CAPSULE | Freq: Three times a day (TID) | ORAL | 0 refills | Status: DC

## 2019-07-06 MED ORDER — TETRACAINE HCL 0.5 % OP SOLN
2.0000 [drp] | Freq: Once | OPHTHALMIC | Status: AC
  Administered 2019-07-06: 2 [drp] via OPHTHALMIC
  Filled 2019-07-06: qty 4

## 2019-07-06 MED ORDER — OLANZAPINE 5 MG PO TABS
10.0000 mg | ORAL_TABLET | Freq: Every day | ORAL | Status: DC
  Administered 2019-07-06 – 2019-07-07 (×2): 10 mg via ORAL
  Filled 2019-07-06 (×2): qty 2

## 2019-07-06 MED ORDER — DIVALPROEX SODIUM 250 MG PO DR TAB
500.0000 mg | DELAYED_RELEASE_TABLET | Freq: Every day | ORAL | Status: DC
  Administered 2019-07-06 – 2019-07-07 (×2): 500 mg via ORAL
  Filled 2019-07-06 (×2): qty 2

## 2019-07-06 MED ORDER — LORAZEPAM 2 MG/ML IJ SOLN: 1.0000 mg | Freq: Once | INTRAMUSCULAR | Status: DC

## 2019-07-06 MED ORDER — ERYTHROMYCIN 5 MG/GM OP OINT: TOPICAL_OINTMENT | OPHTHALMIC | 0 refills | Status: DC

## 2019-07-06 MED ORDER — STERILE WATER FOR INJECTION IJ SOLN
INTRAMUSCULAR | Status: AC
  Filled 2019-07-06: qty 10
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
## 2019-07-06 NOTE — ED Notes (Signed)
[place] [employer] at bedside.  

## 2019-07-06 NOTE — ED Notes (Signed)
Pt transported to radiology.
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
## 2019-07-06 NOTE — ED Notes (Signed)
Pt refused x-ray, MD notified. Pt continually cursing at staff, pt asked to leave, pt stated if we made her leave she would kill herself, pt changed into wine paper scrubs, wanded, belongings locked away & sitter at bedside. Pt resting calmly at this time.

## 2019-07-07 ENCOUNTER — Encounter (HOSPITAL_COMMUNITY): Payer: Self-pay | Admitting: Clinical

## 2019-07-07 NOTE — ED Notes (Signed)
Pt is asleep
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
## 2019-07-07 NOTE — B[facility] Counselor (Signed)
Per RN patient continues to sleep and will not wake up for assessment. Per chart review patient given medication for sedation late last night. TTS will complete assessment when alert.

## 2019-07-07 NOTE — [facility] Counselor (Signed)
Per Darnelle Bos, RN patient continues to sleep. She states that she is able to get patient to open her eyes but she is not able to stay awake. TTS to assess at a later time.

## 2019-07-07 NOTE — ED Notes (Signed)
Pt in the shower 

## 2019-07-07 NOTE — ED Notes (Signed)
IVC paperwork faxed to [employer] and to [employer] at this time.

## 2019-07-07 NOTE — ED Notes (Signed)
Pt is awake and eating lunch.

## 2019-07-07 NOTE — ED Notes (Signed)
Pt cussing at staff because "I need to take a fucking shower, this is bullshit." Advised pt that if she could ask in a nice manner she would be allowed to take a shower. Pt continues to holler and cuss at staff. Pt stands from bed and states "Im fucking leaving." Dr. Wyvonnia Dusky notified. Rancour to IVC pt at this time.

## 2019-07-07 NOTE — ED Notes (Signed)
Pt given meal tray to eat.

## 2019-07-07 NOTE — ED Notes (Signed)
Spoke with pt and she is ok with giving information to Tonda Wiederhold (brother).  Amanda Carrillo called from [phone] and informed me that pt was scheduled to go into a year rehab at [facility] [phone]) on Tuesday.

## 2019-07-07 NOTE — ED Notes (Signed)
Pt is more awake now and asking questions about Speaking to [facility] from [place] [phone]) where she was to placed for one year of rehab.  Informed pt that she can call them in the am I see if her bed is still available, but first she had to be cleared by [facility] before calling.

## 2019-07-07 NOTE — [facility] Counselor (Signed)
Disposition: Letitia Libra, FNP recommends patient be observed overnight for safety and stabilization as she does not appear entirely sober. Psychiatry will re-assess tomorrow and potentially discharge.

## 2019-07-08 LAB — POC SARS CORONAVIRUS 2 AG -  ED: SARS Coronavirus 2 Ag: NEGATIVE

## 2019-07-08 NOTE — Consult Note (Signed)
Patient psych cleared at this time. Patient seen this morning, she seemed to brighter affect, endorsing resolution of her auditory hallucinations and suicidal ideation. Patient seems very motivated with discharge and seems to be minimizing presenting symptoms. It is noted that she has significant facial swelling and bruising bilateral periorbital spaces and conjunctival hemorrhage to the right eye noted via teleassessment. Upon admission to the ED she appeared to have some disorganized thought processes, which was felt to be related to substance induced related. Upon evaluation patient was alert and oriented, calm and cooperative. Her speech is fluent and thought process are normal at this time. She reports being accepted into a [place] year facility [place] for substance abuse. She states she has to go to church by 1 pm at the facility. It is unclear if the patient is telling the truth or not as it appears in the chart review that she is yelling obscenities and very argumentive with staff. She maybe craving and rushing her discharge in order to get out and use again. She is denying si/hi/avh. She does not appear to be responding to internal stimuli at this time.

## 2019-07-08 NOTE — ED Notes (Signed)
Pt upset and wants to leave.  Informed pt that behavorial health needs to re assess her before she can leave.  Shortly after pt came out into hallway with top and only underwear on yelling saying she want her fucking clothes.  Security called to the bedside.  edp notified.

## 2019-07-08 NOTE — ED Notes (Signed)
Pt in the room yelling out obscenities at staff.

## 2019-07-09 ENCOUNTER — Inpatient Hospital Stay (HOSPITAL_COMMUNITY)
Admission: AD | Admit: 2019-07-09 | Discharge: 2019-07-13 | DRG: 885 | Disposition: A | Discharge status: Home / Self Care | Payer: No Typology Code available for payment source | Source: Intra-hospital | Attending: Psychiatry | Admitting: Psychiatry

## 2019-07-09 DIAGNOSIS — Z23 Encounter for immunization: Secondary | ICD-10-CM

## 2019-07-09 DIAGNOSIS — K219 Gastro-esophageal reflux disease without esophagitis: Secondary | ICD-10-CM | POA: Diagnosis present

## 2019-07-09 DIAGNOSIS — F1120 Opioid dependence, uncomplicated: Secondary | ICD-10-CM | POA: Diagnosis present

## 2019-07-09 DIAGNOSIS — Z20828 Contact with and (suspected) exposure to other viral communicable diseases: Secondary | ICD-10-CM | POA: Diagnosis present

## 2019-07-09 DIAGNOSIS — F319 Bipolar disorder, unspecified: Secondary | ICD-10-CM | POA: Diagnosis present

## 2019-07-09 DIAGNOSIS — F1990 Other psychoactive substance use, unspecified, uncomplicated: Secondary | ICD-10-CM | POA: Diagnosis not present

## 2019-07-09 DIAGNOSIS — G4700 Insomnia, unspecified: Secondary | ICD-10-CM | POA: Diagnosis present

## 2019-07-09 DIAGNOSIS — F1420 Cocaine dependence, uncomplicated: Secondary | ICD-10-CM | POA: Diagnosis present

## 2019-07-09 DIAGNOSIS — R45851 Suicidal ideations: Secondary | ICD-10-CM | POA: Diagnosis present

## 2019-07-09 DIAGNOSIS — F3162 Bipolar disorder, current episode mixed, moderate: Principal | ICD-10-CM | POA: Diagnosis present

## 2019-07-09 DIAGNOSIS — F17210 Nicotine dependence, cigarettes, uncomplicated: Secondary | ICD-10-CM | POA: Diagnosis present

## 2019-07-09 MED ORDER — TRAZODONE HCL 50 MG PO TABS
50.0000 mg | ORAL_TABLET | Freq: Every evening | ORAL | Status: DC | PRN
  Administered 2019-07-10: 50 mg via ORAL
  Filled 2019-07-09 (×2): qty 1

## 2019-07-09 MED ORDER — HYDROXYZINE HCL 25 MG PO TABS
25.0000 mg | ORAL_TABLET | Freq: Three times a day (TID) | ORAL | Status: DC | PRN
  Administered 2019-07-10 – 2019-07-12 (×6): 25 mg via ORAL
  Filled 2019-07-09 (×6): qty 1

## 2019-07-09 MED ORDER — MAGNESIUM HYDROXIDE 400 MG/5ML PO SUSP: 30.0000 mL | Freq: Every day | ORAL | Status: DC | PRN

## 2019-07-09 MED ORDER — ACETAMINOPHEN 325 MG PO TABS
650.0000 mg | ORAL_TABLET | Freq: Four times a day (QID) | ORAL | Status: DC | PRN
  Administered 2019-07-10: 650 mg via ORAL
  Filled 2019-07-09: qty 2

## 2019-07-09 MED ORDER — ALUM & MAG HYDROXIDE-SIMETH 200-200-20 MG/5ML PO SUSP: 30.0000 mL | ORAL | Status: DC | PRN
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
## 2019-07-10 ENCOUNTER — Encounter (HOSPITAL_COMMUNITY): Payer: Self-pay | Admitting: *Deleted

## 2019-07-10 ENCOUNTER — Other Ambulatory Visit: Payer: Self-pay

## 2019-07-10 DIAGNOSIS — F1990 Other psychoactive substance use, unspecified, uncomplicated: Secondary | ICD-10-CM

## 2019-07-10 DIAGNOSIS — F319 Bipolar disorder, unspecified: Secondary | ICD-10-CM

## 2019-07-10 LAB — CBC
HCT: 38.7 % (ref 36.0–46.0)
Hemoglobin: 12.2 g/dL (ref 12.0–15.0)
MCH: 30.5 pg (ref 26.0–34.0)
MCHC: 31.5 g/dL (ref 30.0–36.0)
MCV: 96.8 fL (ref 80.0–100.0)
Platelets: 186 10*3/uL (ref 150–400)
RBC: 4 MIL/uL (ref 3.87–5.11)
RDW: 12.8 % (ref 11.5–15.5)
WBC: 8.6 10*3/uL (ref 4.0–10.5)
nRBC: 0 % (ref 0.0–0.2)

## 2019-07-10 LAB — COMPREHENSIVE METABOLIC PANEL
ALT: 41 U/L (ref 0–44)
AST: 27 U/L (ref 15–41)
Albumin: 3.6 g/dL (ref 3.5–5.0)
Alkaline Phosphatase: 67 U/L (ref 38–126)
Anion gap: 8 (ref 5–15)
BUN: 11 mg/dL (ref 6–20)
CO2: 29 mmol/L (ref 22–32)
Calcium: 9.3 mg/dL (ref 8.9–10.3)
Chloride: 103 mmol/L (ref 98–111)
Creatinine, Ser: 0.78 mg/dL (ref 0.44–1.00)
GFR calc Af Amer: >60 mL/min (ref >60)
GFR calc non Af Amer: >60 mL/min (ref >60)
Glucose, Bld: 111 mg/dL — ABNORMAL HIGH (ref 70–99)
Potassium: 4.6 mmol/L (ref 3.5–5.1)
Sodium: 140 mmol/L (ref 135–145)
Total Bilirubin: 0.6 mg/dL (ref 0.3–1.2)
Total Protein: 6.9 g/dL (ref 6.5–8.1)

## 2019-07-10 LAB — HEMOGLOBIN A1C
Hgb A1c MFr Bld: 5.2 % (ref 4.8–5.6)
Mean Plasma Glucose: 102.54 mg/dL

## 2019-07-10 LAB — RAPID URINE DRUG SCREEN, HOSP PERFORMED
Amphetamines: NOT DETECTED
Barbiturates: NOT DETECTED
Benzodiazepines: NOT DETECTED
Cocaine: NOT DETECTED
Opiates: NOT DETECTED
Tetrahydrocannabinol: NOT DETECTED

## 2019-07-10 LAB — LIPID PANEL
Cholesterol: 143 mg/dL (ref 0–200)
HDL: 40 mg/dL — ABNORMAL LOW (ref >40)
LDL Cholesterol: 54 mg/dL (ref 0–99)
Total CHOL/HDL Ratio: 3.6 RATIO
Triglycerides: 243 mg/dL — ABNORMAL HIGH (ref <150)
VLDL: 49 mg/dL — ABNORMAL HIGH (ref 0–40)

## 2019-07-10 LAB — SARS CORONAVIRUS 2 BY RT PCR (HOSPITAL ORDER, PERFORMED IN ~~LOC~~ HOSPITAL LAB): SARS Coronavirus 2: NEGATIVE

## 2019-07-10 LAB — TSH: TSH: 3.499 u[IU]/mL (ref 0.350–4.500)

## 2019-07-10 LAB — PREGNANCY, URINE: Preg Test, Ur: NEGATIVE

## 2019-07-10 MED ORDER — IBUPROFEN 600 MG PO TABS
600.0000 mg | ORAL_TABLET | Freq: Four times a day (QID) | ORAL | Status: DC | PRN
  Administered 2019-07-11 – 2019-07-12 (×2): 600 mg via ORAL
  Filled 2019-07-10 (×2): qty 1

## 2019-07-10 MED ORDER — DIVALPROEX SODIUM 250 MG PO DR TAB
250.0000 mg | DELAYED_RELEASE_TABLET | Freq: Every day | ORAL | Status: DC
  Administered 2019-07-10 – 2019-07-13 (×4): 250 mg via ORAL
  Filled 2019-07-10 (×6): qty 1

## 2019-07-10 MED ORDER — NICOTINE 21 MG/24HR TD PT24
21.0000 mg | MEDICATED_PATCH | Freq: Every day | TRANSDERMAL | Status: DC
  Administered 2019-07-10 – 2019-07-13 (×4): 21 mg via TRANSDERMAL
  Filled 2019-07-10 (×5): qty 1

## 2019-07-10 MED ORDER — INFLUENZA VAC SPLIT QUAD 0.5 ML IM SUSY
0.5000 mL | PREFILLED_SYRINGE | INTRAMUSCULAR | Status: AC
  Administered 2019-07-11: 0.5 mL via INTRAMUSCULAR
  Filled 2019-07-10: qty 0.5

## 2019-07-10 MED ORDER — ERYTHROMYCIN 5 MG/GM OP OINT
TOPICAL_OINTMENT | Freq: Four times a day (QID) | OPHTHALMIC | Status: DC
  Administered 2019-07-10: 18:00:00 via OPHTHALMIC
  Administered 2019-07-10: 1 via OPHTHALMIC
  Administered 2019-07-10 – 2019-07-11 (×3): via OPHTHALMIC
  Administered 2019-07-11: 1 via OPHTHALMIC
  Administered 2019-07-11 (×2): via OPHTHALMIC
  Administered 2019-07-12 (×2): 1 via OPHTHALMIC
  Administered 2019-07-12 – 2019-07-13 (×3): via OPHTHALMIC
  Administered 2019-07-13: 1 via OPHTHALMIC
  Filled 2019-07-10 (×2): qty 3.5

## 2019-07-10 MED ORDER — TRAZODONE HCL 50 MG PO TABS
50.0000 mg | ORAL_TABLET | Freq: Every evening | ORAL | Status: DC | PRN
  Administered 2019-07-10 (×2): 50 mg via ORAL
  Filled 2019-07-10: qty 1

## 2019-07-10 MED ORDER — PANTOPRAZOLE SODIUM 40 MG PO TBEC
40.0000 mg | DELAYED_RELEASE_TABLET | Freq: Every day | ORAL | Status: DC
  Administered 2019-07-10 – 2019-07-13 (×4): 40 mg via ORAL
  Filled 2019-07-10 (×6): qty 1

## 2019-07-10 MED ORDER — DIVALPROEX SODIUM 500 MG PO DR TAB
500.0000 mg | DELAYED_RELEASE_TABLET | Freq: Every evening | ORAL | Status: DC
  Administered 2019-07-10 – 2019-07-12 (×3): 500 mg via ORAL
  Filled 2019-07-10 (×4): qty 1

## 2019-07-10 MED ORDER — TRAMADOL HCL 50 MG PO TABS
50.0000 mg | ORAL_TABLET | Freq: Four times a day (QID) | ORAL | Status: DC | PRN
  Administered 2019-07-10 – 2019-07-12 (×6): 50 mg via ORAL
  Filled 2019-07-10 (×6): qty 1

## 2019-07-10 MED ORDER — ACETAMINOPHEN 325 MG PO TABS
325.0000 mg | ORAL_TABLET | Freq: Four times a day (QID) | ORAL | Status: DC | PRN
  Administered 2019-07-11 – 2019-07-12 (×2): 325 mg via ORAL
  Filled 2019-07-10 (×2): qty 1

## 2019-07-10 MED ORDER — GABAPENTIN 300 MG PO CAPS
300.0000 mg | ORAL_CAPSULE | Freq: Three times a day (TID) | ORAL | Status: DC
  Administered 2019-07-10 – 2019-07-13 (×11): 300 mg via ORAL
  Filled 2019-07-10 (×14): qty 1

## 2019-07-10 MED ORDER — FLUTICASONE PROPIONATE 50 MCG/ACT NA SUSP
2.0000 | Freq: Every day | NASAL | Status: DC
  Administered 2019-07-10 – 2019-07-13 (×4): 2 via NASAL
  Filled 2019-07-10 (×2): qty 16

## 2019-07-10 MED ORDER — CEPHALEXIN 500 MG PO CAPS
500.0000 mg | ORAL_CAPSULE | Freq: Three times a day (TID) | ORAL | Status: DC
  Administered 2019-07-10 – 2019-07-13 (×11): 500 mg via ORAL
  Filled 2019-07-10 (×13): qty 1

## 2019-07-10 MED ORDER — OLANZAPINE 10 MG PO TABS
10.0000 mg | ORAL_TABLET | Freq: Every day | ORAL | Status: DC
  Administered 2019-07-10 – 2019-07-12 (×3): 10 mg via ORAL
  Filled 2019-07-10 (×4): qty 1

## 2019-07-10 MED ORDER — TRAMADOL-ACETAMINOPHEN 37.5-325 MG PO TABS: 1.0000 | ORAL_TABLET | Freq: Four times a day (QID) | ORAL | Status: DC | PRN

## 2019-07-10 MED ORDER — MIRTAZAPINE 15 MG PO TABS
15.0000 mg | ORAL_TABLET | Freq: Every day | ORAL | Status: DC
  Administered 2019-07-10 – 2019-07-12 (×3): 15 mg via ORAL
  Filled 2019-07-10 (×5): qty 1
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
## 2019-07-10 NOTE — Progress Notes (Signed)
[place] NOVEL CORONAVIRUS (COVID-19) DAILY CHECK-OFF SYMPTOMS - answer yes or no to each - every day NO YES  Have you had a fever in the past 24 hours?  . Fever (Temp > 37.80C / 100F) X   Have you had any of these symptoms in the past 24 hours? . New Cough .  Sore Throat  .  Shortness of Breath .  Difficulty Breathing .  Unexplained Body Aches   X   Have you had any one of these symptoms in the past 24 hours not related to allergies?   . Runny Nose .  Nasal Congestion .  Sneezing   X   If you have had runny nose, nasal congestion, sneezing in the past 24 hours, has it worsened?  X   EXPOSURES - check yes or no X   Have you traveled outside the state in the past 14 days?  X   Have you been in contact with someone with a confirmed diagnosis of COVID-19 or PUI in the past 14 days without wearing appropriate PPE?  X   Have you been living in the same home as a person with confirmed diagnosis of COVID-19 or a PUI (household contact)?    X   Have you been diagnosed with COVID-19?    X              What to do next: Answered NO to all: Answered YES to anything:   Proceed with unit schedule Follow the [facility] Inpatient Flowsheet.   
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
## 2019-07-10 NOTE — Tx Team (Signed)
Initial Treatment Plan 07/10/2019 3:08 AM Amanda Carrillo [id number]    PATIENT STRESSORS: Financial difficulties Legal issue Marital or family conflict Medication change or noncompliance Substance abuse Traumatic event   PATIENT STRENGTHS: Average or above average intelligence Motivation for treatment/growth Supportive family/friends   PATIENT IDENTIFIED PROBLEMS: Depression  Suicidal Ideation  Substance abuse      "Get off drugs"  "Not be suicidal"  "Get on my own two feet and get my kids and a home"       DISCHARGE CRITERIA:  Adequate post-discharge living arrangements Improved stabilization in mood, thinking, and/or behavior Motivation to continue treatment in a less acute level of care Need for constant or close observation no longer present Verbal commitment to aftercare and medication compliance Withdrawal symptoms are absent or subacute and managed without 24-hour nursing intervention  PRELIMINARY DISCHARGE PLAN: Attend 12-step recovery group Outpatient therapy Placement in alternative living arrangements  PATIENT/FAMILY INVOLVEMENT: This treatment plan has been presented to and reviewed with the patient, Amanda Carrillo.  The patient and family have been given the opportunity to ask questions and make suggestions.  Margaretann Loveless, RN 07/10/2019, 3:08 AM

## 2019-07-10 NOTE — Progress Notes (Signed)
   07/10/19 1800  Psych Admission Type (Psych Patients Only)  Admission Status Voluntary  Psychosocial Assessment  Patient Complaints Anxiety;Depression  Eye Contact Fair  Facial Expression Flat  Affect Appropriate to circumstance  Speech Logical/coherent  Interaction Assertive  Motor Activity Other (Comment) (WDL)  Appearance/Hygiene Disheveled  Behavior Characteristics Cooperative;Appropriate to situation  Thought Process  Coherency WDL  Content WDL  Delusions None reported or observed  Perception WDL  Hallucination None reported or observed  Judgment Impaired  Confusion None  Danger to Self  Current suicidal ideation? Passive  Self-Injurious Behavior No self-injurious ideation or behavior indicators observed or expressed   Agreement Not to Harm Self Yes  Description of Agreement verbal  Danger to Others  Danger to Others None reported or observed
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
## 2019-07-11 MED ORDER — ENSURE ENLIVE PO LIQD
237.0000 mL | Freq: Two times a day (BID) | ORAL | Status: DC
  Administered 2019-07-11 – 2019-07-13 (×6): 237 mL via ORAL
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
## 2019-07-11 NOTE — Plan of Care (Signed)
Nurse discussed anxiety, depression and coping skills with patient.  

## 2019-07-11 NOTE — [facility] Group Notes (Signed)
LCSW Group Therapy Note 07/11/2019 3:06 PM  Type of Therapy and Topic: Group Therapy: Overcoming Obstacles  Participation Level: None  Description of Group:  In this group patients will be encouraged to explore what they see as obstacles to their own wellness and recovery. They will be guided to discuss their thoughts, feelings, and behaviors related to these obstacles. The group will process together ways to cope with barriers, with attention given to specific choices patients can make. Each patient will be challenged to identify changes they are motivated to make in order to overcome their obstacles. This group will be process-oriented, with patients participating in exploration of their own experiences as well as giving and receiving support and challenge from other group members.  Therapeutic Goals: 1. Patient will identify personal and current obstacles as they relate to admission. 2. Patient will identify barriers that currently interfere with their wellness or overcoming obstacles.  3. Patient will identify feelings, thought process and behaviors related to these barriers. 4. Patient will identify two changes they are willing to make to overcome these obstacles:   Summary of Patient Progress  Amanda Carrillo attended the group during the last 10 minutes of the group session. Amanda Carrillo did not contribute to the group's discussion.    Therapeutic Modalities:  Cognitive Behavioral Therapy Solution Focused Therapy Motivational Interviewing Relapse Prevention Therapy   Theresa Duty Clinical Social Worker
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
## 2019-07-11 NOTE — Progress Notes (Signed)
EKG completed and given to MD for review.  
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
## 2019-07-11 NOTE — Progress Notes (Signed)
Recreation Therapy Notes  Date:  11.23.20 Time: 0930 Location: [address] Dayroom  Group Topic: Stress Management  Goal Area(s) Addresses:  Patient will identify positive stress management techniques. Patient will identify benefits of using stress management post d/c.  Behavioral Response: Engaged  Intervention: Stress Management  Activity :  Meditation.  LRT introduced the stress management technique of mountain meditation.  LRT played a meditation that focused on taking in the characteristics of a mountain.  Patients were to listen and follow along as meditation played to fully participate in activity.  Education:  Stress Management, Discharge Planning.   Education Outcome: Acknowledges Education  Clinical Observations/Feedback: Pt attended and participated in activity.    Elli Groesbeck, LRT/CTRS         Kable Haywood A 07/11/2019 11:25 AM 

## 2019-07-11 NOTE — BHH Suicide Risk Assessment (Signed)
[facility] INPATIENT:  Family/Significant Other Suicide Prevention Education  Suicide Prevention Education:   Patient Refusal for Family/Significant Other Suicide Prevention Education: The patient Amanda Carrillo has refused to provide written consent for family/significant other to be provided Family/Significant Other Suicide Prevention Education during admission and/or prior to discharge.  Physician notified.  SPE completed with patient, as patient refused to consent to family contact. SPI pamphlet provided to pt and pt was encouraged to share information with support network, ask questions, and talk about any concerns relating to SPE. Patient denies access to guns/firearms and verbalized understanding of information provided. Mobile Crisis information also provided to patient.    Marylee Floras 07/11/2019, 9:29 AM
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
## 2019-07-12 LAB — VALPROIC ACID LEVEL: Valproic Acid Lvl: 18 ug/mL — ABNORMAL LOW (ref 50.0–100.0)

## 2019-07-12 MED ORDER — ACETAMINOPHEN 325 MG PO TABS
325.0000 mg | ORAL_TABLET | Freq: Three times a day (TID) | ORAL | Status: DC | PRN
  Administered 2019-07-12: 22:00:00 325 mg via ORAL
  Filled 2019-07-12: qty 1

## 2019-07-12 MED ORDER — LORAZEPAM 0.5 MG PO TABS
0.5000 mg | ORAL_TABLET | Freq: Four times a day (QID) | ORAL | Status: DC | PRN
  Administered 2019-07-12 – 2019-07-13 (×4): 0.5 mg via ORAL
  Filled 2019-07-12 (×4): qty 1

## 2019-07-12 MED ORDER — TRAMADOL HCL 50 MG PO TABS
50.0000 mg | ORAL_TABLET | Freq: Three times a day (TID) | ORAL | Status: DC | PRN
  Administered 2019-07-12 – 2019-07-13 (×2): 50 mg via ORAL
  Filled 2019-07-12 (×2): qty 1

## 2019-07-12 NOTE — Plan of Care (Signed)
Anxious and frequently agitated. Expressing concerns  about discharge.
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
## 2019-07-12 NOTE — BHH Group Notes (Signed)
Adult Psychoeducational Group Note  Date:  07/12/2019 Time:  2:26 AM  Group Topic/Focus:  Wrap-Up Group:   The focus of this group is to help patients review their daily goal of treatment and discuss progress on daily workbooks.  Participation Level:  Active  Participation Quality:  Appropriate  Affect:  Appropriate  Cognitive:  Alert  Insight: Appropriate  Engagement in Group:  Engaged  Modes of Intervention:  Discussion  Additional Comments:  Pt stated her goal was to not be as depressed.  However, pt stated she had a really bad day.  Pt stated she did attend groups but her trauma and anxiety were getting the best of her.  Pt rated the day at a 3/10.00  Cleatis Polka 07/12/2019, 2:26 AM

## 2019-07-12 NOTE — Progress Notes (Signed)
Recreation Therapy Notes  Animal-Assisted Activity (AAA) Program Checklist/Progress Notes Patient Eligibility Criteria Checklist & Daily Group note for Rec Tx Intervention  Date: 11.24.20 Time: 1430 Location: 300 Hall Dayroom  AAA/T Program Assumption of Risk Form signed by Patient/ or Parent Legal Guardian  YES   Patient is free of allergies or sever asthma  YES   Patient reports no fear of animals  YES   Patient reports no history of cruelty to animals  YES   Patient understands his/her participation is voluntary YES   Patient washes hands before animal contact  YES   Patient washes hands after animal contact  YES   Behavioral Response: Engaged  Education: Hand Washing, Appropriate Animal Interaction   Education Outcome: Acknowledges understanding/In group clarification offered/Needs additional education.   Clinical Observations/Feedback:  Pt attended and participated in activity.   Sajad Glander, LRT/CTRS         Felecia Stanfill A 07/12/2019 3:29 PM 
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
## 2019-07-12 NOTE — Progress Notes (Signed)
D:  Patient's self inventory note, patient has poor sleep, no sleep medication.  Fair appetite, low energy level, poor concentration.  Rated depression, hopeless and anxiety 10.  Denied withdrawals.  SI on form, told nurse that she was not SI, contracts for safety.  Physical problems, lightheaded, pain, dizzy, headaches, worst pain #8 in past 24 houjrs, edema, leg to feet, back, GI.  Goal is anxiety, OCD, depression.  Has chronic insomnia, racing thoughts.  No discharge plan. A:  Medications administered per MD orders.  Emotional support and encouragement given patient. R:  Safety maintained with 15 minute checks.  SI, denied with nurse, contracts for safety.  Denied HI.  Denied A/V hallucinations.
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
## 2019-07-12 NOTE — Progress Notes (Signed)
Patient has been expressing concerns related to her upcoming discharge, reporting that she is not ready for discharge. Presented to the medication room and became upset, agitated, tearful. "I have never felt like this before..I feel miserable...". Patient apologized for the behaviors and took the medications that she requested. Patient continues to express worries and would like to talk to case management.

## 2019-07-13 MED ORDER — GABAPENTIN 300 MG PO CAPS: 300.0000 mg | ORAL_CAPSULE | Freq: Three times a day (TID) | ORAL | 0 refills | Status: DC

## 2019-07-13 MED ORDER — NICOTINE 21 MG/24HR TD PT24: 21.0000 mg | MEDICATED_PATCH | Freq: Every day | TRANSDERMAL | 0 refills | Status: DC

## 2019-07-13 MED ORDER — TRAZODONE HCL 150 MG PO TABS: 150.0000 mg | ORAL_TABLET | Freq: Every day | ORAL | 0 refills | Status: DC

## 2019-07-13 MED ORDER — GABAPENTIN 300 MG PO CAPS
300.0000 mg | ORAL_CAPSULE | Freq: Three times a day (TID) | ORAL | Status: DC
  Filled 2019-07-13 (×3): qty 1

## 2019-07-13 MED ORDER — FLUTICASONE PROPIONATE 50 MCG/ACT NA SUSP: 2.0000 | Freq: Every day | NASAL | 2 refills | Status: DC

## 2019-07-13 MED ORDER — DIVALPROEX SODIUM 500 MG PO DR TAB
500.0000 mg | DELAYED_RELEASE_TABLET | Freq: Two times a day (BID) | ORAL | Status: DC
  Filled 2019-07-13 (×3): qty 1

## 2019-07-13 MED ORDER — DIVALPROEX SODIUM 500 MG PO DR TAB: 500.0000 mg | DELAYED_RELEASE_TABLET | Freq: Two times a day (BID) | ORAL | 0 refills | Status: DC

## 2019-07-13 MED ORDER — IBUPROFEN 600 MG PO TABS: 600.0000 mg | ORAL_TABLET | Freq: Four times a day (QID) | ORAL | 0 refills | Status: DC | PRN

## 2019-07-13 MED ORDER — PANTOPRAZOLE SODIUM 40 MG PO TBEC: 40.0000 mg | DELAYED_RELEASE_TABLET | Freq: Every day | ORAL | 0 refills | Status: DC

## 2019-07-13 MED ORDER — ERYTHROMYCIN 5 MG/GM OP OINT: TOPICAL_OINTMENT | Freq: Four times a day (QID) | OPHTHALMIC | 0 refills | Status: DC

## 2019-07-13 MED ORDER — OLANZAPINE 10 MG PO TABS: 10.0000 mg | ORAL_TABLET | Freq: Every day | ORAL | 0 refills | Status: DC

## 2019-07-13 MED ORDER — DIVALPROEX SODIUM 250 MG PO DR TAB: DELAYED_RELEASE_TABLET | ORAL | 0 refills | Status: DC

## 2019-07-13 MED ORDER — HYDROXYZINE HCL 50 MG PO TABS: 50.0000 mg | ORAL_TABLET | Freq: Four times a day (QID) | ORAL | 0 refills | Status: DC | PRN

## 2019-07-13 MED ORDER — MIRTAZAPINE 15 MG PO TABS: 15.0000 mg | ORAL_TABLET | Freq: Every day | ORAL | 0 refills | Status: DC
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
## 2019-07-13 NOTE — Progress Notes (Signed)
Adult Psychoeducational Group Note  Date:  07/13/2019 Time:  11:37 AM  Group Topic/Focus:  Identifying Needs:   The focus of this group is to help patients identify their personal needs that have been historically problematic and identify healthy behaviors to address their needs.  Participation Level:  Did Not Attend  Pt was informed that group with the [facility] will be starting. Pt chose not to come to group, and stay in bed instead.
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
## 2019-07-13 NOTE — Progress Notes (Signed)
  [facility] Adult Case Management Discharge Plan :  Will you be returning to the same living situation after discharge:  No. Going to stay with a friend in [place]. At discharge, do you have transportation home?: Yes,  Lyft at 1:00pm. Do you have the ability to pay for your medications: No. Referred to [facility].  Release of information consent forms completed and in the chart; letters on chart.  Patient to Follow up at: Follow-up Information    [facility] Follow up on 07/21/2019.   Why: Your hospital discharge appointment is scheduled for 12/03 at 2:30pm. The appointment will be held by phone and the provider will call you. Please have access to your current medications.  Contact information: [place] [phone] [place]. Call.   Specialty: Addiction Medicine Why: Referral information was faxed on your behalf, to follow up, please call and ask to speak with [facility], intake coordinator. Contact information: [place] [address] [phone]        Services, [facility]. Call.   Why: Referral information was faxed on your behalf, to follow up, please call and ask to speak with Ms.June, intake coordinator. Contact information: [address] [place] [address] [phone]           Next level of care provider has access to [place] and Suicide Prevention discussed: Yes,  with patient. Patient declined consents.  Have you used any form of tobacco in the last 30 days? (Cigarettes, Smokeless Tobacco, Cigars, and/or Pipes): Yes  Has patient been referred to the Quitline?: Patient refused referral  Patient has been referred for addiction treatment: Yes  Joellen Jersey, [place] 07/13/2019, 12:13 PM

## 2019-07-13 NOTE — Plan of Care (Signed)
Discharge note  Patient verbalizes readiness for discharge. Follow up plan explained, AVS, Transition record and SRA given. Prescriptions and teaching provided. Belongings returned and signed for. Suicide safety plan completed and signed. Patient verbalizes understanding. Patient denies SI/HI and assures this Probation officer they will seek assistance should that change. Patient discharged to lobby where lyft driver was waiting.  Problem: Education: Goal: Knowledge of [place] [facility] Education information/materials will improve Outcome: Adequate for Discharge Goal: Emotional status will improve Outcome: Adequate for Discharge Goal: Mental status will improve Outcome: Adequate for Discharge Goal: Verbalization of understanding the information provided will improve Outcome: Adequate for Discharge   Problem: Activity: Goal: Interest or engagement in activities will improve Outcome: Adequate for Discharge Goal: Sleeping patterns will improve Outcome: Adequate for Discharge   Problem: Coping: Goal: Ability to verbalize frustrations and anger appropriately will improve Outcome: Adequate for Discharge Goal: Ability to demonstrate self-control will improve Outcome: Adequate for Discharge   Problem: Health Behavior/Discharge Planning: Goal: Identification of resources available to assist in meeting health care needs will improve Outcome: Adequate for Discharge Goal: Compliance with treatment plan for underlying cause of condition will improve Outcome: Adequate for Discharge   Problem: Physical Regulation: Goal: Ability to maintain clinical measurements within normal limits will improve Outcome: Adequate for Discharge   Problem: Safety: Goal: Periods of time without injury will increase Outcome: Adequate for Discharge   Problem: Education: Goal: Utilization of techniques to improve thought processes will improve Outcome: Adequate for Discharge Goal: Knowledge of the prescribed therapeutic  regimen will improve Outcome: Adequate for Discharge   Problem: Activity: Goal: Interest or engagement in leisure activities will improve Outcome: Adequate for Discharge Goal: Imbalance in normal sleep/wake cycle will improve Outcome: Adequate for Discharge   Problem: Coping: Goal: Coping ability will improve Outcome: Adequate for Discharge Goal: Will verbalize feelings Outcome: Adequate for Discharge   Problem: Health Behavior/Discharge Planning: Goal: Ability to make decisions will improve Outcome: Adequate for Discharge Goal: Compliance with therapeutic regimen will improve Outcome: Adequate for Discharge   Problem: Role Relationship: Goal: Will demonstrate positive changes in social behaviors and relationships Outcome: Adequate for Discharge   Problem: Safety: Goal: Ability to disclose and discuss suicidal ideas will improve Outcome: Adequate for Discharge Goal: Ability to identify and utilize support systems that promote safety will improve Outcome: Adequate for Discharge   Problem: Self-Concept: Goal: Will verbalize positive feelings about self Outcome: Adequate for Discharge Goal: Level of anxiety will decrease Outcome: Adequate for Discharge   Problem: Education: Goal: Ability to make informed decisions regarding treatment will improve Outcome: Adequate for Discharge   Problem: Coping: Goal: Coping ability will improve Outcome: Adequate for Discharge   Problem: Health Behavior/Discharge Planning: Goal: Identification of resources available to assist in meeting health care needs will improve Outcome: Adequate for Discharge   Problem: Medication: Goal: Compliance with prescribed medication regimen will improve Outcome: Adequate for Discharge   Problem: Self-Concept: Goal: Ability to disclose and discuss suicidal ideas will improve Outcome: Adequate for Discharge Goal: Will verbalize positive feelings about self Outcome: Adequate for Discharge    Problem: Education: Goal: Knowledge of disease or condition will improve Outcome: Adequate for Discharge Goal: Understanding of discharge needs will improve Outcome: Adequate for Discharge   Problem: Health Behavior/Discharge Planning: Goal: Ability to identify changes in lifestyle to reduce recurrence of condition will improve Outcome: Adequate for Discharge Goal: Identification of resources available to assist in meeting health care needs will improve Outcome: Adequate for Discharge   Problem: Physical  Regulation: Goal: Complications related to the disease process, condition or treatment will be avoided or minimized Outcome: Adequate for Discharge   Problem: Safety: Goal: Ability to remain free from injury will improve Outcome: Adequate for Discharge
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
## 2019-07-26 ENCOUNTER — Other Ambulatory Visit: Payer: Self-pay

## 2019-07-26 ENCOUNTER — Encounter (HOSPITAL_COMMUNITY): Payer: Self-pay | Admitting: *Deleted

## 2019-07-26 ENCOUNTER — Observation Stay (HOSPITAL_COMMUNITY)
Admission: AD | Admit: 2019-07-26 | Discharge: 2019-07-27 | Disposition: A | Discharge status: Home / Self Care | Payer: No Typology Code available for payment source | Attending: Psychiatry | Admitting: Psychiatry

## 2019-07-26 DIAGNOSIS — F1420 Cocaine dependence, uncomplicated: Secondary | ICD-10-CM | POA: Diagnosis present

## 2019-07-26 DIAGNOSIS — Z20828 Contact with and (suspected) exposure to other viral communicable diseases: Secondary | ICD-10-CM | POA: Insufficient documentation

## 2019-07-26 DIAGNOSIS — F1994 Other psychoactive substance use, unspecified with psychoactive substance-induced mood disorder: Principal | ICD-10-CM

## 2019-07-26 DIAGNOSIS — F322 Major depressive disorder, single episode, severe without psychotic features: Secondary | ICD-10-CM | POA: Diagnosis present

## 2019-07-26 DIAGNOSIS — F3162 Bipolar disorder, current episode mixed, moderate: Secondary | ICD-10-CM | POA: Diagnosis present

## 2019-07-26 DIAGNOSIS — F1120 Opioid dependence, uncomplicated: Secondary | ICD-10-CM | POA: Diagnosis present

## 2019-07-26 LAB — RESPIRATORY PANEL BY RT PCR (FLU A&B, COVID)
Influenza A by PCR: NEGATIVE
Influenza B by PCR: NEGATIVE
SARS Coronavirus 2 by RT PCR: NEGATIVE

## 2019-07-26 MED ORDER — GABAPENTIN 300 MG PO CAPS
300.0000 mg | ORAL_CAPSULE | Freq: Three times a day (TID) | ORAL | Status: DC
  Administered 2019-07-26 – 2019-07-27 (×3): 300 mg via ORAL
  Filled 2019-07-26 (×2): qty 1
  Filled 2019-07-26: qty 21
  Filled 2019-07-26: qty 1

## 2019-07-26 MED ORDER — HYDROXYZINE HCL 25 MG PO TABS
25.0000 mg | ORAL_TABLET | Freq: Three times a day (TID) | ORAL | Status: DC | PRN
  Administered 2019-07-26 – 2019-07-27 (×2): 25 mg via ORAL
  Filled 2019-07-26 (×4): qty 1

## 2019-07-26 MED ORDER — DIVALPROEX SODIUM 500 MG PO DR TAB
500.0000 mg | DELAYED_RELEASE_TABLET | Freq: Two times a day (BID) | ORAL | Status: DC
  Administered 2019-07-26 – 2019-07-27 (×2): 500 mg via ORAL
  Filled 2019-07-26: qty 1
  Filled 2019-07-26: qty 14
  Filled 2019-07-26: qty 1

## 2019-07-26 MED ORDER — MIRTAZAPINE 15 MG PO TABS
15.0000 mg | ORAL_TABLET | Freq: Every day | ORAL | Status: DC
  Administered 2019-07-26: 15 mg via ORAL
  Filled 2019-07-26: qty 7
  Filled 2019-07-26: qty 1

## 2019-07-26 MED ORDER — PANTOPRAZOLE SODIUM 40 MG PO TBEC
40.0000 mg | DELAYED_RELEASE_TABLET | Freq: Every day | ORAL | Status: DC
  Administered 2019-07-26 – 2019-07-27 (×2): 40 mg via ORAL
  Filled 2019-07-26: qty 1
  Filled 2019-07-26: qty 7
  Filled 2019-07-26: qty 1

## 2019-07-26 MED ORDER — OLANZAPINE 10 MG PO TABS
10.0000 mg | ORAL_TABLET | Freq: Every day | ORAL | Status: DC
  Administered 2019-07-26: 19:00:00 10 mg via ORAL
  Filled 2019-07-26: qty 7
  Filled 2019-07-26: qty 1

## 2019-07-26 MED ORDER — NICOTINE 21 MG/24HR TD PT24
21.0000 mg | MEDICATED_PATCH | Freq: Every day | TRANSDERMAL | Status: DC
  Administered 2019-07-27: 21 mg via TRANSDERMAL
  Filled 2019-07-26: qty 1

## 2019-07-26 NOTE — Discharge Summary (Signed)
  Patient to be transferred to [facility] inpatient unit for psychiatric treatment
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
## 2019-07-27 ENCOUNTER — Encounter (HOSPITAL_COMMUNITY): Payer: Self-pay | Admitting: Registered Nurse

## 2019-07-27 ENCOUNTER — Ambulatory Visit (HOSPITAL_COMMUNITY): Payer: Self-pay

## 2019-07-27 DIAGNOSIS — F1114 Opioid abuse with opioid-induced mood disorder: Secondary | ICD-10-CM

## 2019-07-27 DIAGNOSIS — R45851 Suicidal ideations: Secondary | ICD-10-CM

## 2019-07-27 DIAGNOSIS — Z590 Homelessness: Secondary | ICD-10-CM

## 2019-07-27 DIAGNOSIS — F332 Major depressive disorder, recurrent severe without psychotic features: Secondary | ICD-10-CM

## 2019-07-27 DIAGNOSIS — F1424 Cocaine dependence with cocaine-induced mood disorder: Secondary | ICD-10-CM

## 2019-07-27 DIAGNOSIS — F1994 Other psychoactive substance use, unspecified with psychoactive substance-induced mood disorder: Principal | ICD-10-CM

## 2019-07-27 LAB — RAPID URINE DRUG SCREEN, HOSP PERFORMED
Amphetamines: NOT DETECTED
Barbiturates: NOT DETECTED
Benzodiazepines: NOT DETECTED
Cocaine: NOT DETECTED
Opiates: NOT DETECTED
Tetrahydrocannabinol: NOT DETECTED

## 2019-07-27 LAB — URINALYSIS, COMPLETE (UACMP) WITH MICROSCOPIC
Bacteria, UA: NONE SEEN
Bilirubin Urine: NEGATIVE
Glucose, UA: NEGATIVE mg/dL
Ketones, ur: 5 mg/dL — AB
Leukocytes,Ua: NEGATIVE
Nitrite: NEGATIVE
Protein, ur: 30 mg/dL — AB
RBC / HPF: >50 RBC/hpf — ABNORMAL HIGH (ref 0–5)
Specific Gravity, Urine: 1.023 (ref 1.005–1.030)
pH: 5 (ref 5.0–8.0)

## 2019-07-27 LAB — PREGNANCY, URINE: Preg Test, Ur: NEGATIVE

## 2019-07-27 MED ORDER — NALTREXONE HCL 50 MG PO TABS: 50.0000 mg | ORAL_TABLET | Freq: Every day | ORAL | 0 refills | Status: AC

## 2019-07-27 MED ORDER — GABAPENTIN 300 MG PO CAPS: 300.0000 mg | ORAL_CAPSULE | Freq: Three times a day (TID) | ORAL | 0 refills | Status: AC

## 2019-07-27 MED ORDER — LORAZEPAM 1 MG PO TABS
1.0000 mg | ORAL_TABLET | ORAL | Status: AC
  Administered 2019-07-27: 1 mg via ORAL
  Filled 2019-07-27: qty 1

## 2019-07-27 MED ORDER — DIVALPROEX SODIUM 500 MG PO DR TAB: 500.0000 mg | DELAYED_RELEASE_TABLET | Freq: Two times a day (BID) | ORAL | 0 refills | Status: AC

## 2019-07-27 MED ORDER — GABAPENTIN 300 MG PO CAPS: 300.0000 mg | ORAL_CAPSULE | Freq: Three times a day (TID) | ORAL | 0 refills | Status: DC

## 2019-07-27 MED ORDER — NALTREXONE HCL 50 MG PO TABS
50.0000 mg | ORAL_TABLET | Freq: Every day | ORAL | Status: DC
  Administered 2019-07-27: 10:00:00 50 mg via ORAL
  Filled 2019-07-27: qty 1
  Filled 2019-07-27: qty 7

## 2019-07-27 MED ORDER — NALTREXONE HCL 50 MG PO TABS: 50.0000 mg | ORAL_TABLET | Freq: Every day | ORAL | 0 refills | Status: DC

## 2019-07-27 MED ORDER — DIVALPROEX SODIUM 500 MG PO DR TAB: 500.0000 mg | DELAYED_RELEASE_TABLET | Freq: Two times a day (BID) | ORAL | 0 refills | Status: DC

## 2019-07-27 MED ORDER — HYDROXYZINE HCL 25 MG PO TABS: 25.0000 mg | ORAL_TABLET | Freq: Three times a day (TID) | ORAL | 0 refills | Status: DC | PRN

## 2019-07-27 MED ORDER — HYDROXYZINE HCL 25 MG PO TABS: 25.0000 mg | ORAL_TABLET | Freq: Three times a day (TID) | ORAL | 0 refills | Status: AC | PRN

## 2019-07-27 MED ORDER — PANTOPRAZOLE SODIUM 40 MG PO TBEC: 40.0000 mg | DELAYED_RELEASE_TABLET | Freq: Every day | ORAL | 0 refills | Status: AC

## 2019-07-27 MED ORDER — TRAZODONE HCL 100 MG PO TABS: 100.0000 mg | ORAL_TABLET | Freq: Every evening | ORAL | 0 refills | Status: DC | PRN

## 2019-07-27 MED ORDER — PANTOPRAZOLE SODIUM 40 MG PO TBEC: 40.0000 mg | DELAYED_RELEASE_TABLET | Freq: Every day | ORAL | 0 refills | Status: DC

## 2019-07-27 MED ORDER — OLANZAPINE 10 MG PO TABS: 10.0000 mg | ORAL_TABLET | Freq: Every day | ORAL | 0 refills | Status: AC

## 2019-07-27 MED ORDER — MIRTAZAPINE 15 MG PO TABS: 15.0000 mg | ORAL_TABLET | Freq: Every day | ORAL | 0 refills | Status: AC

## 2019-07-27 MED ORDER — MIRTAZAPINE 15 MG PO TABS: 15.0000 mg | ORAL_TABLET | Freq: Every day | ORAL | 0 refills | Status: DC

## 2019-07-27 MED ORDER — ACETAMINOPHEN 325 MG PO TABS: 650.0000 mg | ORAL_TABLET | Freq: Four times a day (QID) | ORAL | Status: DC | PRN

## 2019-07-27 MED ORDER — TRAZODONE HCL 100 MG PO TABS: 100.0000 mg | ORAL_TABLET | Freq: Every evening | ORAL | 0 refills | Status: AC | PRN

## 2019-07-27 MED ORDER — DIPHENHYDRAMINE HCL 25 MG PO CAPS
50.0000 mg | ORAL_CAPSULE | ORAL | Status: AC
  Administered 2019-07-27: 10:00:00 50 mg via ORAL
  Filled 2019-07-27: qty 2

## 2019-07-27 MED ORDER — TRAZODONE HCL 100 MG PO TABS
100.0000 mg | ORAL_TABLET | Freq: Every evening | ORAL | Status: DC | PRN
  Filled 2019-07-27: qty 7

## 2019-07-27 MED ORDER — OLANZAPINE 10 MG PO TABS: 10.0000 mg | ORAL_TABLET | Freq: Every day | ORAL | 0 refills | Status: DC
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
## 2019-07-27 NOTE — BHH Suicide Risk Assessment (Signed)
[facility] Discharge Suicide Risk Assessment   Principal Problem: Substance induced mood disorder ([place]) Discharge Diagnoses: Principal Problem:   Substance induced mood disorder ([place]) Active Problems:   Opioid use disorder, severe, dependence (HCC)   MDD (major depressive disorder), severe (HCC)   Bipolar 1 disorder, mixed, moderate (HCC)   Cocaine use disorder, moderate, dependence ([place])   Total Time spent with patient: 30 minutes  Musculoskeletal: Strength & Muscle Tone: within normal limits Gait & Station: normal Patient leans: N/A  Psychiatric Specialty Exam: Review of Systems  Musculoskeletal: Positive for joint pain and myalgias.  All other systems reviewed and are negative.   Blood pressure 94/66, pulse 93, temperature 98.3 F (36.8 C), temperature source Oral, resp. rate 18, last menstrual period 06/30/2019, SpO2 97 %.There is no height or weight on file to calculate BMI.  General Appearance: Disheveled  Eye Sport and exercise psychologist::  Fair  Speech:  Normal Rate409  Volume:  Normal  Mood:  Anxious  Affect:  Congruent  Thought Process:  Coherent and Descriptions of Associations: Intact  Orientation:  Full (Time, Place, and Person)  Thought Content:  Logical  Suicidal Thoughts:  No  Homicidal Thoughts:  No  Memory:  Immediate;   Fair Recent;   Fair Remote;   Fair  Judgement:  Intact  Insight:  Fair  Psychomotor Activity:  Increased  Concentration:  Good  Recall:  Good  Fund of Knowledge:Fair  Language: Good  Akathisia:  Negative  Handed:  Right  AIMS (if indicated):     Assets:  Desire for Improvement Resilience  Sleep:     Cognition: WNL  ADL's:  Intact   Mental Status Per Nursing Assessment::   On Admission:  Suicidal ideation indicated by patient, Suicide plan  Demographic Factors:  Divorced or widowed, Caucasian, Low socioeconomic status and Unemployed  Loss Factors: NA  Historical Factors: Impulsivity  Risk Reduction Factors:   NA  Continued Clinical  Symptoms:  Bipolar Disorder:   Depressive phase Alcohol/Substance Abuse/Dependencies  Cognitive Features That Contribute To Risk:  None    Suicide Risk:  Minimal: No identifiable suicidal ideation.  Patients presenting with no risk factors but with morbid ruminations; may be classified as minimal risk based on the severity of the depressive symptoms    Plan Of Care/Follow-up recommendations:  Activity:  ad lib  Sharma Covert, MD 07/27/2019, 1:54 PM
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
## 2019-07-27 NOTE — Discharge Instructions (Addendum)
For your behavioral health needs you are advised to follow up with [facility].  New patients are seen at their walk-in clinic.  Walk-in hours are Monday - Friday from 8:30 am - 12:00 pm, and from 1:00 pm - 2:30 pm.  Walk-in patients are seen on a first come, first served basis, so try to arrive as early as possible for the best chance of being seen the same day:       [facility]      [address]      [place], [place] [address]      [phone]  For your shelter needs, you are advised to contact [facility] at your earliest opportunity:       [facility]      [phone]  For other supportive services, contact the [facility]:       [facility]      [address]      [place], [place]  [address]      [phone]  
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
## 2019-07-27 NOTE — Progress Notes (Signed)
Discharge note  Patient verbalizes readiness for discharge. Follow up plan explained, AVS, Transition record and SRA given. Prescriptions and teaching provided. Belongings returned and signed for. Suicide safety plan completed and signed. Patient verbalizes understanding. Patient denies SI/HI and assures this Probation officer they will seek assistance should that change. Patient discharged to lobby where lyft was waiting.

## 2020-01-17 ENCOUNTER — Emergency Department (HOSPITAL_COMMUNITY)
Admission: EM | Admit: 2020-01-17 | Discharge: 2020-01-17 | Disposition: A | Discharge status: Home / Self Care | Payer: Self-pay | Attending: Emergency Medicine | Admitting: Emergency Medicine

## 2020-01-17 ENCOUNTER — Encounter (HOSPITAL_COMMUNITY): Payer: Self-pay

## 2020-01-17 DIAGNOSIS — F909 Attention-deficit hyperactivity disorder, unspecified type: Secondary | ICD-10-CM | POA: Insufficient documentation

## 2020-01-17 DIAGNOSIS — F17210 Nicotine dependence, cigarettes, uncomplicated: Secondary | ICD-10-CM | POA: Insufficient documentation

## 2020-01-17 DIAGNOSIS — F1910 Other psychoactive substance abuse, uncomplicated: Secondary | ICD-10-CM | POA: Insufficient documentation

## 2020-01-17 DIAGNOSIS — L0291 Cutaneous abscess, unspecified: Secondary | ICD-10-CM

## 2020-01-17 DIAGNOSIS — L02414 Cutaneous abscess of left upper limb: Secondary | ICD-10-CM | POA: Insufficient documentation

## 2020-01-17 DIAGNOSIS — L03114 Cellulitis of left upper limb: Secondary | ICD-10-CM | POA: Insufficient documentation

## 2020-01-17 LAB — CBC WITH DIFFERENTIAL/PLATELET
Abs Immature Granulocytes: 0.02 10*3/uL (ref 0.00–0.07)
Basophils Absolute: 0.1 10*3/uL (ref 0.0–0.1)
Basophils Relative: 1 %
Eosinophils Absolute: 0.2 10*3/uL (ref 0.0–0.5)
Eosinophils Relative: 2 %
HCT: 39.1 % (ref 36.0–46.0)
Hemoglobin: 12.6 g/dL (ref 12.0–15.0)
Immature Granulocytes: 0 %
Lymphocytes Relative: 32 %
Lymphs Abs: 3 10*3/uL (ref 0.7–4.0)
MCH: 28.6 pg (ref 26.0–34.0)
MCHC: 32.2 g/dL (ref 30.0–36.0)
MCV: 88.7 fL (ref 80.0–100.0)
Monocytes Absolute: 0.8 10*3/uL (ref 0.1–1.0)
Monocytes Relative: 8 %
Neutro Abs: 5.4 10*3/uL (ref 1.7–7.7)
Neutrophils Relative %: 57 %
Platelets: 223 10*3/uL (ref 150–400)
RBC: 4.41 MIL/uL (ref 3.87–5.11)
RDW: 13 % (ref 11.5–15.5)
WBC: 9.4 10*3/uL (ref 4.0–10.5)
nRBC: 0 % (ref 0.0–0.2)

## 2020-01-17 LAB — BASIC METABOLIC PANEL
Anion gap: 12 (ref 5–15)
BUN: 15 mg/dL (ref 6–20)
CO2: 28 mmol/L (ref 22–32)
Calcium: 9.3 mg/dL (ref 8.9–10.3)
Chloride: 96 mmol/L — ABNORMAL LOW (ref 98–111)
Creatinine, Ser: 0.67 mg/dL (ref 0.44–1.00)
GFR calc Af Amer: >60 mL/min (ref >60)
GFR calc non Af Amer: >60 mL/min (ref >60)
Glucose, Bld: 105 mg/dL — ABNORMAL HIGH (ref 70–99)
Potassium: 4 mmol/L (ref 3.5–5.1)
Sodium: 136 mmol/L (ref 135–145)

## 2020-01-17 MED ORDER — VANCOMYCIN HCL IN DEXTROSE 1-5 GM/200ML-% IV SOLN
1000.0000 mg | Freq: Once | INTRAVENOUS | Status: AC
  Administered 2020-01-17: 1000 mg via INTRAVENOUS
  Filled 2020-01-17: qty 200

## 2020-01-17 MED ORDER — DOXYCYCLINE HYCLATE 100 MG PO CAPS: 100.0000 mg | ORAL_CAPSULE | Freq: Two times a day (BID) | ORAL | 0 refills | Status: AC

## 2020-01-17 MED ORDER — LIDOCAINE-EPINEPHRINE (PF) 2 %-1:200000 IJ SOLN
10.0000 mL | Freq: Once | INTRAMUSCULAR | Status: AC
  Administered 2020-01-17: 10 mL
  Filled 2020-01-17: qty 20

## 2020-01-17 NOTE — ED Triage Notes (Signed)
Pt reports an abscess on her L wrist. L hand is swollen. States that abscess appeared 3 days ago. Purulent discharge noted.

## 2020-01-17 NOTE — Discharge Instructions (Addendum)
You have been offered admission for your abscess and infection but have chosen to go home. Please return to the ED if you change your mind.   It is important to take the antibiotic until gone.

## 2020-01-17 NOTE — ED Provider Notes (Signed)
[facility]-EMERGENCY DEPT Provider Note   CSN: [id number] Arrival date & time: 01/17/20  0442     History Chief Complaint  Patient presents with  . Abscess    Amanda Carrillo is a 44 y.o. female.  Patient to ED with painful, red sore to left dorsal wrist x 1 week with associated swelling and redness of her entire hand. She reports it started draining purulent fluid today. She denies fever. She admits to IV drug use and the swelling started at an injection site. History of same.   The history is provided by the patient. No language interpreter was used.  Abscess Associated symptoms: no fever and no nausea        Past Medical History:  Diagnosis Date  . ADHD (attention deficit hyperactivity disorder)   . Anxiety   . Depression   . Gastroparesis 01/2017  . Polysubstance abuse (HCC)    herion, methadone, etoh    Patient Active Problem List   Diagnosis Date Noted  . Substance induced mood disorder (HCC) 07/27/2019  . Bipolar 1 disorder (HCC) 07/09/2019  . Cocaine use disorder, moderate, dependence (HCC) 11/19/2017  . Bipolar 1 disorder, mixed, moderate (HCC) 11/05/2017  . MDD (major depressive disorder), severe (HCC) 11/04/2017  . Opioid use disorder, severe, dependence (HCC) 08/07/2017  . Opioid use disorder (HCC) 08/04/2017  . Depression     Past Surgical History:  Procedure Laterality Date  . CHOLECYSTECTOMY    . TUBAL LIGATION    . TUBAL LIGATION       OB History    Gravida  3   Para  2   Term  2   Preterm      AB  1   Living        SAB      TAB  1   Ectopic      Multiple      Live Births              Family History  Problem Relation Age of Onset  . Pneumonia Other   . Depression Mother     Social History   Tobacco Use  . Smoking status: Current Every Day Smoker    Packs/day: 1.00    Years: 15.00    Pack years: 15.00    Types: Cigarettes  . Smokeless tobacco: Never Used  Substance Use Topics  . Alcohol  use: Not Currently    Alcohol/week: 8.0 standard drinks    Types: 8 Cans of beer per week    Comment: over 6 months   . Drug use: Not Currently    Types: Methamphetamines, IV    Comment: used heroin and crystal meth 24 hours ago     Home Medications Prior to Admission medications   Medication Sig Start Date End Date Taking? Authorizing Provider  divalproex (DEPAKOTE) 500 MG DR tablet Take 1 tablet (500 mg total) by mouth every 12 (twelve) hours. For mood stabilization Patient not taking: Reported on 01/17/2020 07/27/19   Armandina Stammer I, NP  gabapentin (NEURONTIN) 300 MG capsule Take 1 capsule (300 mg total) by mouth 3 (three) times daily. For agitation Patient not taking: Reported on 01/17/2020 07/27/19   Armandina Stammer I, NP  hydrOXYzine (ATARAX/VISTARIL) 25 MG tablet Take 1 tablet (25 mg total) by mouth every 8 (eight) hours as needed for anxiety. Patient not taking: Reported on 01/17/2020 07/27/19   Armandina Stammer I, NP  mirtazapine (REMERON) 15 MG tablet Take 1 tablet (15  mg total) by mouth at bedtime. Patient not taking: Reported on 01/17/2020 07/27/19   Armandina Stammer I, NP  naltrexone (DEPADE) 50 MG tablet Take 1 tablet (50 mg total) by mouth daily. For opioid addiction Patient not taking: Reported on 01/17/2020 07/28/19   Armandina Stammer I, NP  OLANZapine (ZYPREXA) 10 MG tablet Take 1 tablet (10 mg total) by mouth at bedtime. For mood control Patient not taking: Reported on 01/17/2020 07/27/19   Armandina Stammer I, NP  pantoprazole (PROTONIX) 40 MG tablet Take 1 tablet (40 mg total) by mouth daily. For acid reflux Patient not taking: Reported on 01/17/2020 07/28/19   Armandina Stammer I, NP  traZODone (DESYREL) 100 MG tablet Take 1 tablet (100 mg total) by mouth at bedtime as needed for sleep. Patient not taking: Reported on 01/17/2020 07/27/19   Sanjuana Kava, NP    Allergies    Toradol [ketorolac tromethamine]  Review of Systems   Review of Systems  Constitutional: Negative for fever.  Gastrointestinal:  Negative for nausea.  Musculoskeletal:       See HPI.  Skin: Positive for color change and wound.  Neurological: Negative for weakness.    Physical Exam Updated Vital Signs BP 139/82 (BP Location: Right Arm)   Pulse (!) 107   Temp 98.9 F (37.2 C) (Oral)   Resp 14   Ht 5\' 9"  (1.753 m)   Wt 59 kg   SpO2 98%   BMI 19.20 kg/m   Physical Exam Constitutional:      Appearance: She is well-developed.  Pulmonary:     Effort: Pulmonary effort is normal.  Musculoskeletal:     Cervical back: Normal range of motion.     Comments: Left hand uniformly swollen and erythematous. There is a large abscess at dorsal radial wrist that has minimal purulent drainage. Moderate induration.  No tenderness or redness of forearm.   Skin:    General: Skin is warm and dry.  Neurological:     Mental Status: She is alert and oriented to person, place, and time.     ED Results / Procedures / Treatments   Labs (all labs ordered are listed, but only abnormal results are displayed) Labs Reviewed  CULTURE, BLOOD (ROUTINE X 2)  CULTURE, BLOOD (ROUTINE X 2)  AEROBIC CULTURE (SUPERFICIAL SPECIMEN)  CBC WITH DIFFERENTIAL/PLATELET  BASIC METABOLIC PANEL    EKG None  Radiology No results found.  Procedures . Incision and Drainage  Date/Time: 01/20/2020 6:20 AM Performed by: 03/21/2020, PA-C Authorized by: Elpidio Anis, PA-C   Consent:    Consent obtained:  Verbal   Consent given by:  Patient Location:    Type:  Abscess   Location:  Upper extremity   Upper extremity location:  Wrist   Wrist location:  L wrist Pre-procedure details:    Skin preparation:  Betadine Anesthesia (see MAR for exact dosages):    Anesthesia method:  Local infiltration   Local anesthetic:  Lidocaine 2% WITH epi Procedure type:    Complexity:  Simple Procedure details:    Needle aspiration: no     Incision types:  Single straight   Scalpel blade:  11   Drainage:  Purulent and bloody   Drainage amount:   Scant   Wound treatment:  Wound left open Post-procedure details:    Patient tolerance of procedure:  Tolerated well, no immediate complications   (including critical care time)  Medications Ordered in ED Medications  vancomycin (VANCOCIN) IVPB 1000 mg/200 mL premix (has no administration  in time range)    ED Course  I have reviewed the triage vital signs and the nursing notes.  Pertinent labs & imaging results that were available during my care of the patient were reviewed by me and considered in my medical decision making (see chart for details).    MDM Rules/Calculators/A&P                      Patient to ED with abscess/infection at IVDA injection site x 1 week. No fever.   IV abx started, labs pending. Discussed admission for continued IV abx and patient states she will refuse to stay. She needs to care for her granddaughter today.   Labs reassuring. She has received IV abx. Asked again whether she will consider admission for continued treatment, discussed risk of undertreated hand infections and complications, and patient is still adamant that she will not accept admission.   Rx Doxycycline provided. She is told she is welcome to come back to the ED anytime she wants further treatment.  Final Clinical Impression(s) / ED Diagnoses Final diagnoses:  None   1. Abscess, left hand 2. Cellulitis left hand 3. IVDA  Rx / DC Orders ED Discharge Orders    None       Charlann Lange, Hershal Coria 01/20/20 9937    Merryl Hacker, MD 01/21/20 2300

## 2020-01-17 NOTE — ED Notes (Signed)
Lidocaine epi at bedside. 

## 2020-01-19 LAB — AEROBIC CULTURE W GRAM STAIN (SUPERFICIAL SPECIMEN)

## 2020-01-20 ENCOUNTER — Telehealth: Payer: Self-pay | Admitting: Emergency Medicine

## 2020-01-20 NOTE — Progress Notes (Signed)
ED Antimicrobial Stewardship Positive Culture Follow Up   Amanda Carrillo is an 44 y.o. female who presented to [facility] on 01/17/2020 with a chief complaint of  Chief Complaint  Patient presents with  . Abscess    Recent Results (from the past 720 hour(s))  Culture, blood (routine x 2)     Status: None (Preliminary result)   Collection Time: 01/17/20  5:46 AM   Specimen: BLOOD RIGHT HAND  Result Value Ref Range Status   Specimen Description   Final    BLOOD RIGHT HAND Performed at [facility], [place] [address], [place], [place] [address]    Special Requests   Final    BOTTLES DRAWN AEROBIC AND ANAEROBIC Blood Culture results may not be optimal due to an excessive volume of blood received in culture bottles Performed at [facility] [address], [place], [place] [address]    Culture   Final    NO GROWTH 2 DAYS Performed at [facility] [address] [place], [place] [address]    Report Status PENDING  Incomplete  Culture, blood (routine x 2)     Status: None (Preliminary result)   Collection Time: 01/17/20  5:46 AM   Specimen: BLOOD  Result Value Ref Range Status   Specimen Description   Final    BLOOD LEFT ANTECUBITAL Performed at [facility] [address] [place], [place] [address]    Special Requests   Final    BOTTLES DRAWN AEROBIC AND ANAEROBIC Blood Culture results may not be optimal due to an excessive volume of blood received in culture bottles Performed at [facility] [address] [place], [place] [address]    Culture   Final    NO GROWTH 2 DAYS Performed at [facility] [address] [place], [place] [address]    Report Status PENDING  Incomplete  Wound or Superficial Culture     Status: None   Collection Time: 01/17/20  5:46 AM   Specimen: Abscess; Wound  Result Value Ref Range Status   Specimen Description   Final    ABSCESS LEFT HAND Performed at  [facility] [address], [place], [place] [address]    Special Requests   Final    NONE Performed at [facility], [place] [address], [place], [place] [address]    Gram Stain   Final    RARE WBC PRESENT, PREDOMINANTLY PMN NO ORGANISMS SEEN Performed at [facility], [place] [address] [place], [place] [address]    Culture FEW METHICILLIN RESISTANT STAPHYLOCOCCUS AUREUS  Final   Report Status 01/19/2020 FINAL  Final   Organism ID, Bacteria METHICILLIN RESISTANT STAPHYLOCOCCUS AUREUS  Final      Susceptibility   Methicillin resistant staphylococcus aureus - MIC*    CIPROFLOXACIN >=8 RESISTANT Resistant     ERYTHROMYCIN >=8 RESISTANT Resistant     GENTAMICIN <=0.5 SENSITIVE Sensitive     OXACILLIN >=4 RESISTANT Resistant     TETRACYCLINE >=16 RESISTANT Resistant     VANCOMYCIN 1 SENSITIVE Sensitive     TRIMETH/SULFA >=320 RESISTANT Resistant     CLINDAMYCIN <=0.25 SENSITIVE Sensitive     RIFAMPIN <=0.5 SENSITIVE Sensitive     Inducible Clindamycin NEGATIVE Sensitive     * FEW METHICILLIN RESISTANT STAPHYLOCOCCUS AUREUS    [x]  Treated with doxcycyline, organism resistant to prescribed antimicrobial []  Patient discharged originally without antimicrobial agent and treatment is  now indicated  New antibiotic prescription:  - Advise pt to return to ED for re-evaluation/possible admission - If patient refuses, clindamycin 300 mg PO q8h x 10 days   ED Provider: Lyndel Safe, PA-C     Adalberto Cole, PharmD, BCPS 01/20/2020 11:55 AM

## 2020-01-20 NOTE — Telephone Encounter (Signed)
Post ED Visit - Positive Culture Follow-up: Unsuccessful Patient Follow-up  Culture assessed and recommendations reviewed by:  []  , Pharm.D. []  Enzo Bi, Pharm.D., BCPS AQ-ID []  , Pharm.D., BCPS []  Celedonio Miyamoto, Pharm.D., BCPS []  [place], Garvin Fila.D., BCPS, AAHIVP []  , Pharm.D., BCPS, AAHIVP [x]  Georgina Pillion, PharmD []  , PharmD, BCPS  Positive aerobic culture  []  Patient discharged without antimicrobial prescription and treatment is now indicated [x]  Organism is resistant to prescribed ED discharge antimicrobial []  Patient with positive blood cultures   Unable to contact patient @ phone number on file, message left, letter will be sent to address on file  Plan: Advise to return to ED for re-eval/admission (possibly) If refuses: Clindamycin 300 mg PO Q 8 hours x ten days, [place], [address]  C Mcihael Hinderman 01/20/2020, 1:31 PM

## 2020-01-22 LAB — CULTURE, BLOOD (ROUTINE X 2)
Culture: NO GROWTH
Culture: NO GROWTH

## 2020-03-16 ENCOUNTER — Other Ambulatory Visit: Payer: Self-pay

## 2020-03-16 ENCOUNTER — Emergency Department (HOSPITAL_COMMUNITY)
Admission: EM | Admit: 2020-03-16 | Discharge: 2020-03-16 | Disposition: A | Discharge status: Home / Self Care | Payer: Self-pay | Attending: Emergency Medicine | Admitting: Emergency Medicine

## 2020-03-16 ENCOUNTER — Encounter (HOSPITAL_COMMUNITY): Payer: Self-pay | Admitting: *Deleted

## 2020-03-16 DIAGNOSIS — Z79899 Other long term (current) drug therapy: Secondary | ICD-10-CM | POA: Insufficient documentation

## 2020-03-16 DIAGNOSIS — F1193 Opioid use, unspecified with withdrawal: Secondary | ICD-10-CM

## 2020-03-16 DIAGNOSIS — F909 Attention-deficit hyperactivity disorder, unspecified type: Secondary | ICD-10-CM | POA: Insufficient documentation

## 2020-03-16 DIAGNOSIS — F17210 Nicotine dependence, cigarettes, uncomplicated: Secondary | ICD-10-CM | POA: Insufficient documentation

## 2020-03-16 DIAGNOSIS — F1120 Opioid dependence, uncomplicated: Secondary | ICD-10-CM | POA: Insufficient documentation

## 2020-03-16 DIAGNOSIS — R112 Nausea with vomiting, unspecified: Secondary | ICD-10-CM | POA: Insufficient documentation

## 2020-03-16 DIAGNOSIS — Z8639 Personal history of other endocrine, nutritional and metabolic disease: Secondary | ICD-10-CM | POA: Insufficient documentation

## 2020-03-16 LAB — COMPREHENSIVE METABOLIC PANEL
ALT: 24 U/L (ref 0–44)
AST: 21 U/L (ref 15–41)
Albumin: 3.2 g/dL — ABNORMAL LOW (ref 3.5–5.0)
Alkaline Phosphatase: 54 U/L (ref 38–126)
Anion gap: 7 (ref 5–15)
BUN: 10 mg/dL (ref 6–20)
CO2: 27 mmol/L (ref 22–32)
Calcium: 9.1 mg/dL (ref 8.9–10.3)
Chloride: 103 mmol/L (ref 98–111)
Creatinine, Ser: 0.78 mg/dL (ref 0.44–1.00)
GFR calc Af Amer: >60 mL/min (ref >60)
GFR calc non Af Amer: >60 mL/min (ref >60)
Glucose, Bld: 105 mg/dL — ABNORMAL HIGH (ref 70–99)
Potassium: 3.6 mmol/L (ref 3.5–5.1)
Sodium: 137 mmol/L (ref 135–145)
Total Bilirubin: 0.8 mg/dL (ref 0.3–1.2)
Total Protein: 6.4 g/dL — ABNORMAL LOW (ref 6.5–8.1)

## 2020-03-16 LAB — CBC WITH DIFFERENTIAL/PLATELET
Abs Immature Granulocytes: 0.03 10*3/uL (ref 0.00–0.07)
Basophils Absolute: 0.1 10*3/uL (ref 0.0–0.1)
Basophils Relative: 1 %
Eosinophils Absolute: 0.1 10*3/uL (ref 0.0–0.5)
Eosinophils Relative: 1 %
HCT: 42.4 % (ref 36.0–46.0)
Hemoglobin: 13.7 g/dL (ref 12.0–15.0)
Immature Granulocytes: 0 %
Lymphocytes Relative: 42 %
Lymphs Abs: 3.7 10*3/uL (ref 0.7–4.0)
MCH: 28.2 pg (ref 26.0–34.0)
MCHC: 32.3 g/dL (ref 30.0–36.0)
MCV: 87.4 fL (ref 80.0–100.0)
Monocytes Absolute: 0.8 10*3/uL (ref 0.1–1.0)
Monocytes Relative: 9 %
Neutro Abs: 4.2 10*3/uL (ref 1.7–7.7)
Neutrophils Relative %: 47 %
Platelets: 168 10*3/uL (ref 150–400)
RBC: 4.85 MIL/uL (ref 3.87–5.11)
RDW: 13.2 % (ref 11.5–15.5)
WBC: 8.9 10*3/uL (ref 4.0–10.5)
nRBC: 0 % (ref 0.0–0.2)

## 2020-03-16 LAB — I-STAT BETA HCG BLOOD, ED (MC, WL, AP ONLY): I-stat hCG, quantitative: <5 m[IU]/mL (ref <5)

## 2020-03-16 LAB — LIPASE, BLOOD: Lipase: 38 U/L (ref 11–51)

## 2020-03-16 MED ORDER — ONDANSETRON HCL 4 MG/2ML IJ SOLN
4.0000 mg | Freq: Once | INTRAMUSCULAR | Status: AC
  Administered 2020-03-16: 4 mg via INTRAVENOUS
  Filled 2020-03-16: qty 2

## 2020-03-16 MED ORDER — ONDANSETRON 8 MG PO TBDP: ORAL_TABLET | ORAL | 0 refills | Status: AC

## 2020-03-16 MED ORDER — METOCLOPRAMIDE HCL 5 MG/ML IJ SOLN
10.0000 mg | Freq: Once | INTRAMUSCULAR | Status: AC
  Administered 2020-03-16: 10 mg via INTRAVENOUS
  Filled 2020-03-16: qty 2

## 2020-03-16 MED ORDER — SODIUM CHLORIDE 0.9 % IV BOLUS
1000.0000 mL | Freq: Once | INTRAVENOUS | Status: AC
  Administered 2020-03-16: 1000 mL via INTRAVENOUS

## 2020-03-16 NOTE — ED Triage Notes (Signed)
[facility]  For ??? Seizures ems  Reports that it appeared to be a pseudo seizure because of her actions of talking  Immediately after the seizure  She was reported to attempt to hang herself this am in the jail  She has a hand cuff around her ltg hand and shackles around her ankle  Alert no distress  She reports that  She has been on every drug there is for 2 years staright she is c/o back pain and abd pain trying to vom it at present

## 2020-03-16 NOTE — ED Provider Notes (Signed)
[facility] EMERGENCY DEPARTMENT Provider Note   CSN: [id number] Arrival date & time: 03/16/20  1935     History Chief Complaint  Patient presents with  . Seizures    Calisha L Desaulniers is a 44 y.o. female.  Patient is a 44 year old female with past medical history of polysubstance abuse, bipolar disorder, gastroparesis.  Patient incarcerated 5 days ago and has not used any IV drugs since.  She believes she is going through withdrawal and cannot stop vomiting.  She was brought this evening after an episode of shaking and concerns of a seizure.  There was no bowel or bladder incontinence and no oral trauma.  Patient has no history of seizures.  The history is provided by the patient.       Past Medical History:  Diagnosis Date  . ADHD (attention deficit hyperactivity disorder)   . Anxiety   . Depression   . Gastroparesis 01/2017  . Polysubstance abuse (HCC)    herion, methadone, etoh    Patient Active Problem List   Diagnosis Date Noted  . Substance induced mood disorder (HCC) 07/27/2019  . Bipolar 1 disorder (HCC) 07/09/2019  . Cocaine use disorder, moderate, dependence (HCC) 11/19/2017  . Bipolar 1 disorder, mixed, moderate (HCC) 11/05/2017  . MDD (major depressive disorder), severe (HCC) 11/04/2017  . Opioid use disorder, severe, dependence (HCC) 08/07/2017  . Opioid use disorder (HCC) 08/04/2017  . Depression     Past Surgical History:  Procedure Laterality Date  . CHOLECYSTECTOMY    . TUBAL LIGATION    . TUBAL LIGATION       OB History    Gravida  3   Para  2   Term  2   Preterm      AB  1   Living        SAB      TAB  1   Ectopic      Multiple      Live Births              Family History  Problem Relation Age of Onset  . Pneumonia Other   . Depression Mother     Social History   Tobacco Use  . Smoking status: Current Every Day Smoker    Packs/day: 1.00    Years: 15.00    Pack years: 15.00    Types:  Cigarettes  . Smokeless tobacco: Never Used  Vaping Use  . Vaping Use: Never used  Substance Use Topics  . Alcohol use: Not Currently    Alcohol/week: 8.0 standard drinks    Types: 8 Cans of beer per week    Comment: over 6 months   . Drug use: Not Currently    Types: Methamphetamines, IV    Comment: used heroin and crystal meth 24 hours ago     Home Medications Prior to Admission medications   Medication Sig Start Date End Date Taking? Authorizing Provider  divalproex (DEPAKOTE) 500 MG DR tablet Take 1 tablet (500 mg total) by mouth every 12 (twelve) hours. For mood stabilization Patient not taking: Reported on 01/17/2020 07/27/19   Armandina Stammer I, NP  doxycycline (VIBRAMYCIN) 100 MG capsule Take 1 capsule (100 mg total) by mouth 2 (two) times daily. 01/17/20   Elpidio Anis, PA-C  gabapentin (NEURONTIN) 300 MG capsule Take 1 capsule (300 mg total) by mouth 3 (three) times daily. For agitation Patient not taking: Reported on 01/17/2020 07/27/19   Armandina Stammer I, NP  hydrOXYzine (  ATARAX/VISTARIL) 25 MG tablet Take 1 tablet (25 mg total) by mouth every 8 (eight) hours as needed for anxiety. Patient not taking: Reported on 01/17/2020 07/27/19   Armandina Stammer I, NP  mirtazapine (REMERON) 15 MG tablet Take 1 tablet (15 mg total) by mouth at bedtime. Patient not taking: Reported on 01/17/2020 07/27/19   Armandina Stammer I, NP  naltrexone (DEPADE) 50 MG tablet Take 1 tablet (50 mg total) by mouth daily. For opioid addiction Patient not taking: Reported on 01/17/2020 07/28/19   Armandina Stammer I, NP  OLANZapine (ZYPREXA) 10 MG tablet Take 1 tablet (10 mg total) by mouth at bedtime. For mood control Patient not taking: Reported on 01/17/2020 07/27/19   Armandina Stammer I, NP  pantoprazole (PROTONIX) 40 MG tablet Take 1 tablet (40 mg total) by mouth daily. For acid reflux Patient not taking: Reported on 01/17/2020 07/28/19   Armandina Stammer I, NP  traZODone (DESYREL) 100 MG tablet Take 1 tablet (100 mg total) by mouth at  bedtime as needed for sleep. Patient not taking: Reported on 01/17/2020 07/27/19   Sanjuana Kava, NP    Allergies    Toradol [ketorolac tromethamine]  Review of Systems   Review of Systems  Neurological: Positive for seizures.  All other systems reviewed and are negative.   Physical Exam Updated Vital Signs BP 108/70   Pulse 78   Temp 98.3 F (36.8 C) (Oral)   Resp 18   Ht 5\' 9"  (1.753 m)   Wt 59 kg   SpO2 98%   BMI 19.21 kg/m   Physical Exam Vitals and nursing note reviewed.  Constitutional:      General: She is not in acute distress.    Appearance: She is well-developed. She is not diaphoretic.  HENT:     Head: Normocephalic and atraumatic.  Eyes:     Extraocular Movements: Extraocular movements intact.     Pupils: Pupils are equal, round, and reactive to light.  Cardiovascular:     Rate and Rhythm: Normal rate and regular rhythm.     Heart sounds: No murmur heard.  No friction rub. No gallop.   Pulmonary:     Effort: Pulmonary effort is normal. No respiratory distress.     Breath sounds: Normal breath sounds. No wheezing.  Abdominal:     General: Bowel sounds are normal. There is no distension.     Palpations: Abdomen is soft.     Tenderness: There is abdominal tenderness. There is no guarding or rebound.     Comments: There is tenderness to palpation across the upper abdomen, most notably in the epigastric region.  Musculoskeletal:        General: Normal range of motion.     Cervical back: Normal range of motion and neck supple.  Skin:    General: Skin is warm and dry.  Neurological:     General: No focal deficit present.     Mental Status: She is alert and oriented to person, place, and time.     Cranial Nerves: No cranial nerve deficit.     ED Results / Procedures / Treatments   Labs (all labs ordered are listed, but only abnormal results are displayed) Labs Reviewed  COMPREHENSIVE METABOLIC PANEL  CBC WITH DIFFERENTIAL/PLATELET  LIPASE, BLOOD    I-STAT BETA HCG BLOOD, ED ([facility], [facility], [facility] ONLY)    EKG None  Radiology No results found.  Procedures Procedures (including critical care time)  Medications Ordered in ED Medications  sodium chloride 0.9 %  bolus 1,000 mL (has no administration in time range)  metoCLOPramide (REGLAN) injection 10 mg (has no administration in time range)  ondansetron (ZOFRAN) injection 4 mg (has no administration in time range)    ED Course  I have reviewed the triage vital signs and the nursing notes.  Pertinent labs & imaging results that were available during my care of the patient were reviewed by me and considered in my medical decision making (see chart for details).    MDM Rules/Calculators/A&P  Patient presenting here with complaints of vomiting and possible shaking like activity.  Patient incarcerated 5 days ago and was brought here from the jail.  She has a history of IV drug abuse and I suspect there is a component of withdrawal.  She tells me that she has gastroparesis as well and this also may play a role in her vomiting.  On exam, vitals are stable and she is afebrile.  She has mild tenderness across the upper abdomen, however no peritoneal signs.  Her laboratory studies are all unremarkable including CBC comprehensive metabolic panel, and lipase.  Patient given IV fluids along with Reglan.  She is now asleep and resting comfortably.  Patient to be discharged with Zofran.  There was the mention in the nurses note about trying to hang herself, however she is expressed no suicidality during her emergency department visit.  Final Clinical Impression(s) / ED Diagnoses Final diagnoses:  None    Rx / DC Orders ED Discharge Orders    None       Geoffery Lyons, MD 03/16/20 2237

## 2020-03-16 NOTE — Discharge Instructions (Signed)
Take Zofran as prescribed as needed for nausea.  Return to the ER for severe abdominal pain, bloody stool or vomit, high fever, or other new and concerning symptoms.

## 2020-06-04 ENCOUNTER — Emergency Department (HOSPITAL_COMMUNITY)
Admission: EM | Admit: 2020-06-04 | Discharge: 2020-06-05 | Disposition: A | Discharge status: Home / Self Care | Payer: Self-pay | Attending: Emergency Medicine | Admitting: Emergency Medicine

## 2020-06-04 ENCOUNTER — Other Ambulatory Visit: Payer: Self-pay

## 2020-06-04 ENCOUNTER — Encounter (HOSPITAL_COMMUNITY): Payer: Self-pay | Admitting: Emergency Medicine

## 2020-06-04 DIAGNOSIS — F17210 Nicotine dependence, cigarettes, uncomplicated: Secondary | ICD-10-CM | POA: Insufficient documentation

## 2020-06-04 DIAGNOSIS — R600 Localized edema: Secondary | ICD-10-CM | POA: Insufficient documentation

## 2020-06-04 LAB — COMPREHENSIVE METABOLIC PANEL
ALT: 15 U/L (ref 0–44)
AST: 23 U/L (ref 15–41)
Albumin: 3.2 g/dL — ABNORMAL LOW (ref 3.5–5.0)
Alkaline Phosphatase: 60 U/L (ref 38–126)
Anion gap: 10 (ref 5–15)
BUN: 5 mg/dL — ABNORMAL LOW (ref 6–20)
CO2: 28 mmol/L (ref 22–32)
Calcium: 9.2 mg/dL (ref 8.9–10.3)
Chloride: 100 mmol/L (ref 98–111)
Creatinine, Ser: 1.09 mg/dL — ABNORMAL HIGH (ref 0.44–1.00)
GFR, Estimated: >60 mL/min (ref >60)
Glucose, Bld: 84 mg/dL (ref 70–99)
Potassium: 3.1 mmol/L — ABNORMAL LOW (ref 3.5–5.1)
Sodium: 138 mmol/L (ref 135–145)
Total Bilirubin: 0.9 mg/dL (ref 0.3–1.2)
Total Protein: 7.4 g/dL (ref 6.5–8.1)

## 2020-06-04 LAB — CBC WITH DIFFERENTIAL/PLATELET
Abs Immature Granulocytes: 0.02 10*3/uL (ref 0.00–0.07)
Basophils Absolute: 0 10*3/uL (ref 0.0–0.1)
Basophils Relative: 0 %
Eosinophils Absolute: 0 10*3/uL (ref 0.0–0.5)
Eosinophils Relative: 1 %
HCT: 32.4 % — ABNORMAL LOW (ref 36.0–46.0)
Hemoglobin: 9.8 g/dL — ABNORMAL LOW (ref 12.0–15.0)
Immature Granulocytes: 0 %
Lymphocytes Relative: 17 %
Lymphs Abs: 1 10*3/uL (ref 0.7–4.0)
MCH: 29.3 pg (ref 26.0–34.0)
MCHC: 30.2 g/dL (ref 30.0–36.0)
MCV: 97 fL (ref 80.0–100.0)
Monocytes Absolute: 0.5 10*3/uL (ref 0.1–1.0)
Monocytes Relative: 9 %
Neutro Abs: 4.1 10*3/uL (ref 1.7–7.7)
Neutrophils Relative %: 73 %
Platelets: 176 10*3/uL (ref 150–400)
RBC: 3.34 MIL/uL — ABNORMAL LOW (ref 3.87–5.11)
RDW: 15 % (ref 11.5–15.5)
WBC: 5.6 10*3/uL (ref 4.0–10.5)
nRBC: 0 % (ref 0.0–0.2)

## 2020-06-04 LAB — I-STAT BETA HCG BLOOD, ED (MC, WL, AP ONLY): I-stat hCG, quantitative: <5 m[IU]/mL (ref <5)

## 2020-06-04 NOTE — ED Triage Notes (Signed)
Patient reports bilateral lower legs and feet edema/swelling onset last week , denies fall or injury/ambulatory , respirations unlabored , no fever or chills .

## 2020-06-05 ENCOUNTER — Emergency Department (HOSPITAL_COMMUNITY): Payer: Self-pay

## 2020-06-05 MED ORDER — POTASSIUM CHLORIDE CRYS ER 20 MEQ PO TBCR: 40.0000 meq | EXTENDED_RELEASE_TABLET | Freq: Once | ORAL | Status: DC

## 2020-06-05 MED ORDER — SODIUM CHLORIDE 0.9 % IV BOLUS: 500.0000 mL | Freq: Once | INTRAVENOUS | Status: DC

## 2020-06-05 MED ORDER — FENTANYL CITRATE (PF) 100 MCG/2ML IJ SOLN
50.0000 ug | Freq: Once | INTRAMUSCULAR | Status: DC
  Filled 2020-06-05: qty 2

## 2020-06-05 NOTE — ED Provider Notes (Signed)
[facility] EMERGENCY DEPARTMENT Provider Note   CSN: [id number] Arrival date & time: 06/04/20  2041     History Chief Complaint  Patient presents with  . Legs Swelling/Edema    Amanda Carrillo is a 44 y.o. female with past medical history of ADHD, anxiety, depression, polysubstance abuse that presents the emergency department today for bilateral leg swelling and pain.  Patient states that she started having some leg swelling last week, t they started turning red a couple days ago.  Patient states that she did have Covid vaccine 2 days prior to before when her leg started swelling.  Denies any history of heart failure.  Denies any fevers, nausea, vomiting.  Is not a diabetic.  Denies any numbness or tingling, states that is painful to walk.  Denies any sick contacts.  Denies any history of clotting disorder, recent surgery, history of cancer, prior to pregnancy, recent long travel.  Has not tried anything for this.  Denies any injury to this area.  States that she has had previous leg swelling in the past, normally comes and gets Lasix and swelling resolves.  States that swelling has not lasted this long.  States that she has been trying over-the-counter fluid pills without any relief.  Unsure why she continuously does get leg swelling.  Denies any chest pain or shortness of breath.  Denies any swelling elsewhere, no abdominal distention.  No alcohol use.  Does smoke cigarettes daily.  HPI     Past Medical History:  Diagnosis Date  . ADHD (attention deficit hyperactivity disorder)   . Anxiety   . Depression   . Gastroparesis 01/2017  . Polysubstance abuse (HCC)    herion, methadone, etoh    Patient Active Problem List   Diagnosis Date Noted  . Substance induced mood disorder (HCC) 07/27/2019  . Bipolar 1 disorder (HCC) 07/09/2019  . Cocaine use disorder, moderate, dependence (HCC) 11/19/2017  . Bipolar 1 disorder, mixed, moderate (HCC) 11/05/2017  . MDD (major  depressive disorder), severe (HCC) 11/04/2017  . Opioid use disorder, severe, dependence (HCC) 08/07/2017  . Opioid use disorder 08/04/2017  . Depression     Past Surgical History:  Procedure Laterality Date  . CHOLECYSTECTOMY    . TUBAL LIGATION    . TUBAL LIGATION       OB History    Gravida  3   Para  2   Term  2   Preterm      AB  1   Living        SAB      TAB  1   Ectopic      Multiple      Live Births              Family History  Problem Relation Age of Onset  . Pneumonia Other   . Depression Mother     Social History   Tobacco Use  . Smoking status: Current Every Day Smoker    Packs/day: 1.00    Years: 15.00    Pack years: 15.00    Types: Cigarettes  . Smokeless tobacco: Never Used  Vaping Use  . Vaping Use: Never used  Substance Use Topics  . Alcohol use: Not Currently    Alcohol/week: 8.0 standard drinks    Types: 8 Cans of beer per week    Comment: over 6 months   . Drug use: Not Currently    Types: Methamphetamines, IV    Comment: used heroin  and crystal meth 24 hours ago     Home Medications Prior to Admission medications   Medication Sig Start Date End Date Taking? Authorizing Provider  divalproex (DEPAKOTE) 500 MG DR tablet Take 1 tablet (500 mg total) by mouth every 12 (twelve) hours. For mood stabilization Patient not taking: Reported on 01/17/2020 07/27/19   Armandina Stammer I, NP  doxycycline (VIBRAMYCIN) 100 MG capsule Take 1 capsule (100 mg total) by mouth 2 (two) times daily. 01/17/20   Elpidio Anis, PA-C  gabapentin (NEURONTIN) 300 MG capsule Take 1 capsule (300 mg total) by mouth 3 (three) times daily. For agitation Patient not taking: Reported on 01/17/2020 07/27/19   Armandina Stammer I, NP  hydrOXYzine (ATARAX/VISTARIL) 25 MG tablet Take 1 tablet (25 mg total) by mouth every 8 (eight) hours as needed for anxiety. Patient not taking: Reported on 01/17/2020 07/27/19   Armandina Stammer I, NP  mirtazapine (REMERON) 15 MG tablet Take 1  tablet (15 mg total) by mouth at bedtime. Patient not taking: Reported on 01/17/2020 07/27/19   Armandina Stammer I, NP  naltrexone (DEPADE) 50 MG tablet Take 1 tablet (50 mg total) by mouth daily. For opioid addiction Patient not taking: Reported on 01/17/2020 07/28/19   Armandina Stammer I, NP  OLANZapine (ZYPREXA) 10 MG tablet Take 1 tablet (10 mg total) by mouth at bedtime. For mood control Patient not taking: Reported on 01/17/2020 07/27/19   Armandina Stammer I, NP  ondansetron (ZOFRAN ODT) 8 MG disintegrating tablet 8mg  ODT q4 hours prn nausea 03/16/20   03/18/20, MD  pantoprazole (PROTONIX) 40 MG tablet Take 1 tablet (40 mg total) by mouth daily. For acid reflux Patient not taking: Reported on 01/17/2020 07/28/19   14/10/20 I, NP  traZODone (DESYREL) 100 MG tablet Take 1 tablet (100 mg total) by mouth at bedtime as needed for sleep. Patient not taking: Reported on 01/17/2020 07/27/19   14/9/20 I, NP    Allergies    Toradol [ketorolac tromethamine]  Review of Systems   Review of Systems  Constitutional: Negative for chills, diaphoresis, fatigue and fever.  HENT: Negative for congestion, sore throat and trouble swallowing.   Eyes: Negative for pain and visual disturbance.  Respiratory: Negative for cough, shortness of breath and wheezing.   Cardiovascular: Positive for leg swelling. Negative for chest pain and palpitations.  Gastrointestinal: Negative for abdominal distention, abdominal pain, diarrhea, nausea and vomiting.  Genitourinary: Negative for difficulty urinating.  Musculoskeletal: Negative for back pain, neck pain and neck stiffness.  Skin: Negative for pallor.  Neurological: Negative for dizziness, speech difficulty, weakness and headaches.  Psychiatric/Behavioral: Negative for confusion.    Physical Exam Updated Vital Signs BP 100/61 (BP Location: Right Arm)   Pulse 62   Temp (!) 97.5 F (36.4 C) (Oral)   Resp 20   Ht 5\' 7"  (1.702 m)   Wt 72 kg   LMP 05/16/2020   SpO2 100%    BMI 24.86 kg/m   Physical Exam Constitutional:      General: She is not in acute distress.    Appearance: Normal appearance. She is not ill-appearing, toxic-appearing or diaphoretic.  HENT:     Mouth/Throat:     Mouth: Mucous membranes are moist.     Pharynx: Oropharynx is clear.  Eyes:     General: No scleral icterus.    Extraocular Movements: Extraocular movements intact.     Pupils: Pupils are equal, round, and reactive to light.  Cardiovascular:     Rate and  Rhythm: Normal rate and regular rhythm.     Pulses: Normal pulses.     Heart sounds: Normal heart sounds.  Pulmonary:     Effort: Pulmonary effort is normal. No respiratory distress.     Breath sounds: Normal breath sounds. No stridor. No wheezing, rhonchi or rales.  Chest:     Chest wall: No tenderness.  Abdominal:     General: Abdomen is flat. There is no distension.     Palpations: Abdomen is soft.     Tenderness: There is no abdominal tenderness. There is no guarding or rebound.  Musculoskeletal:        General: Tenderness present. No swelling. Normal range of motion.     Cervical back: Normal range of motion and neck supple. No rigidity.     Right lower leg: Edema present.     Left lower leg: Edema present.     Comments: Patient with 4+ pitting edema to bilateral lower extremities with erythema and warmth throughout.  Very tender to touch.  Normal strength and sensation of lower extremity including entire leg and foot.  Normal cap refill, foot is warm.  Edema does not extend past the shin.  See picture.  Skin:    General: Skin is warm and dry.     Capillary Refill: Capillary refill takes less than 2 seconds.     Coloration: Skin is not pale.  Neurological:     General: No focal deficit present.     Mental Status: She is alert and oriented to person, place, and time.  Psychiatric:        Mood and Affect: Mood normal.        Behavior: Behavior normal.       ED Results / Procedures / Treatments    Labs (all labs ordered are listed, but only abnormal results are displayed) Labs Reviewed  CBC WITH DIFFERENTIAL/PLATELET - Abnormal; Notable for the following components:      Result Value   RBC 3.34 (*)    Hemoglobin 9.8 (*)    HCT 32.4 (*)    All other components within normal limits  COMPREHENSIVE METABOLIC PANEL - Abnormal; Notable for the following components:   Potassium 3.1 (*)    BUN 5 (*)    Creatinine, Ser 1.09 (*)    Albumin 3.2 (*)    All other components within normal limits  BRAIN NATRIURETIC PEPTIDE  I-STAT BETA HCG BLOOD, ED ([facility], [facility], [facility] ONLY)    EKG None  Radiology No results found.  Procedures Procedures (including critical care time)  Medications Ordered in ED Medications  fentaNYL (SUBLIMAZE) injection 50 mcg (has no administration in time range)  sodium chloride 0.9 % bolus 500 mL (has no administration in time range)  potassium chloride SA (KLOR-CON) CR tablet 40 mEq (has no administration in time range)    ED Course  I have reviewed the triage vital signs and the nursing notes.  Pertinent labs & imaging results that were available during my care of the patient were reviewed by me and considered in my medical decision making (see chart for details).    MDM Rules/Calculators/A&P                         Leva L Lorella NimrodHarvey is a 44 y.o. female with past medical history of ADHD, anxiety, depression, polysubstance abuse that presents the emergency department today for bilateral leg swelling and pain.  Differential to include cellulitis, bilateral DVT, CHF, venous  stasis.  Initial interventions fentanyl and 500 mL bolus due to soft pressures.  Patient is afebrile, nontachycardic.  Plain films of extremities to rule out gas, ultrasound to rule out clot and BMP ordered.  Labs demonstrated no leukocytosis, hemoglobin 9.8.  This does appear to be downtrending in the past 2 months, will have patient follow-up with PCP. Potassium 3.1, will replete this in the  ER.  Was informed by nursing that patient wanted to leave AMA.  Tried to reason with patient to stay, she said she would stay, while in x-ray x-ray tech notified me that patient was yelling and cussing out everyone, nursing then informed me that she started cussing out her, states that she wanted to leave AMA again.  Was not able to discuss options for antibiotics or medication treatment.  Did tell patient risks of leaving including if this was cellulitis she could become septic from it or if this was a clot the clot could travel up to her lungs, patient is aware.  Told patient that she can come back to the emergency department whenever she needs and expressed that this is extremely important.  Patient still wanted to leave AGAINST MEDICAL ADVICE.  Patient did not receive potassium, was unable to talk to patient about hemoglobin.  The plan for this patient was discussed with Dr. Rosalia Hammers, who voiced agreement and who oversaw evaluation and treatment of this patient.   Final Clinical Impression(s) / ED Diagnoses Final diagnoses:  Bilateral edema of lower extremity    Rx / DC Orders ED Discharge Orders    None       Farrel Gordon, PA-C 06/05/20 1024    Margarita Grizzle, MD 06/05/20 1152

## 2020-06-05 NOTE — Discharge Instructions (Addendum)
You are leaving AGAINST MEDICAL ADVICE, please feel free to come back to the emergency department.  As we discussed you could have cellulitis, we needed further testing done.  Please come back to the emergency department or follow-up with your primary care, as you might need antibiotics and if this infection spreads you could die from this.

## 2020-06-05 NOTE — ED Notes (Signed)
PT returned to ED from radiology using cell phone in bed. RN attempted to start IV but pt began cursing me out again and refused. This RN informed patient she is not to speak to staff in this manner. Pt stated she was leaving AMA and refusing to sign AMA form. PA made aware again. PT provided with bus pass by [employer] operational officer. PT ambulatory out of dept.

## 2020-06-05 NOTE — ED Notes (Signed)
PA to bedside and speak with pt. PT states she will stay if she can use phone before going to xray. Pt provided with phone.

## 2020-06-05 NOTE — ED Notes (Signed)
Pt angry and cursing out radiology staff and RN because she is still on the phone and does not want to hang up to go to xray. Pt screaming "just take me god da**" in hall. Pt taken to xray by transport.

## 2020-06-05 NOTE — ED Notes (Signed)
Pt cursing out staff and refusing xray at this time. PT upset she has been waiting for 12 hours and needs to use the phone prior to going to xray so she can call [employer]. I informed patient we could get the phone for her but she had not asked for it. Patient states she just wants to leave AMA now. PA aware and to speak with pt

## 2020-09-01 ENCOUNTER — Emergency Department (HOSPITAL_COMMUNITY)
Admission: EM | Admit: 2020-09-01 | Discharge: 2020-09-01 | Disposition: A | Discharge status: Home / Self Care | Payer: Self-pay | Attending: Emergency Medicine | Admitting: Emergency Medicine

## 2020-09-01 ENCOUNTER — Other Ambulatory Visit: Payer: Self-pay

## 2020-09-01 ENCOUNTER — Emergency Department (HOSPITAL_COMMUNITY): Payer: Self-pay

## 2020-09-01 ENCOUNTER — Encounter (HOSPITAL_COMMUNITY): Payer: Self-pay | Admitting: Emergency Medicine

## 2020-09-01 DIAGNOSIS — R569 Unspecified convulsions: Secondary | ICD-10-CM | POA: Insufficient documentation

## 2020-09-01 DIAGNOSIS — F17210 Nicotine dependence, cigarettes, uncomplicated: Secondary | ICD-10-CM | POA: Insufficient documentation

## 2020-09-01 LAB — BASIC METABOLIC PANEL
Anion gap: 8 (ref 5–15)
BUN: 9 mg/dL (ref 6–20)
CO2: 29 mmol/L (ref 22–32)
Calcium: 9.4 mg/dL (ref 8.9–10.3)
Chloride: 95 mmol/L — ABNORMAL LOW (ref 98–111)
Creatinine, Ser: 0.56 mg/dL (ref 0.44–1.00)
GFR, Estimated: >60 mL/min (ref >60)
Glucose, Bld: 87 mg/dL (ref 70–99)
Potassium: 4.5 mmol/L (ref 3.5–5.1)
Sodium: 132 mmol/L — ABNORMAL LOW (ref 135–145)

## 2020-09-01 LAB — CBC
HCT: 37.2 % (ref 36.0–46.0)
Hemoglobin: 11.6 g/dL — ABNORMAL LOW (ref 12.0–15.0)
MCH: 27.7 pg (ref 26.0–34.0)
MCHC: 31.2 g/dL (ref 30.0–36.0)
MCV: 88.8 fL (ref 80.0–100.0)
Platelets: 114 10*3/uL — ABNORMAL LOW (ref 150–400)
RBC: 4.19 MIL/uL (ref 3.87–5.11)
RDW: 16.4 % — ABNORMAL HIGH (ref 11.5–15.5)
WBC: 7 10*3/uL (ref 4.0–10.5)
nRBC: 0 % (ref 0.0–0.2)

## 2020-09-01 LAB — I-STAT BETA HCG BLOOD, ED (MC, WL, AP ONLY): I-stat hCG, quantitative: <5 m[IU]/mL (ref <5)

## 2020-09-01 LAB — VALPROIC ACID LEVEL: Valproic Acid Lvl: 18 ug/mL — ABNORMAL LOW (ref 50.0–100.0)

## 2020-09-01 MED ORDER — ACETAMINOPHEN 500 MG PO TABS
1000.0000 mg | ORAL_TABLET | Freq: Once | ORAL | Status: AC
  Administered 2020-09-01: 1000 mg via ORAL
  Filled 2020-09-01: qty 2

## 2020-09-01 MED ORDER — LEVETIRACETAM 500 MG PO TABS
500.0000 mg | ORAL_TABLET | Freq: Once | ORAL | Status: AC
  Administered 2020-09-01: 500 mg via ORAL
  Filled 2020-09-01: qty 1

## 2020-09-01 MED ORDER — LEVETIRACETAM 500 MG PO TABS: 500.0000 mg | ORAL_TABLET | Freq: Two times a day (BID) | ORAL | 1 refills | Status: AC

## 2020-09-01 MED ORDER — LEVETIRACETAM 500 MG PO TABS
500.0000 mg | ORAL_TABLET | Freq: Two times a day (BID) | ORAL | 1 refills | Status: DC
Start: 2020-09-01 — End: 2020-09-01

## 2020-09-01 NOTE — Discharge Instructions (Signed)
Please take the medication called Keppra twice a day, this will help prevent seizures You will need to be seen by the neurologist, call for the next available appointment, you will need an EEG You are not legally allowed to drive a motor vehicle until you have been cleared by a neurologist after having a seizure ER for worsening symptoms

## 2020-09-01 NOTE — ED Triage Notes (Signed)
Pt to triage via [facility] from [facility].  Pt was found in her jail cell lying on her side with limbs shaking.  After shaking stopped she was altered.  No history of seizures.  Pt following commands on EMS arrival.  Pupils dilated and reactive to light.  Reports neck pain and back pain.  C-collar in place by EMS.

## 2020-09-01 NOTE — ED Provider Notes (Signed)
[facility] Provider Note   CSN: [id number] Arrival date & time: 09/01/20  1455     History Chief Complaint  Patient presents with  . ? seizure    Amanda Carrillo is a 45 y.o. female.  HPI   This patient is a 45 year old female, she has a history of polysubstance abuse including opiates, methadone, heroin, alcohol and multiple other substances.  [facility] [place] [facility] [place] [facility]  Tonight she was found in the jail laying on the floor, there was some shaking motion seen of her arms and legs and then she had a post ictal.,  She had dilated pupils and has gradually got back to her self.  The patient reports that she has a residual headache, she remembers going to the bathroom just before this happened, then she remembers waking up with nursing and other support staff around her.  She does report that she possibly had a seizure in the past, I do recall seeing in the medical record that this patient had a CT scan of the brain in 2020 which did not show any signs of abnormalities, that was after a traumatic event.  She reports that she had drank quite a bit of alcohol prior to being incarcerated but that was almost 2 months ago.  She successfully went through several days of withdrawal and then never had any other symptoms.  She is not currently using any substances in the jail.  She does take medications including Depakote which she states she takes as a mood stabilizer.  She denies any recent head injuries or concussions, she has had a normal appetite and taking normal p.o. intake  Past Medical History:  Diagnosis Date  . ADHD (attention deficit hyperactivity disorder)   . Anxiety   . Depression   . Gastroparesis 01/2017  . Polysubstance abuse (HCC)    herion, methadone, etoh    Patient Active Problem List   Diagnosis Date Noted  . Substance induced mood disorder (HCC) 07/27/2019  . Bipolar 1 disorder  (HCC) 07/09/2019  . Cocaine use disorder, moderate, dependence (HCC) 11/19/2017  . Bipolar 1 disorder, mixed, moderate (HCC) 11/05/2017  . MDD (major depressive disorder), severe (HCC) 11/04/2017  . Opioid use disorder, severe, dependence (HCC) 08/07/2017  . Opioid use disorder 08/04/2017  . Depression     Past Surgical History:  Procedure Laterality Date  . CHOLECYSTECTOMY    . TUBAL LIGATION    . TUBAL LIGATION       OB History    Gravida  3   Para  2   Term  2   Preterm      AB  1   Living        SAB      IAB  1   Ectopic      Multiple      Live Births              Family History  Problem Relation Age of Onset  . Pneumonia Other   . Depression Mother     Social History   Tobacco Use  . Smoking status: Current Every Day Smoker    Packs/day: 1.00    Years: 15.00    Pack years: 15.00    Types: Cigarettes  . Smokeless tobacco: Never Used  Vaping Use  . Vaping Use: Never used  Substance Use Topics  . Alcohol use: Not Currently    Alcohol/week: 8.0  standard drinks    Types: 8 Cans of beer per week    Comment: over 6 months   . Drug use: Not Currently    Types: Methamphetamines, IV    Comment: used heroin and crystal meth 24 hours ago     Home Medications Prior to Admission medications   Medication Sig Start Date End Date Taking? Authorizing Provider  divalproex (DEPAKOTE) 500 MG DR tablet Take 1 tablet (500 mg total) by mouth every 12 (twelve) hours. For mood stabilization Patient not taking: Reported on 01/17/2020 07/27/19   Armandina Stammer I, NP  doxycycline (VIBRAMYCIN) 100 MG capsule Take 1 capsule (100 mg total) by mouth 2 (two) times daily. 01/17/20   Elpidio Anis, PA-C  gabapentin (NEURONTIN) 300 MG capsule Take 1 capsule (300 mg total) by mouth 3 (three) times daily. For agitation Patient not taking: Reported on 01/17/2020 07/27/19   Armandina Stammer I, NP  hydrOXYzine (ATARAX/VISTARIL) 25 MG tablet Take 1 tablet (25 mg total) by mouth every 8  (eight) hours as needed for anxiety. Patient not taking: Reported on 01/17/2020 07/27/19   Armandina Stammer I, NP  levETIRAcetam (KEPPRA) 500 MG tablet Take 1 tablet (500 mg total) by mouth 2 (two) times daily. 09/01/20   Eber Hong, MD  mirtazapine (REMERON) 15 MG tablet Take 1 tablet (15 mg total) by mouth at bedtime. Patient not taking: Reported on 01/17/2020 07/27/19   Armandina Stammer I, NP  naltrexone (DEPADE) 50 MG tablet Take 1 tablet (50 mg total) by mouth daily. For opioid addiction Patient not taking: Reported on 01/17/2020 07/28/19   Armandina Stammer I, NP  OLANZapine (ZYPREXA) 10 MG tablet Take 1 tablet (10 mg total) by mouth at bedtime. For mood control Patient not taking: Reported on 01/17/2020 07/27/19   Armandina Stammer I, NP  ondansetron (ZOFRAN ODT) 8 MG disintegrating tablet 8mg  ODT q4 hours prn nausea 03/16/20   03/18/20, MD  pantoprazole (PROTONIX) 40 MG tablet Take 1 tablet (40 mg total) by mouth daily. For acid reflux Patient not taking: Reported on 01/17/2020 07/28/19   14/10/20 I, NP  traZODone (DESYREL) 100 MG tablet Take 1 tablet (100 mg total) by mouth at bedtime as needed for sleep. Patient not taking: Reported on 01/17/2020 07/27/19   14/9/20, NP    Allergies    Toradol [ketorolac tromethamine]  Review of Systems   Review of Systems  All other systems reviewed and are negative.   Physical Exam Updated Vital Signs BP 112/77   Pulse 65   Temp 98.4 F (36.9 C) (Oral)   Resp 16   SpO2 100%   Physical Exam Vitals and nursing note reviewed.  Constitutional:      General: She is not in acute distress.    Appearance: She is well-developed and well-nourished.  HENT:     Head: Normocephalic and atraumatic.     Nose: Nose normal. No congestion or rhinorrhea.     Mouth/Throat:     Mouth: Oropharynx is clear and moist.     Pharynx: No oropharyngeal exudate.  Eyes:     General: No scleral icterus.       Right eye: No discharge.        Left eye: No discharge.      Extraocular Movements: EOM normal.     Conjunctiva/sclera: Conjunctivae normal.     Pupils: Pupils are equal, round, and reactive to light.  Neck:     Thyroid: No thyromegaly.     Vascular: No  JVD.  Cardiovascular:     Rate and Rhythm: Normal rate and regular rhythm.     Pulses: Intact distal pulses.     Heart sounds: Normal heart sounds. No murmur heard. No friction rub. No gallop.   Pulmonary:     Effort: Pulmonary effort is normal. No respiratory distress.     Breath sounds: Normal breath sounds. No wheezing or rales.  Abdominal:     General: Bowel sounds are normal. There is no distension.     Palpations: Abdomen is soft. There is no mass.     Tenderness: There is no abdominal tenderness.  Musculoskeletal:        General: No tenderness or edema. Normal range of motion.     Cervical back: Normal range of motion and neck supple.  Lymphadenopathy:     Cervical: No cervical adenopathy.  Skin:    General: Skin is warm and dry.     Findings: No erythema or rash.  Neurological:     Mental Status: She is alert.     Coordination: Coordination normal.     Comments: Neurologic exam:  Speech clear, pupils equal round reactive to light, extraocular movements intact  Normal peripheral visual fields Cranial nerves III through XII normal including no facial droop Follows commands, moves all extremities x4, normal strength to bilateral upper and lower extremities at all major muscle groups including grip Sensation normal to light touch and pinprick Coordination intact, no limb ataxia   Psychiatric:        Mood and Affect: Mood and affect normal.        Behavior: Behavior normal.     ED Results / Procedures / Treatments   Labs (all labs ordered are listed, but only abnormal results are displayed) Labs Reviewed  BASIC METABOLIC PANEL - Abnormal; Notable for the following components:      Result Value   Sodium 132 (*)    Chloride 95 (*)    All other components within normal limits   CBC - Abnormal; Notable for the following components:   Hemoglobin 11.6 (*)    RDW 16.4 (*)    Platelets 114 (*)    All other components within normal limits  VALPROIC ACID LEVEL - Abnormal; Notable for the following components:   Valproic Acid Lvl 18 (*)    All other components within normal limits  I-STAT BETA HCG BLOOD, ED ([facility], [facility], [facility] ONLY)  CBG MONITORING, ED    EKG EKG Interpretation  Date/Time:  Saturday September 01 2020 15:03:39 EST Ventricular Rate:  77 PR Interval:  130 QRS Duration: 78 QT Interval:  372 QTC Calculation: 420 R Axis:   76 Text Interpretation: Normal sinus rhythm with sinus arrhythmia Normal ECG Confirmed by Eber Hong ([id number]) on 09/01/2020 9:01:20 PM   Radiology CT Head Wo Contrast  Result Date: 09/01/2020 CLINICAL DATA:  Seizure. EXAM: CT HEAD WITHOUT CONTRAST TECHNIQUE: Contiguous axial images were obtained from the base of the skull through the vertex without intravenous contrast. COMPARISON:  July 06, 2019 FINDINGS: Brain: No evidence of acute infarction, hemorrhage, hydrocephalus, extra-axial collection or mass lesion/mass effect. Vascular: No hyperdense vessel or unexpected calcification. Skull: Normal. Negative for fracture or focal lesion. Sinuses/Orbits: No acute finding. Other: None. IMPRESSION: No acute intracranial pathology. Electronically Signed   By: Aram Candela M.D.   On: 09/01/2020 21:57    Procedures Procedures (including critical care time)  Medications Ordered in ED Medications  levETIRAcetam (KEPPRA) tablet 500 mg (500 mg Oral Given 09/01/20  2124)  acetaminophen (TYLENOL) tablet 1,000 mg (1,000 mg Oral Given 09/01/20 2152)    ED Course  I have reviewed the triage vital signs and the nursing notes.  Pertinent labs & imaging results that were available during my care of the patient were reviewed by me and considered in my medical decision making (see chart for details).    MDM Rules/Calculators/A&P                           It is unclear exactly what prompted the seizure however given the patient has had a couple of seizures in the past we will do a CT scan of the brain start her on Keppra and check a Depakote level.  She will need to follow-up with neurology for an EEG.  She has a normal mental status at this time as well as her neurologic exam and vital signs.  Vitals:   09/01/20 1803 09/01/20 2046  BP: 116/76 112/77  Pulse: 76 65  Resp: 16 16  Temp: 98.4 F (36.9 C)   SpO2: 99% 100%   No further seizures in the emergency department, she has maintained a normal mental status has had a full meal has had a negative CT scan and labs are unremarkable.  She is stable for discharge, Keppra was given in the emergency department, prescription given for home, referral to neurology given, patient understands indications for return, indications for follow-up and the need to not drive legally until cleared by neurology  Final Clinical Impression(s) / ED Diagnoses Final diagnoses:  Seizure [facility])    Rx / DC Orders ED Discharge Orders         Ordered    levETIRAcetam (KEPPRA) 500 MG tablet  2 times daily,   Status:  Discontinued        09/01/20 2239    levETIRAcetam (KEPPRA) 500 MG tablet  2 times daily        09/01/20 2239           Eber Hong, MD 09/01/20 2241

## 2021-06-12 ENCOUNTER — Ambulatory Visit (HOSPITAL_COMMUNITY)
Admission: RE | Admit: 2021-06-12 | Discharge: 2021-06-12 | Disposition: A | Discharge status: Home / Self Care | Payer: Self-pay | Attending: Psychiatry | Admitting: Psychiatry

## 2021-06-12 DIAGNOSIS — F1994 Other psychoactive substance use, unspecified with psychoactive substance-induced mood disorder: Secondary | ICD-10-CM

## 2021-06-12 NOTE — H&P (Signed)
Behavioral Health Medical Screening Exam  Amanda Carrillo is a 45 y.o. female seen by this provider with TTS counselor as voluntary walk-in at [facility] accompanied by [facility]. Amanda Carrillo was not present during assessment.   History of bipolar, anxiety, PTSD, and upon chart review had seizure in January 2022 while in jail. Negative CT head at that time. Previous inpatient psychiatric admissions include November 2020 and December 2020. Patient is not certain about having a seizure in January when questioned.   Patient reports that "I need help getting off drugs and with my mental health"  "I am tired"  Patient is depressed with congruent affect and tearful during interview. Endorses suicidal ideation with intent to overdose on whatever she can take to die in her sleep, denies homicidal ideation, denies auditory and visual hallucinations. Endorses substance use off and on for 20 years. Last used crack and heroin this morning "not that much"  Lives with friend, Amanda Carrillo, who is supportive of her.  Goal of this visit it to get detox and to get "my shit together"    Explained to patient that in order for [place] to admit to psychiatry, she will need to go to the emergency department for medical clearance with her recent substance use. She got up from bench and left the room. She was standing at lobby door and someone opened the door and she left the premises. Security guard attempted to have patient stay and she would not stop.       Total Time spent with patient: 45 minutes  Psychiatric Specialty Exam:  Presentation  General Appearance: Appropriate for Environment Eye Contact:Fair Speech:Normal Rate Speech Volume:Normal Handedness:No data recorded  Mood and Affect  Mood:Depressed; Anxious Affect:Congruent  Thought Process  Thought Processes:Coherent Descriptions of Associations:Intact Orientation:Full (Time, Place and Person) Thought Content:Logical History of Schizophrenia/Schizoaffective  disorder:No data recorded Duration of Psychotic Symptoms:No data recorded Hallucinations:Hallucinations: None Ideas of Reference:None Suicidal Thoughts:Suicidal Thoughts: Yes, Passive SI Passive Intent and/or Plan: With Intent; With Plan; With Access to Means Homicidal Thoughts:Homicidal Thoughts: No  Sensorium  Memory:Immediate Good; Recent Good; Remote Good Judgment:Fair Insight:Fair  Executive Functions  Concentration:Fair Attention Span:Fair Recall:Good Fund of Knowledge:Good Language:Good  Psychomotor Activity  Psychomotor Activity:Psychomotor Activity: Normal  Assets  Assets:Communication Skills; Desire for Improvement; Housing; Intimacy; Physical Health; Social Support  Sleep  Sleep:Sleep: Poor   Physical Exam: Physical Exam Vitals reviewed.  Constitutional:      General: She is not in acute distress. Cardiovascular:     Rate and Rhythm: Normal rate.  Pulmonary:     Effort: Pulmonary effort is normal.  Skin:    General: Skin is warm and dry.  Neurological:     Mental Status: She is alert and oriented to person, place, and time.  Psychiatric:        Attention and Perception: She does not perceive auditory or visual hallucinations.        Mood and Affect: Mood is depressed.        Behavior: Behavior is cooperative.        Thought Content: Thought content is not paranoid or delusional. Thought content includes suicidal ideation. Thought content does not include homicidal ideation. Thought content includes suicidal plan. Thought content does not include homicidal plan.        Judgment: Judgment is impulsive.   Review of Systems  Constitutional:  Negative for chills and fever.  Respiratory:  Negative for shortness of breath.   Cardiovascular:  Negative for chest pain.  Gastrointestinal:  Negative  for abdominal pain.  Neurological:  Negative for headaches.  Psychiatric/Behavioral:  Positive for substance abuse and suicidal ideas. Negative for depression and  hallucinations. The patient has insomnia. The patient is not nervous/anxious.   Blood pressure 103/76, pulse 92, temperature 98.6 F (37 C), temperature source Oral, resp. rate 18, SpO2 98 %. There is no height or weight on file to calculate BMI.  Musculoskeletal: Strength & Muscle Tone: within normal limits Gait & Station: normal Patient leans: N/A   Recommendations:  Based on my evaluation the patient appears to have an emergency medical condition for which I recommend the patient be transferred to the emergency department for further evaluation. Patient got up during explanation that going to the emergency department for medical clearance is what is recommended before she can be treated for mental health. This patient left [facility] against medical advice.   Novella Olive, NP 06/12/2021, 4:22 PM
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
## 2021-06-13 ENCOUNTER — Emergency Department (HOSPITAL_COMMUNITY)
Admission: EM | Admit: 2021-06-13 | Discharge: 2021-06-13 | Disposition: A | Discharge status: Home / Self Care | Payer: Self-pay | Attending: Emergency Medicine | Admitting: Emergency Medicine

## 2021-06-13 ENCOUNTER — Other Ambulatory Visit: Payer: Self-pay

## 2021-06-13 DIAGNOSIS — F1123 Opioid dependence with withdrawal: Secondary | ICD-10-CM | POA: Insufficient documentation

## 2021-06-13 DIAGNOSIS — Z5321 Procedure and treatment not carried out due to patient leaving prior to being seen by health care provider: Secondary | ICD-10-CM | POA: Insufficient documentation

## 2021-06-13 NOTE — ED Notes (Signed)
Patient currently screaming in lobby. Patient screaming and cursing at staff and other patients. Security aware of the situation. Patient screaming "I want a fucking bed, Give me a fucking bed, I need some fucking help." Staff asked patient to not curse loudly. Patient continuing to scream and curse at staff.

## 2021-06-13 NOTE — ED Notes (Signed)
Pt seen leaving earlier and not returning, also has been called X2 for vitals check with no response

## 2021-06-13 NOTE — ED Triage Notes (Signed)
Pt here PO(V with c/o of heroin withdraw. Pt last use, 06/12/21. Pt states she wants help.
